# Patient Record
Sex: Male | Born: 1960 | Race: White | Hispanic: No | Marital: Single | Smoking: Former smoker
Health system: Southern US, Community
[De-identification: ages and names within clinical notes are randomized; demographics above are authoritative.]

## PROBLEM LIST (undated history)

## (undated) ENCOUNTER — Emergency Department (HOSPITAL_COMMUNITY): Discharge status: Absent without leave | Payer: Medicare Other

## (undated) DIAGNOSIS — N3090 Cystitis, unspecified without hematuria: Secondary | ICD-10-CM

## (undated) DIAGNOSIS — F99 Mental disorder, not otherwise specified: Secondary | ICD-10-CM

## (undated) DIAGNOSIS — D649 Anemia, unspecified: Secondary | ICD-10-CM

## (undated) DIAGNOSIS — R1310 Dysphagia, unspecified: Secondary | ICD-10-CM

## (undated) DIAGNOSIS — F259 Schizoaffective disorder, unspecified: Secondary | ICD-10-CM

## (undated) DIAGNOSIS — F202 Catatonic schizophrenia: Secondary | ICD-10-CM

## (undated) DIAGNOSIS — F319 Bipolar disorder, unspecified: Secondary | ICD-10-CM

## (undated) DIAGNOSIS — E876 Hypokalemia: Secondary | ICD-10-CM

## (undated) DIAGNOSIS — N400 Enlarged prostate without lower urinary tract symptoms: Secondary | ICD-10-CM

## (undated) HISTORY — DX: Catatonic schizophrenia: F20.2

## (undated) HISTORY — PX: TONSILLECTOMY: SUR1361

---

## 1999-02-20 ENCOUNTER — Inpatient Hospital Stay (HOSPITAL_COMMUNITY): Admission: AD | Admit: 1999-02-20 | Discharge: 1999-02-24 | Payer: Self-pay | Admitting: *Deleted

## 2000-02-26 ENCOUNTER — Emergency Department (HOSPITAL_COMMUNITY): Admission: EM | Admit: 2000-02-26 | Discharge: 2000-02-26 | Payer: Self-pay | Admitting: Emergency Medicine

## 2000-06-30 ENCOUNTER — Emergency Department (HOSPITAL_COMMUNITY): Admission: EM | Admit: 2000-06-30 | Discharge: 2000-07-01 | Payer: Self-pay | Admitting: Emergency Medicine

## 2000-07-03 ENCOUNTER — Emergency Department (HOSPITAL_COMMUNITY): Admission: EM | Admit: 2000-07-03 | Discharge: 2000-07-03 | Payer: Self-pay | Admitting: Emergency Medicine

## 2000-08-05 ENCOUNTER — Inpatient Hospital Stay (HOSPITAL_COMMUNITY): Admission: EM | Admit: 2000-08-05 | Discharge: 2000-08-29 | Payer: Self-pay | Admitting: *Deleted

## 2001-10-19 ENCOUNTER — Inpatient Hospital Stay (HOSPITAL_COMMUNITY): Admission: AD | Admit: 2001-10-19 | Discharge: 2001-10-30 | Payer: Self-pay | Admitting: Psychiatry

## 2002-08-26 ENCOUNTER — Inpatient Hospital Stay (HOSPITAL_COMMUNITY): Admission: EM | Admit: 2002-08-26 | Discharge: 2002-09-25 | Payer: Self-pay | Admitting: Psychiatry

## 2004-05-08 ENCOUNTER — Emergency Department (HOSPITAL_COMMUNITY): Admission: EM | Admit: 2004-05-08 | Discharge: 2004-05-08 | Payer: Self-pay | Admitting: Emergency Medicine

## 2006-03-30 ENCOUNTER — Inpatient Hospital Stay (HOSPITAL_COMMUNITY): Admission: AD | Admit: 2006-03-30 | Discharge: 2006-04-12 | Payer: Self-pay | Admitting: *Deleted

## 2006-03-31 ENCOUNTER — Ambulatory Visit: Payer: Self-pay | Admitting: *Deleted

## 2006-09-08 ENCOUNTER — Emergency Department (HOSPITAL_COMMUNITY): Admission: EM | Admit: 2006-09-08 | Discharge: 2006-09-08 | Payer: Self-pay | Admitting: Emergency Medicine

## 2006-11-23 ENCOUNTER — Emergency Department (HOSPITAL_COMMUNITY): Admission: EM | Admit: 2006-11-23 | Discharge: 2006-11-23 | Payer: Self-pay | Admitting: Emergency Medicine

## 2007-06-29 ENCOUNTER — Ambulatory Visit: Payer: Self-pay | Admitting: Family Medicine

## 2007-06-29 LAB — CONVERTED CEMR LAB
AST: 15 units/L (ref 0–37)
BUN: 11 mg/dL (ref 6–23)
Basophils Relative: 0 % (ref 0–1)
Calcium: 10 mg/dL (ref 8.4–10.5)
Chloride: 104 meq/L (ref 96–112)
Creatinine, Ser: 0.66 mg/dL (ref 0.40–1.50)
Eosinophils Absolute: 0.2 10*3/uL (ref 0.0–0.7)
HCT: 46.3 % (ref 39.0–52.0)
Hemoglobin: 15.5 g/dL (ref 13.0–17.0)
MCHC: 33.5 g/dL (ref 30.0–36.0)
MCV: 95.1 fL (ref 78.0–100.0)
Monocytes Absolute: 0.6 10*3/uL (ref 0.2–0.7)
Monocytes Relative: 5 % (ref 3–11)
Neutro Abs: 8.8 10*3/uL — ABNORMAL HIGH (ref 1.7–7.7)
RBC: 4.87 M/uL (ref 4.22–5.81)
Valproic Acid Lvl: 59.4 ug/mL (ref 50.0–100.0)

## 2007-09-20 ENCOUNTER — Emergency Department (HOSPITAL_COMMUNITY): Admission: EM | Admit: 2007-09-20 | Discharge: 2007-09-20 | Payer: Self-pay | Admitting: Emergency Medicine
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
## 2011-01-28 NOTE — H&P (Signed)
[facility]  Patient:    Franklin Ayers, MITCHAM Visit Number: [id number] MRN: [id number]          Service Type: PSY Location: 400 0499 [id number] Attending Physician:  Jeanice Lim Dictated by:   Young Berry Scott, N.P. Admit Date:  10/19/2001                     Psychiatric Admission Assessment  DATE OF ADMISSION:  October 19, 2001.  IDENTIFYING INFORMATION:  This is a 50 year old single Caucasian male who is an involuntary admission.  HISTORY OF THE PRESENT ILLNESS:  This patient with a long history of undifferentiated schizophrenia was committed by his mother, who petitioned because the patient has not been eating and not been sleeping and refusing to take his medications.  According to reports to the assessment team, his mother had reported that these symptoms had gradually been getting worse over the past 6 or 8 weeks, but the patient refused to go back on his medications and was generally pacing a lot at home and was behaving quite differently than his usual baseline.  During the commitment process, the patient was gesturing wildly.  His mood seemed somewhat elevated, and his thinking seemed disorganized and he was quite incoherent.  The patient was also tearful when talking about his father and mothers divorce, and his father has now moved away to [place], [place] and he does not get to see him very frequently. Today, the patient denies any suicidal ideation or homicidal ideation.  He is cooperative with the exam, although his mood seems quite elevated.  The patient denies any suicidal ideation or homicidal ideation.  He denies any auditory hallucinations.  PAST PSYCHIATRIC HISTORY:  The patient is followed by [facility] by Dr. Hortencia Pilar.  This is the patients third admission to [facility], with his last one in 2001.  He has a history of schizophrenia, undifferentiated.  SOCIAL HISTORY:  The patient was  educated through some college, at which time he decompensated and had to drop out.  He has his own apartment where he lives, but spends a considerable amount of time with his mother who lives here in town.  Her name is Gabriel Rung and she is supportive of him and assists him with shopping and various activities of daily living.  The patient is single, never married.  He has no children.  FAMILY HISTORY:  Remarkable for an aunt with a history of bipolar illness.  ALCOHOL AND DRUG HISTORY:  The patients mother during the assessment process denied that there had been any current substance abuse.  The patient does drink a beer occasionally and had a beer approximately 2 days ago.  The patient has been smoking increasing amounts lately, generally has been smoking up to about 2 packs per day of cigarettes.  PAST MEDICAL HISTORY:  Patients primary care Kayd Launer is unclear at this time.  No evidence of any medical problems reported and he has no somatic complaints today.  MEDICATIONS:  Depakote ER 1000 mg p.o. q.h.s., Risperdal 3 mg p.o. b.i.d. and Haldol 2 mg q.h.s.  The patient has been noncompliant with medications for an unknown period of time.  DRUG ALLERGIES:  PENICILLIN.  POSITIVE PHYSICAL FINDINGS:  The patients full PE is currently pending.  He has no somatic complaints at this time.  On admission to the unit, his temp is 97.8, pulse 90, respirations 18, blood pressure 134/91.  Patients valproic acid level is less than  10.  His CBC is within normal limits, with a WBC of 10.0, hemoglobin of 16.2, hematocrit 47.8, MCV 92.1, and platelets of 251. The patients metabolic panel is also within normal limits.  Electrolytes are normal.  BUN 18, creatinine 0.9.  SGOT is 15, SGPT 15.  Thyroid panel reveals a TSH of 1.535, within normal limits, and a free T3 which mildly elevated at 4.3.  The patients urine drug screen and routine urinalysis is currently pending.  MENTAL STATUS EXAMINATION:   This is a healthy appearing male who is no acute distress.  He does have some mild motor restlessness and displays pill rolling movements of his left hand constantly throughout the interview.  He has been pacing in the hallway and is wearing a winter coat.  Affect quite bright, and he greets me with a big smile and greets me by name.  The patients speech is rapid and he is fairly constantly talking.  His mood is mildly elevated. Thought process is fairly logical and sequential and he is relatively clear. He has no evidence of suicidal or homicidal ideations today, no auditory or visual hallucinations.  His responses are generally appropriate.  He has difficulty with remembering time frames and states that he has been taking his medications, in contrast to what his mother says.  He is not a very reliable historian.  Cognitively, he is intact and oriented x 3.  ADMISSION DIAGNOSES: Axis I:    1. Schizophrenia, undifferentiated.            2. Rule out schizoaffective disorder. Axis II:   Deferred. Axis III:  Tardive dyskinesia and akathisia. Axis IV:   Moderate problems with the primary support group, with the            patient being stressed and upset over his parents recent            divorce and his fathers move to [place]. Axis V:    Current 38, past year 89.  INITIAL PLAN OF CARE:  Involuntarily admit the patient to treat his agitation and improve his reality testing and evaluate his overall status.  We will discontinue his Haldol at h.s. because of his akathisia and tardive dyskinesia and discontinue his Haldol altogether.  We will instead try him on some Seroquel 100 mg p.o. q.h.s. and that may be repeated x 1, and we will restart his routine dose of Depakote.  Since his valproic acid level was less than 10 it is obvious he has been noncompliant with his medications, and we will evaluate him from there in terms of what we can do to impact his tardive dyskinesia and help him to clear  up and even his mood.  ESTIMATED LENGTH OF STAY:  5 to 6 days. Dictated by:   Young Berry Scott, N.P. Attending Physician:  Jeanice Lim  DD:  10/20/01 TD:  10/20/01 Job: [id number]
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
## 2011-01-28 NOTE — H&P (Signed)
[facility]  Patient:    Franklin Ayers, Franklin Ayers Visit Number: [id number] MRN: [id number]          Service Type: PSY Location: 400 0499 01 Attending Physician:  Jeanice Lim Dictated by:   Young Berry Scott, R.N. N.P. Admit Date:  10/19/2001                           History and Physical  DATE OF EXAMINATION: October 20, 2001, at 3:00 p.m.  REVIEW OF SYSTEMS:  This patient with chronic undifferentiated schizophrenia reports no somatic complaints at this time and he is quite clear and coherent today.  His mother had complained that he had been agitated and pacing, smoking more cigarettes than usual up to two or three packs per day, and had been refusing his medications.  According to the information that we have and from looking at his past medical records, he has no remarkable medical problems in his history.  He does have an allergy to PENICILLIN, apparently. Today he states he gets some muscle stiffness in his right foot; however, he denies any somatic complaints at all today and he denies that he has any insomnia.  He has been denying that he has any noncompliance with his medications, states that he has been taking them regularly.  The patient today denies any history of lung diseases, no history of asthma, wheezing, or shortness of breath.  He denies any chest pain.  He denies any abdominal complaints and states his bowels are regular and his appetite is satisfactory. There has been no apparent weight loss; although at one point there was a report of a 40 pound weight loss he appears to be his normal weight and weight on admission was 170 pounds.  This is in comparison with his admission weight August 05, 2000, of 148 pounds and his discharge weight of 158 pounds on August 25, 2000.  PHYSICAL EXAMINATION:  GENERAL:  This is a generally healthy appearing Caucasian male with a quick smile.  He is dressed inappropriately in a ski jacket and  then later on is wandering around in just a hospital gown in his tennis shoes.  He is wearing his glasses.  Generally his greeting is appropriate.  His breath and person smell of cigarette smoke.  Hygiene is adequate.  VITAL SIGNS:  On admission to the unit, temperature pulse respirations blood pressure  SKIN:  Pale in tone, medium texture, no remarkable lesions or rashes noted.  HEAD:  Normocephalic.  Hair is clipped short and is scattered with gray.  EENT:  The patient does wear corrective lenses.  PERRLA.  Hearing is intact to normal voice.  Oropharynx: Noninjected.  Tongue is midline without fasciculations.  CARDIOVASCULAR:  S1 and S2 heard, no clicks, murmurs, or gallops, regular rate.  LUNGS:  Clear to auscultation, no wheezes heard throughout, no cough evident.  ABDOMEN:  Soft and nontender, generally flat.  GENITALIA:  Deferred.  MUSCULOSKELETAL:  Posture: Upright.  Gait: Generally normal although the patient is pacing considerably.  NEUROLOGIC:  The patient has displayed some repetitive pill rolling movements of his left hand during the exam today and he has some mild motor restlessness.  Cranial nerves II-XII are intact.  EOMs: Intact without nystagmus.  Romberg is without findings.  Deep tendon reflexes are 1+/5 and are generally symmetrical.  EXTREMITIES:  Feet are not examined today because the patient is declining to take his shoes off, wants to get up  and leave now so we have had to cut the exam short.  We will attempt to examine his feet later. Dictated by:   Young Berry Scott, R.N. N.P. Attending Physician:  Jeanice Lim DD:  10/20/01 TD:  10/20/01 Job: [id number]

## 2011-01-28 NOTE — Discharge Summary (Signed)
NAME:  Franklin Ayers, Franklin Ayers NO.:  [id number]   MEDICAL RECORD NO.:  [id number]          PATIENT TYPE:  IPS   LOCATION:  0406                          [facility]ILITY[facility]   PHYSICIAN:  Jasmine Pang, M.D. DATE OF BIRTH:  Sep 02, 1961   DATE OF ADMISSION:  03/30/2006  DATE OF DISCHARGE:  04/12/2006                                 DISCHARGE SUMMARY   STAT DISCHARGE SUMMARY   IDENTIFYING INFORMATION:  Patient is a 50 year old single Caucasian male,  who was admitted on an involuntary basis on 03/30/2006 to my service.   HISTORY OF PRESENT ILLNESS:  According to the history on petition, the paper  stated he was paranoid and agitated, irritable, and dangerous.  Patient  believes people are trying to poison his food, he has been talking to  himself, he has been responding to voices.  He has been noncompliant with  his medication.   PAST PSYCHIATRIC HISTORY:  Patient was here in 2004.  He is seen at the  [facility] by Dr. Hortencia Pilar.   FAMILY HISTORY:  None known.   ALCOHOL HISTORY:  No current alcohol.   DRUG USE:  Unclear.   PAST MEDICAL HISTORY/MEDICAL PROBLEMS:  None.   MEDICATIONS:  1.  Depakote ER 500 mg p.o. b.i.d.  2.  Risperdal 2 mg p.o. q.a.m.  3.  Trazodone 200 mg p.o. q.h.s.   ALLERGIES:  PENICILLIN.   POSITIVE PHYSICAL FINDINGS:  Patient appeared well-nourished, without acute  chronic physical problems.  He allowed a brief physical exam, which was  within normal limits.   LABORATORY DATA:  Patient is refusing labs.   HOSPITAL COURSE:  Upon admission, patient was started on Risperdal M-Tabs 2  mg p.o. now times one dose, then Risperdal M-Tabs 2 mg p.o. q.h.s., Depakote  ER 750 mg p.o. q.h.s., Klonopin 200 mg p.o. q.4h. p.r.n. agitation - he was  ordered a Klonopin 2 mg one time dose now, Cogentin 1 mg p.o. b.i.d.  On  03/30/2006, patient was ordered Geodon 10 mg IM to be used only for  emergency agitation.  On 03/31/2006, patient was started on  Ambien 10 mg  p.o. q.h.s. p.r.n.  On 04/02/2006, patient was started on Haldol 5 mg IM,  may give 1-2 hours; Ativan 2 mg IM times one.  On 04/06/2006, patient was  started on Geodon as a forced med IM now, may repeat times one if still  agitated and manic.  He was also started on Geodon 60 mg p.o. b.i.d.  On  04/12/2006, patient's Risperdal was discontinued.   Throughout most of the hospitalization, patient refused his medications.  He  did initially take some medicines and on one day was so agitated that I was  able to force medications.  However, most of the time here, he has refused  any medication.  He was frequently agitated and angry.  He would pace up and  down the hall.  He was responding to internal stimuli, he was very reserved  and guarded, just one-word answers.  He was very isolative and withdrawn.  This was his affect throughout the hospitalization.  On  04/06/2006, I was  able to force Geodon 20 mg IM times one because he had gotten increasingly  agitated and was pulling staff's paperwork and pulling at staff.  He was  also touching patients and repeating what they said, which was annoying  them.  He was getting more manic and out of control.  He was sedate after  that one dose but still refused medications.  There was no significant  change of patient's mental status during the hospitalization.  There was no  improvement in his mental status as well and patient refused any medications  after the first two days.   DISCHARGE DIAGNOSES:  Axis I:  Schizoaffective disorder, bipolar type.  None.  Axis III:  None.  Axis IV:  Moderate (burden of the illness and other psychosocial problems).  Axis V:  GAF, current was 30; GAF upon admission was 30; GAF highest past  year was 50.   DISCHARGE INSTRUCTIONS:  No specific activity level or dietary restrictions.   DISCHARGE MEDICATIONS:  1.  Depakote ER 250 mg at bedtime.  2.  Cogentin 1 mg p.o. b.i.d.  3.  Geodon 60 mg b.i.d.  4.   Klonopin 2 mg every 4 hours as needed for anxiety.  5.  Ambien 10 mg at bedtime if needed.  However, as indicated above, patient      is refusing all medications.   POST-HOSPITAL CARE PLANS:  [facility] is [facility] for further evaluation and treatment.      Jasmine Pang, M.D.  Electronically Signed     BHS/MEDQ  D:  04/12/2006  T:  04/12/2006  Job:  [id number]
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
## 2011-01-28 NOTE — Discharge Summary (Signed)
[facility]  Patient:    Franklin Ayers, Franklin Ayers Visit Number: [id number] MRN: [id number]          Service Type: PSY Location: 400 [id number] 02 Attending Physician:  Jeanice Lim Dictated by:   Jeanice Lim, M.D. Admit Date:  10/19/2001 Discharge Date: 10/30/2001                             Discharge Summary  IDENTIFYING DATA:  This is a 50 year old single Caucasian male involuntarily admitted with a long history of undifferentiated schizophrenia, petitioned by his mother due to the patient not eating, not sleeping, and being unable to care for himself nor think clearly.  ADMISSION MEDICATIONS:  Depakote ER 1000 mg q.h.s., Risperdal 3 mg b.i.d. and Haldol 2 mg q.h.s.  The patient has been noncompliant with medications for an unknown period of time.  ALLERGIES:  PENICILLIN.  PHYSICAL EXAMINATION:  Essentially within normal limits except for a slightly elevated blood pressure, neurologically nonfocal.  ROUTINE ADMISSION LABS:  Essentially within normal limits, including TSH and liver function.  Urine drug screen was negative.  MENTAL STATUS EXAMINATION:  Healthy-appearing male in no acute distress, with mild psychomotor restlessness, displaying pill rolling movements with the left hand, pacing at times.  Mood mostly indifferent, affect somewhat bright, with a big smile.  Patients speech was somewhat rapid and pressured.  Thought process was mostly goal directed.  Thought content negative for suicidal or homicidal ideation.  The patient denied hallucinations, but appeared to lack insight regarding the severity of mental illness and the need for compliance with medications.  Judgment and insight were limited.  ADMISSION DIAGNOSES: Axis I:    Schizophrenia, undifferentiated. Axis II:   None. Axis III:  Tardive dyskinesia. Axis IV:   Moderate problems with primary support. Axis V:    35/70.  HOSPITAL COURSE:  The patient was admitted and routine  p.r.n. medications were ordered, resumed on Risperdal, Depakote, Haldol, Cogentin, Ativan, and Seroquel p.r.n.  Risperdal was titrated, as well as Depakote to stabilize mood and target psychotic symptoms, and weight was monitored to document that the patient was eating.  The patient was also monitored for p.o. intake.  The patient tolerated medication changes well without side effects, reporting positive response.  CONDITION ON DISCHARGE:  Markedly improved.  Mood more euthymic, affect brighter, thought process goal directed.  Thought content negative for overt psychotic symptoms or dangerous ideation.  The patient reported motivation to be compliant with follow up plan.  DISCHARGE MEDICATIONS: 1. Cogentin 2 mg b.i.d. 2. Ambien 10 mg q.h.s. 3. Risperdal 2 mg 1-1/2 in the morning, 1 at 12 p.m. and 1-1/2 q.h.s. 4. Depakote ER 250 mg q.a.m. and 6 q.h.s.  DISPOSITION:  The patient was discharged to follow up with [facility] on Thursday, February 20 at 11:30 a.m.  DISCHARGE DIAGNOSES: Axis I:    Schizophrenia, undifferentiated. Axis II:   None. Axis III:  Tardive dyskinesia. Axis IV:   Moderate problems with primary support. Axis V:    Global assessment of function on discharge was 50. Dictated by:   Jeanice Lim, M.D. Attending Physician:  Jeanice Lim DD:  12/05/01 TD:  12/06/01 Job: [id number]

## 2012-06-21 ENCOUNTER — Emergency Department (HOSPITAL_COMMUNITY)
Admission: EM | Admit: 2012-06-21 | Discharge: 2012-06-21 | Disposition: A | Discharge status: Home / Self Care | Payer: Medicare Other | Attending: Emergency Medicine | Admitting: Emergency Medicine

## 2012-06-21 ENCOUNTER — Emergency Department (HOSPITAL_COMMUNITY): Payer: Medicare Other

## 2012-06-21 ENCOUNTER — Other Ambulatory Visit: Payer: Self-pay

## 2012-06-21 ENCOUNTER — Encounter (HOSPITAL_COMMUNITY): Payer: Self-pay | Admitting: Emergency Medicine

## 2012-06-21 DIAGNOSIS — R51 Headache: Secondary | ICD-10-CM | POA: Insufficient documentation

## 2012-06-21 DIAGNOSIS — S01309A Unspecified open wound of unspecified ear, initial encounter: Secondary | ICD-10-CM | POA: Insufficient documentation

## 2012-06-21 DIAGNOSIS — R55 Syncope and collapse: Secondary | ICD-10-CM

## 2012-06-21 DIAGNOSIS — S01319A Laceration without foreign body of unspecified ear, initial encounter: Secondary | ICD-10-CM

## 2012-06-21 DIAGNOSIS — W1809XA Striking against other object with subsequent fall, initial encounter: Secondary | ICD-10-CM | POA: Insufficient documentation

## 2012-06-21 DIAGNOSIS — F79 Unspecified intellectual disabilities: Secondary | ICD-10-CM | POA: Insufficient documentation

## 2012-06-21 DIAGNOSIS — F17200 Nicotine dependence, unspecified, uncomplicated: Secondary | ICD-10-CM | POA: Insufficient documentation

## 2012-06-21 DIAGNOSIS — S0993XA Unspecified injury of face, initial encounter: Secondary | ICD-10-CM | POA: Insufficient documentation

## 2012-06-21 HISTORY — DX: Mental disorder, not otherwise specified: F99

## 2012-06-21 LAB — BASIC METABOLIC PANEL
BUN: 7 mg/dL (ref 6–23)
CO2: 24 mEq/L (ref 19–32)
Chloride: 100 mEq/L (ref 96–112)
Glucose, Bld: 135 mg/dL — ABNORMAL HIGH (ref 70–99)
Potassium: 3.5 mEq/L (ref 3.5–5.1)

## 2012-06-21 LAB — URINALYSIS, ROUTINE W REFLEX MICROSCOPIC
Leukocytes, UA: NEGATIVE
Nitrite: NEGATIVE
Protein, ur: NEGATIVE mg/dL
Specific Gravity, Urine: 1.007 (ref 1.005–1.030)
Urobilinogen, UA: 0.2 mg/dL (ref 0.0–1.0)

## 2012-06-21 LAB — CBC WITH DIFFERENTIAL/PLATELET
Hemoglobin: 13.5 g/dL (ref 13.0–17.0)
Lymphocytes Relative: 29 % (ref 12–46)
Lymphs Abs: 2.4 10*3/uL (ref 0.7–4.0)
MCH: 31.3 pg (ref 26.0–34.0)
Monocytes Relative: 8 % (ref 3–12)
Neutro Abs: 4.9 10*3/uL (ref 1.7–7.7)
Neutrophils Relative %: 60 % (ref 43–77)
RBC: 4.31 MIL/uL (ref 4.22–5.81)

## 2012-06-21 MED ORDER — HYDROCODONE-ACETAMINOPHEN 5-325 MG PO TABS: 1.0000 | ORAL_TABLET | ORAL | Status: DC | PRN

## 2012-06-21 MED ORDER — OXYCODONE-ACETAMINOPHEN 5-325 MG PO TABS
1.0000 | ORAL_TABLET | Freq: Once | ORAL | Status: AC
  Administered 2012-06-21: 1 via ORAL
  Filled 2012-06-21: qty 1

## 2012-06-21 NOTE — ED Notes (Signed)
Per EMS pt transported from [facility] assisted living, EMS states pt slide in soda on the floor hitting L ear on knob on cabinet. Partial amputation per EMS. Bleeding controlling by dressing.

## 2012-06-21 NOTE — Consult Note (Signed)
Reason for Consult:Left ear laceration Referring Physician: ER  Darrold Bezek is an 51 y.o. male.  HPI: 51 year old male who lives in a group home was evidently drinking a soda earlier this evening and may have slipped, striking his left ear against a doorknob.  This resulted in a severe laceration to the left ear so he was brought to the ER via EMS.  No other significant injuries identified.  No complaints.  Past Medical History  Diagnosis Date  . Mental disorder     History reviewed. No pertinent past surgical history.  No family history on file.  Social History:  reports that he has been smoking.  He does not have any smokeless tobacco history on file. He reports that he drinks alcohol. He reports that he does not use illicit drugs.  Allergies: No Known Allergies  Medications: I have reviewed the patient's current medications.  No results found for this or any previous visit (from the past 48 hour(s)).  Ct Head Wo Contrast  06/21/2012  *RADIOLOGY REPORT*  Clinical Data: 51 year old male slip, fall, left ear struck door knob.  Injury.  CT HEAD WITHOUT CONTRAST  Technique:  Contiguous axial images were obtained from the base of the skull through the vertex without contrast.  Comparison: None.  Findings: Occasional mild paranasal sinus mucosal thickening or small mucous retention cyst.  Mastoids and tympanic cavities are clear. Visualized orbit soft tissues are within normal limits. There may be a posterior vertex scalp laceration (image 47), but this may be chronic.  There is dressing material about the left pinna.  There is subcutaneous gas anterior to the left and mildly involving the visible left parotid space.  Caudal to that there are several round left parotid space nodules, the largest partially visible is 10 mm.  There are also occasional scalp dermal/subcutaneous cysts.  The left EAC appears normal. No acute osseous abnormality identified.  Cerebral volume is within normal  limits for age.  No midline shift, ventriculomegaly, mass effect, evidence of mass lesion, intracranial hemorrhage or evidence of cortically based acute infarction.  Gray-white matter differentiation is within normal limits throughout the brain.  No suspicious intracranial vascular hyperdensity.  IMPRESSION: 1. Normal noncontrast CT appearance of the brain. 2.  Soft tissue injury about the left pinna.  Small volume subcutaneous gas in the left parotid space. Underlying left middle year and surrounding osseous structures appear intact. 3.  Partially visible left parotid space nodules probably are benign, such as Warthin tumors, pleomorphic adenoma. These would best be characterized with neck or face MRI (contrast preferred).   Original Report Authenticated By: Harley Hallmark, M.D.     Review of Systems  All other systems reviewed and are negative.   Blood pressure 138/71, pulse 75, temperature 98.2 F (36.8 C), temperature source Oral, resp. rate 18, SpO2 100.00%. Physical Exam  Constitutional: He is oriented to person, place, and time. He appears well-developed and well-nourished. No distress.  HENT:  Head: Normocephalic.  Right Ear: External ear normal.  Nose: Nose normal.  Mouth/Throat: Oropharynx is clear and moist.       No facial injury except left ear laceration extending from helical root inferiorly in preauricular sulcus, through tragus, and down to the lobe with the lobe lacerated from the rest of the auricle except for a thin connection of skin to the preauricular area.  The laceration is deep into the subcutaneous structures but not into the parotid gland or deeper.  TMs intact.  MEs aerated.  Eyes: Conjunctivae normal and EOM are normal. Pupils are equal, round, and reactive to light.  Neck: Normal range of motion. Neck supple.  Cardiovascular: Normal rate.   Respiratory: Effort normal.  GI:       Did not examine.  Genitourinary:       Did not examine.  Musculoskeletal: Normal  range of motion.  Neurological: He is alert and oriented to person, place, and time. No cranial nerve deficit.  Skin: Skin is warm and dry.  Psychiatric: He has a normal mood and affect. His behavior is normal. Judgment and thought content normal.    Assessment/Plan: Left auricular laceration. The laceration will be closed at the bedside.  The earlobe segment was nearly completely avulsed.  With a small segment of connecting skin, I recommended closing the earlobe lacerations replacing it on the auricle with the hope that there will be enough blood supply for the segment to survive.  Close follow-up will be arranged.  Wound care with half strength peroxide and antibiotic ointment twice daily to the lacerations will be recommended.  A non-stick dressing will be applied.  Home health nursing will be arranged to aid with dressing changes and wound care.  Earvin Blazier 06/21/2012, 2:29 AM

## 2012-06-21 NOTE — ED Notes (Signed)
MD at bedside for ear repair.

## 2012-06-21 NOTE — ED Notes (Signed)
[id number]<JY> Expected date:<BR> Expected time:<BR> Means of arrival:<BR> Comments:<BR> EMS/51 year old male-fall with partial ear amputation

## 2012-06-21 NOTE — Procedures (Signed)
Preop diagnosis: 13 cm complex left auricular laceration Postop diagnosis: Same Procedure: Complex closure of 13 cm left auricular laceration Surgeon: Jenne Pane Anesth: Local with 1% lidocaine Compl: None Indication: 51 year old male slipped earlier this evening and struck his left ear on a doorknob lacerating the ear in the preauricular sulcus and nearly avulsing the earlobe. Description:  After obtaining informed consent, the left ear and surrounding skin was prepped and draped in sterile fashion with Betadyne.  The skin edges of the lacerations were injected with local anesthesia sparing the area around the skin connection to the earlobe and sparing the earlobe segment.  The wounds were copiously irrigated with saline and the wounds explored.  The preauricular laceration was closed in the subcutaneous layer with 4-0 Vicryl in a simple, interrupted fashion.  The earlobe was repositioned and closed similarly.  Deep sutures were not placed near the skin connection to the earlobe. The skin layer was then closed with 5-0 Nylon in simple, running segments and with a few interrupted sutures.  Once again, the skin around the skin connection to the earlobe was not closed with skin sutures.  Upon completion, the earlobe segment blanched well and had a slow capillary refill of about 3 seconds.

## 2012-06-21 NOTE — Progress Notes (Signed)
[facility] ED CM spoke with Tresa Endo SW about providing home health services for pt Needing dressing changes to his left ear.  Cm reviewed EPIC information, spoke with pt and his male guardian at the bedside and spoke with [facility]. Pt confirms pcp is elizabeth dewey.  Pt confirms having a primary caregiver at the facility.  [facility] chose [facility]. Confirmed with [facility] staff that [facility] is their preferred home health care agency.  CM completed referral with Darl Pikes of [facility] Pt provided with [facility] contact information, is aware a primary caregiver is needed for teaching from [facility] and pt is aware he will be called by [facility] staff and seen on 06/22/12 Cm signing off

## 2012-06-21 NOTE — ED Notes (Signed)
Pt and mother verbalized understanding of discharge instructions. Pt assessment has not changed since am.  Pt has no signs of bleeding from ear at discharge. Pt given prescription and d/c forms. Pt pain at discharge is 0.

## 2012-06-21 NOTE — ED Provider Notes (Signed)
History     CSN: [id number]  Arrival date & time 06/21/12  0025   First MD Initiated Contact with Patient 06/21/12 0046      Chief Complaint  Patient presents with  . Ear Injury   HPI  History provided by patient and EMS. Patient is a 51 year old male with history of mental retardation who presents from an assisted living manner after her fall and left ear injury. Patient states he was drinking a soda in the next thing he remembers he was on the floor bleeding. Patient believes he did not get enough "air" while drinking. EMS reports that staff believes patient hit left head and ear on a nearby doorknob. Patient has significant bleeding and damage to his left ear. This was bandaged the patient was transported to the emergency room. Patient was awake and alert per baseline immediately following the accident. There was no post ictal confusion. There was no compulsions reported. No urinary or fecal incontinence. Patient has no prior history of seizure disorder. Patient denies having any chest pain, heart palpitations prior to the event.    Past Medical History  Diagnosis Date  . Mental disorder     History reviewed. No pertinent past surgical history.  No family history on file.  History  Substance Use Topics  . Smoking status: Current Every Day Smoker  . Smokeless tobacco: Not on file  . Alcohol Use: Yes     occasional      Review of Systems  Gastrointestinal: Negative for nausea and vomiting.  Neurological: Positive for syncope and headaches. Negative for dizziness, weakness, light-headedness and numbness.  Psychiatric/Behavioral: Negative for confusion.    Allergies  Review of patient's allergies indicates no known allergies.  Home Medications  No current outpatient prescriptions on file.  BP 138/71  Pulse 75  Temp 98.2 F (36.8 C) (Oral)  Resp 18  SpO2 100%  Physical Exam  Nursing note and vitals reviewed. Constitutional: He is oriented to person, place, and  time. He appears well-developed and well-nourished. No distress.  HENT:  Head: Normocephalic.       Complicated laceration to the left ear. Laceration extends through the cartilage with almost complete avulsion of the inferior aspect. Bleeding is controlled.  Eyes: EOM are normal. Pupils are equal, round, and reactive to light.  Neck: Normal range of motion. Neck supple.       No cervical midline tenderness.  Cardiovascular: Normal rate and regular rhythm.   Pulmonary/Chest: Effort normal and breath sounds normal.  Abdominal: Soft.  Neurological: He is alert and oriented to person, place, and time. He has normal strength. No cranial nerve deficit or sensory deficit.       Movement in all extremities equal bilaterally  Skin: Skin is warm.  Psychiatric: He has a normal mood and affect. His behavior is normal.    ED Course  Procedures   Results for orders placed during the hospital encounter of 06/21/12  CBC WITH DIFFERENTIAL      Component Value Range   WBC 8.1  4.0 - 10.5 K/uL   RBC 4.31  4.22 - 5.81 MIL/uL   Hemoglobin 13.5  13.0 - 17.0 g/dL   HCT 78.2  95.6 - 21.3 %   MCV 91.2  78.0 - 100.0 fL   MCH 31.3  26.0 - 34.0 pg   MCHC 34.4  30.0 - 36.0 g/dL   RDW 08.6  57.8 - 46.9 %   Platelets 243  150 - 400 K/uL   Neutrophils  Relative 60  43 - 77 %   Neutro Abs 4.9  1.7 - 7.7 K/uL   Lymphocytes Relative 29  12 - 46 %   Lymphs Abs 2.4  0.7 - 4.0 K/uL   Monocytes Relative 8  3 - 12 %   Monocytes Absolute 0.6  0.1 - 1.0 K/uL   Eosinophils Relative 3  0 - 5 %   Eosinophils Absolute 0.2  0.0 - 0.7 K/uL   Basophils Relative 1  0 - 1 %   Basophils Absolute 0.1  0.0 - 0.1 K/uL  BASIC METABOLIC PANEL      Component Value Range   Sodium 138  135 - 145 mEq/L   Potassium 3.5  3.5 - 5.1 mEq/L   Chloride 100  96 - 112 mEq/L   CO2 24  19 - 32 mEq/L   Glucose, Bld 135 (*) 70 - 99 mg/dL   BUN 7  6 - 23 mg/dL   Creatinine, Ser 1.61  0.50 - 1.35 mg/dL   Calcium 8.9  8.4 - 09.6 mg/dL   GFR  calc non Af Amer >90  >90 mL/min   GFR calc Af Amer >90  >90 mL/min  URINALYSIS, ROUTINE W REFLEX MICROSCOPIC      Component Value Range   Color, Urine YELLOW  YELLOW   APPearance CLEAR  CLEAR   Specific Gravity, Urine 1.007  1.005 - 1.030   pH 7.0  5.0 - 8.0   Glucose, UA NEGATIVE  NEGATIVE mg/dL   Hgb urine dipstick NEGATIVE  NEGATIVE   Bilirubin Urine NEGATIVE  NEGATIVE   Ketones, ur NEGATIVE  NEGATIVE mg/dL   Protein, ur NEGATIVE  NEGATIVE mg/dL   Urobilinogen, UA 0.2  0.0 - 1.0 mg/dL   Nitrite NEGATIVE  NEGATIVE   Leukocytes, UA NEGATIVE  NEGATIVE      Ct Head Wo Contrast  06/21/2012  *RADIOLOGY REPORT*  Clinical Data: 51 year old male slip, fall, left ear struck door knob.  Injury.  CT HEAD WITHOUT CONTRAST  Technique:  Contiguous axial images were obtained from the base of the skull through the vertex without contrast.  Comparison: None.  Findings: Occasional mild paranasal sinus mucosal thickening or small mucous retention cyst.  Mastoids and tympanic cavities are clear. Visualized orbit soft tissues are within normal limits. There may be a posterior vertex scalp laceration (image 47), but this may be chronic.  There is dressing material about the left pinna.  There is subcutaneous gas anterior to the left and mildly involving the visible left parotid space.  Caudal to that there are several round left parotid space nodules, the largest partially visible is 10 mm.  There are also occasional scalp dermal/subcutaneous cysts.  The left EAC appears normal. No acute osseous abnormality identified.  Cerebral volume is within normal limits for age.  No midline shift, ventriculomegaly, mass effect, evidence of mass lesion, intracranial hemorrhage or evidence of cortically based acute infarction.  Gray-white matter differentiation is within normal limits throughout the brain.  No suspicious intracranial vascular hyperdensity.  IMPRESSION: 1. Normal noncontrast CT appearance of the brain. 2.   Soft tissue injury about the left pinna.  Small volume subcutaneous gas in the left parotid space. Underlying left middle year and surrounding osseous structures appear intact. 3.  Partially visible left parotid space nodules probably are benign, such as Warthin tumors, pleomorphic adenoma. These would best be characterized with neck or face MRI (contrast preferred).   Original Report Authenticated By: Harley Hallmark, M.D.  1. Syncope   2. Ear lobe laceration       MDM  12:45AM patient seen and evaluated. Patient awake and alert no focal neuro deficits. Patient with complicated injury and laceration to left ear.  Patient discussed with attending physician. Will consult ENT.  Spoke with Dr. Jenne Pane with ENT. He'll come see patient to assess and repair injury.   Dr. Jenne Pane is seen patient repaired a year. He is concerned for patient's ability to have wound care over the ear. Recommends having consultation with social work to set up home nursing.    Date: 06/21/2012  Rate: 76  Rhythm: normal sinus rhythm  QRS Axis: normal  Intervals: normal  ST/T Wave abnormalities: normal  Conduction Disutrbances:none  Narrative Interpretation:   Old EKG Reviewed: none available        Angus Seller, [place] 06/21/12 [phone]

## 2012-06-21 NOTE — Progress Notes (Signed)
CSW consulted for patient admitted from [facility] (ph#: [phone]). CSW confirmed with Gywnn @ the [facility] that patient is ok to return today. CSW called Selena Batten [facility] to make her aware that patient will need Home Health for dressing changes to his ear. RN, Adela Lank ([id number]) aware. CSW signing off.   Unice Bailey, LCSW [facility] Clinical Social Worker

## 2012-06-22 NOTE — ED Provider Notes (Signed)
Medical screening examination/treatment/procedure(s) were performed by non-physician practitioner and as supervising physician I was immediately available for consultation/collaboration.  Omero Kowal, MD 06/22/12 1128 

## 2015-12-16 ENCOUNTER — Encounter (HOSPITAL_COMMUNITY): Payer: Self-pay | Admitting: Emergency Medicine

## 2015-12-16 ENCOUNTER — Emergency Department (HOSPITAL_COMMUNITY)
Admission: EM | Admit: 2015-12-16 | Discharge: 2015-12-17 | Disposition: A | Discharge status: Home / Self Care | Payer: Medicare Other | Attending: Emergency Medicine | Admitting: Emergency Medicine

## 2015-12-16 DIAGNOSIS — X58XXXA Exposure to other specified factors, initial encounter: Secondary | ICD-10-CM | POA: Diagnosis not present

## 2015-12-16 DIAGNOSIS — F259 Schizoaffective disorder, unspecified: Secondary | ICD-10-CM | POA: Diagnosis not present

## 2015-12-16 DIAGNOSIS — Y9389 Activity, other specified: Secondary | ICD-10-CM | POA: Insufficient documentation

## 2015-12-16 DIAGNOSIS — F17200 Nicotine dependence, unspecified, uncomplicated: Secondary | ICD-10-CM | POA: Diagnosis not present

## 2015-12-16 DIAGNOSIS — Y998 Other external cause status: Secondary | ICD-10-CM | POA: Insufficient documentation

## 2015-12-16 DIAGNOSIS — Y9289 Other specified places as the place of occurrence of the external cause: Secondary | ICD-10-CM | POA: Insufficient documentation

## 2015-12-16 DIAGNOSIS — S39012A Strain of muscle, fascia and tendon of lower back, initial encounter: Secondary | ICD-10-CM | POA: Insufficient documentation

## 2015-12-16 DIAGNOSIS — Z79899 Other long term (current) drug therapy: Secondary | ICD-10-CM | POA: Insufficient documentation

## 2015-12-16 DIAGNOSIS — M545 Low back pain: Secondary | ICD-10-CM | POA: Diagnosis present

## 2015-12-16 HISTORY — DX: Schizoaffective disorder, unspecified: F25.9

## 2015-12-16 NOTE — ED Notes (Signed)
Pt states he woke up this morning with lower back pain  Denies injury

## 2015-12-16 NOTE — ED Notes (Signed)
Pt has two knives locked up in security office

## 2015-12-17 DIAGNOSIS — S39012A Strain of muscle, fascia and tendon of lower back, initial encounter: Secondary | ICD-10-CM | POA: Diagnosis not present

## 2015-12-17 MED ORDER — IBUPROFEN 800 MG PO TABS: 800.0000 mg | ORAL_TABLET | Freq: Three times a day (TID) | ORAL | Status: DC

## 2015-12-17 MED ORDER — IBUPROFEN 800 MG PO TABS
800.0000 mg | ORAL_TABLET | Freq: Once | ORAL | Status: AC
  Administered 2015-12-17: 800 mg via ORAL
  Filled 2015-12-17: qty 1

## 2015-12-17 MED ORDER — METHOCARBAMOL 500 MG PO TABS: 500.0000 mg | ORAL_TABLET | Freq: Three times a day (TID) | ORAL | Status: DC | PRN

## 2015-12-17 NOTE — ED Provider Notes (Signed)
CSN: [id number]     Arrival date & time 12/16/15  2252 History  By signing my name below, I, Altamease Oiler, attest that this documentation has been prepared under the direction and in the presence of Orpah Greek, MD. Electronically Signed: Altamease Oiler, ED Scribe. 12/17/2015. 2:13 AM   Chief Complaint  Patient presents with  . Back Pain   The history is provided by the patient. No language interpreter was used.   Franklin Ayers is a 55 y.o. male with history of schizoaffective disorder who presents to the Emergency Department complaining of new, 6/10 in severity, intermittent lower back pain with onset yesterday morning. This pain is worse in the mornings and exacerbated by movement, especially position changes. He is concerned that his medication (Zyprexa, Depakote, and Seroquel) is affecting an organ. Pt denies any recent lifting or trauma but notes that he walks a lot. The pain does not radiate to the legs.    Past Medical History  Diagnosis Date  . Mental disorder   . Schizoaffective disorder [facility])    Past Surgical History  Procedure Laterality Date  . Tonsillectomy     Family History  Problem Relation Age of Onset  . Cancer Other   . Stroke Other    Social History  Substance Use Topics  . Smoking status: Current Every Day Smoker  . Smokeless tobacco: None  . Alcohol Use: Yes     Comment: occasional    Review of Systems  Musculoskeletal: Positive for back pain.  All other systems reviewed and are negative.  Allergies  Review of patient's allergies indicates no known allergies.  Home Medications   Prior to Admission medications   Medication Sig Start Date End Date Taking? Authorizing Provider  divalproex (DEPAKOTE ER) 500 MG 24 hr tablet Take 1,000 mg by mouth at bedtime.    Historical Provider, MD  HYDROcodone-acetaminophen (NORCO/VICODIN) 5-325 MG per tablet Take 1 tablet by mouth every 4 (four) hours as needed for pain. 06/21/12   Hazel Sams,  PA-C  ibuprofen (ADVIL,MOTRIN) 800 MG tablet Take 1 tablet (800 mg total) by mouth 3 (three) times daily. 12/17/15   Orpah Greek, MD  methocarbamol (ROBAXIN) 500 MG tablet Take 1 tablet (500 mg total) by mouth every 8 (eight) hours as needed for muscle spasms. 12/17/15   Orpah Greek, MD  OLANZapine (ZYPREXA) 10 MG tablet Take 10-20 mg by mouth 2 (two) times daily. Take 10mg  in the morning and 20mg  at bedtime    Historical Provider, MD  QUEtiapine (SEROQUEL) 50 MG tablet Take 50 mg by mouth at bedtime.    Historical Provider, MD   BP 159/97 mmHg  Pulse 113  Temp(Src) 98.1 F (36.7 C) (Oral)  Resp 20  SpO2 96% Physical Exam  Constitutional: He is oriented to person, place, and time. He appears well-developed and well-nourished. No distress.  HENT:  Head: Normocephalic and atraumatic.  Right Ear: Hearing normal.  Left Ear: Hearing normal.  Nose: Nose normal.  Mouth/Throat: Oropharynx is clear and moist and mucous membranes are normal.  Eyes: Conjunctivae and EOM are normal. Pupils are equal, round, and reactive to light.  Neck: Normal range of motion. Neck supple.  Cardiovascular: Regular rhythm, S1 normal and S2 normal.  Exam reveals no gallop and no friction rub.   No murmur heard. Pulmonary/Chest: Effort normal and breath sounds normal. No respiratory distress. He exhibits no tenderness.  Abdominal: Soft. Normal appearance and bowel sounds are normal. There is no hepatosplenomegaly. There is  no tenderness. There is no rebound, no guarding, no tenderness at McBurney's point and negative Murphy's sign. No hernia.  Musculoskeletal: Normal range of motion.  Right sided lumbar paraspinal tenderness and spasm   Neurological: He is alert and oriented to person, place, and time. He has normal strength. No cranial nerve deficit or sensory deficit. Coordination normal. GCS eye subscore is 4. GCS verbal subscore is 5. GCS motor subscore is 6.  Skin: Skin is warm, dry and intact. No  rash noted. No cyanosis.  Psychiatric: He has a normal mood and affect. His speech is normal and behavior is normal. Thought content normal.  Nursing note and vitals reviewed.   ED Course  Procedures (including critical care time) DIAGNOSTIC STUDIES: Oxygen Saturation is 96% on RA,  normal by my interpretation.    COORDINATION OF CARE: 2:05 AM Discussed treatment plan which includes pain management with pt at bedside and pt agreed to plan.  Labs Review Labs Reviewed - No data to display  Imaging Review No results found.   EKG Interpretation None      MDM   Final diagnoses:  Lumbar strain, initial encounter   Patient presents to the ER with musculoskeletal back pain. Examination reveals back tenderness without any associated neurologic findings. Patient's strength, sensation and reflexes were normal. As such, patient did not require any imaging or further studies. Patient was treated with analgesia.   I personally performed the services described in this documentation, which was scribed in my presence. The recorded information has been reviewed and is accurate.    Orpah Greek, MD 12/17/15 [phone]

## 2015-12-17 NOTE — Discharge Instructions (Signed)

## 2016-03-15 ENCOUNTER — Encounter (HOSPITAL_COMMUNITY): Payer: Self-pay | Admitting: *Deleted

## 2016-03-15 ENCOUNTER — Emergency Department (HOSPITAL_COMMUNITY)
Admission: EM | Admit: 2016-03-15 | Discharge: 2016-03-16 | Disposition: A | Discharge status: Home / Self Care | Payer: Medicare Other | Attending: Emergency Medicine | Admitting: Emergency Medicine

## 2016-03-15 DIAGNOSIS — Y9389 Activity, other specified: Secondary | ICD-10-CM | POA: Insufficient documentation

## 2016-03-15 DIAGNOSIS — S0101XA Laceration without foreign body of scalp, initial encounter: Secondary | ICD-10-CM | POA: Diagnosis not present

## 2016-03-15 DIAGNOSIS — Y929 Unspecified place or not applicable: Secondary | ICD-10-CM | POA: Diagnosis not present

## 2016-03-15 DIAGNOSIS — W1839XA Other fall on same level, initial encounter: Secondary | ICD-10-CM | POA: Insufficient documentation

## 2016-03-15 DIAGNOSIS — Y999 Unspecified external cause status: Secondary | ICD-10-CM | POA: Insufficient documentation

## 2016-03-15 DIAGNOSIS — F17200 Nicotine dependence, unspecified, uncomplicated: Secondary | ICD-10-CM | POA: Insufficient documentation

## 2016-03-15 DIAGNOSIS — F99 Mental disorder, not otherwise specified: Secondary | ICD-10-CM | POA: Insufficient documentation

## 2016-03-15 DIAGNOSIS — R55 Syncope and collapse: Secondary | ICD-10-CM | POA: Diagnosis not present

## 2016-03-15 DIAGNOSIS — S0990XA Unspecified injury of head, initial encounter: Secondary | ICD-10-CM | POA: Diagnosis present

## 2016-03-15 DIAGNOSIS — Z79899 Other long term (current) drug therapy: Secondary | ICD-10-CM | POA: Diagnosis not present

## 2016-03-15 DIAGNOSIS — F259 Schizoaffective disorder, unspecified: Secondary | ICD-10-CM | POA: Diagnosis not present

## 2016-03-15 LAB — CBC
HEMATOCRIT: 38.4 % — AB (ref 39.0–52.0)
HEMOGLOBIN: 13.2 g/dL (ref 13.0–17.0)
MCH: 30.6 pg (ref 26.0–34.0)
MCHC: 34.4 g/dL (ref 30.0–36.0)
MCV: 89.1 fL (ref 78.0–100.0)
Platelets: 251 10*3/uL (ref 150–400)
RBC: 4.31 MIL/uL (ref 4.22–5.81)
RDW: 13.1 % (ref 11.5–15.5)
WBC: 8.3 10*3/uL (ref 4.0–10.5)

## 2016-03-15 LAB — CBG MONITORING, ED: Glucose-Capillary: 145 mg/dL — ABNORMAL HIGH (ref 65–99)

## 2016-03-15 MED ORDER — LIDOCAINE-EPINEPHRINE 2 %-1:100000 IJ SOLN
20.0000 mL | Freq: Once | INTRAMUSCULAR | Status: DC
  Filled 2016-03-15: qty 1

## 2016-03-15 NOTE — ED Notes (Signed)
PA at bedside.

## 2016-03-15 NOTE — ED Provider Notes (Signed)
CSN: [id number]     Arrival date & time 03/15/16  2244 History  By signing my name below, I, Franklin Ayers, attest that this documentation has been prepared under the direction and in the presence of Franklin Moras, PA-C. Electronically Signed: Georgette Ayers, ED Scribe. 03/15/2016. 11:58 PM.   Chief Complaint  Patient presents with  . Fall  . Loss of Consciousness  . Head Injury   The history is provided by the patient and a parent. No language interpreter was used.    HPI Comments: Franklin Ayers is a 55 y.o. male who presents to the Emergency Department for a head injury s/p LOC. Pt states he was in the dining room having a pastry and then it got stuck in his throat and he choked. Pt got up and tried to drink some water and said he "got most of it down". He reports he passed out because he had trouble breathing. Pt struck his head on the floor and has a wound present. Bleeding is being controlled with a towel but is still present at this time. No one was present at the time of his episode but his mother heard and found him on the floor. Pt states he had no symptoms prior to his episode. Per mother, he has had multiple episodes of syncope before due to trouble swallowing because of the pustules in his mouth. Pt has no h/o seizures. No recent change in medication. Pt states he normally drinks two beers regularly and he did not drink more than usual. Pt is not on any blood thinners. Pt denies any additional injuries. Patient is in no pain at this time. Pt Tdap is UTD.   Past Medical History  Diagnosis Date  . Mental disorder   . Schizoaffective disorder [facility])    Past Surgical History  Procedure Laterality Date  . Tonsillectomy     Family History  Problem Relation Age of Onset  . Cancer Other   . Stroke Other    Social History  Substance Use Topics  . Smoking status: Current Every Day Smoker  . Smokeless tobacco: None  . Alcohol Use: Yes     Comment: occasional    Review of Systems   Constitutional: Negative for fever.  Respiratory: Negative for shortness of breath.   Cardiovascular: Negative for chest pain and leg swelling.  Gastrointestinal: Negative for nausea.  Musculoskeletal: Negative for back pain.  Skin: Positive for wound.  Neurological: Positive for syncope.  All other systems reviewed and are negative.     Allergies  Review of patient's allergies indicates no known allergies.  Home Medications   Prior to Admission medications   Medication Sig Start Date End Date Taking? Authorizing Provider  divalproex (DEPAKOTE ER) 500 MG 24 hr tablet Take 1,000 mg by mouth at bedtime.   Yes Historical Provider, MD  naproxen sodium (ANAPROX) 220 MG tablet Take 220 mg by mouth 2 (two) times daily with a meal.   Yes Historical Provider, MD  OLANZapine (ZYPREXA) 10 MG tablet Take 10-20 mg by mouth 2 (two) times daily. Take 10mg  in the morning and 20mg  at bedtime   Yes Historical Provider, MD  QUEtiapine (SEROQUEL) 50 MG tablet Take 50 mg by mouth at bedtime.   Yes Historical Provider, MD  HYDROcodone-acetaminophen (NORCO/VICODIN) 5-325 MG per tablet Take 1 tablet by mouth every 4 (four) hours as needed for pain. Patient not taking: Reported on 03/15/2016 06/21/12   Hazel Sams, PA-C  ibuprofen (ADVIL,MOTRIN) 800 MG tablet Take 1  tablet (800 mg total) by mouth 3 (three) times daily. Patient not taking: Reported on 03/15/2016 12/17/15   Orpah Greek, MD  methocarbamol (ROBAXIN) 500 MG tablet Take 1 tablet (500 mg total) by mouth every 8 (eight) hours as needed for muscle spasms. Patient not taking: Reported on 03/15/2016 12/17/15   Orpah Greek, MD   BP 124/82 mmHg  Pulse 87  Temp(Src) 98.3 F (36.8 C) (Oral)  Resp 18  SpO2 94% Physical Exam  Constitutional: He is oriented to person, place, and time. He appears well-developed and well-nourished.  HENT:  Head: Normocephalic.  No mid face tenderness. No malocclusion.    Eyes: Conjunctivae are normal.   Cardiovascular: Normal rate, regular rhythm and normal heart sounds.   Pulmonary/Chest: Effort normal and breath sounds normal. No respiratory distress.  Abdominal: Soft. Bowel sounds are normal. He exhibits no distension. There is no tenderness. There is no rebound.  Musculoskeletal: Normal range of motion.  Neurological: He is alert and oriented to person, place, and time.  Skin: Skin is warm and dry.  Obvious scalp laceration (Y shaped, approximately 12cm in total length) noted to the right occipital region with moderate swelling noted and dry blood. Blood is obscuring the laceration site.   Psychiatric: He has a normal mood and affect. His behavior is normal.  Nursing note and vitals reviewed.   ED Course  Procedures  DIAGNOSTIC STUDIES: Oxygen Saturation is 94% on RA, poor by my interpretation.    COORDINATION OF CARE: 11:44 PM Discussed treatment plan with pt at bedside which includes head CT and laceration repair and pt agreed to plan.  Labs Review Labs Reviewed  BASIC METABOLIC PANEL - Abnormal; Notable for the following:    Sodium 133 (*)    Chloride 97 (*)    Glucose, Bld 144 (*)    Calcium 8.8 (*)    All other components within normal limits  CBC - Abnormal; Notable for the following:    HCT 38.4 (*)    All other components within normal limits  URINALYSIS, ROUTINE W REFLEX MICROSCOPIC (NOT AT [facility]) - Abnormal; Notable for the following:    Protein, ur 30 (*)    All other components within normal limits  URINE MICROSCOPIC-ADD ON - Abnormal; Notable for the following:    Squamous Epithelial / LPF 0-5 (*)    Bacteria, UA RARE (*)    All other components within normal limits  CBG MONITORING, ED - Abnormal; Notable for the following:    Glucose-Capillary 145 (*)    All other components within normal limits   Results for orders placed or performed during the hospital encounter of 0000000  Basic metabolic panel  Result Value Ref Range   Sodium 133 (L) 135 - 145  mmol/L   Potassium 3.6 3.5 - 5.1 mmol/L   Chloride 97 (L) 101 - 111 mmol/L   CO2 28 22 - 32 mmol/L   Glucose, Bld 144 (H) 65 - 99 mg/dL   BUN 12 6 - 20 mg/dL   Creatinine, Ser 0.76 0.61 - 1.24 mg/dL   Calcium 8.8 (L) 8.9 - 10.3 mg/dL   GFR calc non Af Amer >60 >60 mL/min   GFR calc Af Amer >60 >60 mL/min   Anion gap 8 5 - 15  CBC  Result Value Ref Range   WBC 8.3 4.0 - 10.5 K/uL   RBC 4.31 4.22 - 5.81 MIL/uL   Hemoglobin 13.2 13.0 - 17.0 g/dL   HCT 38.4 (L) 39.0 -  52.0 %   MCV 89.1 78.0 - 100.0 fL   MCH 30.6 26.0 - 34.0 pg   MCHC 34.4 30.0 - 36.0 g/dL   RDW 13.1 11.5 - 15.5 %   Platelets 251 150 - 400 K/uL  Urinalysis, Routine w reflex microscopic  Result Value Ref Range   Color, Urine YELLOW YELLOW   APPearance CLEAR CLEAR   Specific Gravity, Urine 1.009 1.005 - 1.030   pH 7.0 5.0 - 8.0   Glucose, UA NEGATIVE NEGATIVE mg/dL   Hgb urine dipstick NEGATIVE NEGATIVE   Bilirubin Urine NEGATIVE NEGATIVE   Ketones, ur NEGATIVE NEGATIVE mg/dL   Protein, ur 30 (A) NEGATIVE mg/dL   Nitrite NEGATIVE NEGATIVE   Leukocytes, UA NEGATIVE NEGATIVE  Urine microscopic-add on  Result Value Ref Range   Squamous Epithelial / LPF 0-5 (A) NONE SEEN   WBC, UA 0-5 0 - 5 WBC/hpf   RBC / HPF 0-5 0 - 5 RBC/hpf   Bacteria, UA RARE (A) NONE SEEN  CBG monitoring, ED  Result Value Ref Range   Glucose-Capillary 145 (H) 65 - 99 mg/dL   Ct Head Wo Contrast  03/16/2016  CLINICAL DATA:  Acute onset of choking episode and syncope. Hit head on floor. Laceration at the right upper posterior head. Concern for head or cervical spine injury. Initial encounter. EXAM: CT HEAD WITHOUT CONTRAST CT CERVICAL SPINE WITHOUT CONTRAST TECHNIQUE: Multidetector CT imaging of the head and cervical spine was performed following the standard protocol without intravenous contrast. Multiplanar CT image reconstructions of the cervical spine were also generated. COMPARISON:  CT of the head performed 06/21/2012 FINDINGS: CT HEAD  FINDINGS There is no evidence of acute infarction, mass lesion, or intra- or extra-axial hemorrhage on CT. Prominence of the sulci suggests mild cortical volume loss. Mild cerebellar atrophy is noted. The brainstem and fourth ventricle are within normal limits. The basal ganglia are unremarkable in appearance. The cerebral hemispheres demonstrate grossly normal gray-white differentiation. No mass effect or midline shift is seen. There is no evidence of fracture; visualized osseous structures are unremarkable in appearance. The orbits are within normal limits. The paranasal sinuses and mastoid air cells are well-aerated. A prominent soft tissue hematoma is noted near the vertex, with associated laceration. A 1.5 cm subcutaneous cyst is noted at the left posterior neck. CT CERVICAL SPINE FINDINGS There is no evidence of fracture or subluxation. Vertebral bodies demonstrate normal height and alignment. Anterior and posterior disc osteophyte complexes are seen at the mid cervical spine. Intervertebral disc spaces are preserved. Prevertebral soft tissues are within normal limits. The thyroid gland is unremarkable in appearance. The visualized lung apices are clear. No significant soft tissue abnormalities are seen. IMPRESSION: 1. No evidence of traumatic intracranial injury or fracture. 2. No evidence of fracture or subluxation along the cervical spine. 3. Prominent soft tissue hematoma near the vertex, with associated laceration. 4. Mild cortical volume loss. 5. Mild degenerative change at the mid cervical spine. 6. 1.5 cm benign-appearing subcutaneous cyst at the left posterior neck. Electronically Signed   By: Garald Balding M.D.   On: 03/16/2016 01:33   Ct Cervical Spine Wo Contrast  03/16/2016  CLINICAL DATA:  Acute onset of choking episode and syncope. Hit head on floor. Laceration at the right upper posterior head. Concern for head or cervical spine injury. Initial encounter. EXAM: CT HEAD WITHOUT CONTRAST CT  CERVICAL SPINE WITHOUT CONTRAST TECHNIQUE: Multidetector CT imaging of the head and cervical spine was performed following the standard protocol without  intravenous contrast. Multiplanar CT image reconstructions of the cervical spine were also generated. COMPARISON:  CT of the head performed 06/21/2012 FINDINGS: CT HEAD FINDINGS There is no evidence of acute infarction, mass lesion, or intra- or extra-axial hemorrhage on CT. Prominence of the sulci suggests mild cortical volume loss. Mild cerebellar atrophy is noted. The brainstem and fourth ventricle are within normal limits. The basal ganglia are unremarkable in appearance. The cerebral hemispheres demonstrate grossly normal gray-white differentiation. No mass effect or midline shift is seen. There is no evidence of fracture; visualized osseous structures are unremarkable in appearance. The orbits are within normal limits. The paranasal sinuses and mastoid air cells are well-aerated. A prominent soft tissue hematoma is noted near the vertex, with associated laceration. A 1.5 cm subcutaneous cyst is noted at the left posterior neck. CT CERVICAL SPINE FINDINGS There is no evidence of fracture or subluxation. Vertebral bodies demonstrate normal height and alignment. Anterior and posterior disc osteophyte complexes are seen at the mid cervical spine. Intervertebral disc spaces are preserved. Prevertebral soft tissues are within normal limits. The thyroid gland is unremarkable in appearance. The visualized lung apices are clear. No significant soft tissue abnormalities are seen. IMPRESSION: 1. No evidence of traumatic intracranial injury or fracture. 2. No evidence of fracture or subluxation along the cervical spine. 3. Prominent soft tissue hematoma near the vertex, with associated laceration. 4. Mild cortical volume loss. 5. Mild degenerative change at the mid cervical spine. 6. 1.5 cm benign-appearing subcutaneous cyst at the left posterior neck. Electronically  Signed   By: Garald Balding M.D.   On: 03/16/2016 01:33     I have personally reviewed and evaluated these lab results as part of my medical decision-making.   EKG Interpretation   Date/Time:  Tuesday March 15 2016 23:15:39 EDT Ventricular Rate:  84 PR Interval:    QRS Duration: 94 QT Interval:  389 QTC Calculation: 460 R Axis:   73 Text Interpretation:  Sinus rhythm No significant change since last  tracing Confirmed by Winfred Leeds  MD, SAM [phone]) on 03/15/2016 11:18:01 PM      MDM   Final diagnoses:  Syncope and collapse  Scalp laceration, initial encounter    BP 123/75 mmHg  Pulse 69  Temp(Src) 98.3 F (36.8 C) (Oral)  Resp 21  SpO2 93%   I personally performed the services described in this documentation, which was scribed in my presence. The recorded information has been reviewed and is accurate.     3:01 AM Patient with history of dysphagia, having trouble swallowing his pastry today, ending up choking on his food and subsequently had a syncopal episode, likely vasovagal.  He had a significant laceration to his posterior scalp with moderate amount of bleeding. CT scan of the head and neck shows no acute fractures or dislocation. Scalp laceration repaired by me. Patient currently in no acute distress. No airway compromise. No trouble with phonation or talking. His labs are reassuring. EKG without concerning arrhythmia. Patient will follow-up with primary care provider in 7 days for staple removal. Pain medication prescribed.  LACERATION REPAIR Performed by: Franklin Ayers Authorized byDomenic Ayers Consent: Verbal consent obtained. Risks and benefits: risks, benefits and alternatives were discussed Consent given by: patient Patient identity confirmed: provided demographic data Prepped and Draped in normal sterile fashion Wound explored  Laceration Location: R occipital region  Laceration Length: 12cm (Y shape)  No Foreign Bodies seen or palpated  Anesthesia:  local infiltration  Local anesthetic: lidocaine 2% w epinephrine  Anesthetic  total: 15 ml  Irrigation method: syringe Amount of cleaning: standard  Skin closure: vicryl 4.0, and surgical staples  Number of sutures: 5.  Number of staples: 12  Technique: sharp surgical debridement with sterile scissor.  Approximation of skin.  Vertical mattress sutures and surgical staples.  Evacuation of scalp hematoma.    Patient tolerance: Patient tolerated the procedure well with no immediate complications.    Franklin Moras, PA-C 03/16/16 [id number]  Everlene Balls, MD 03/16/16 [phone]

## 2016-03-15 NOTE — ED Notes (Signed)
Pt states he choked on a piece of food tonight and passed out, hitting the back of his head on the linoleum floor. Pt has wound to the back of his head. Pt denies pain at this time.

## 2016-03-16 ENCOUNTER — Emergency Department (HOSPITAL_COMMUNITY): Payer: Medicare Other

## 2016-03-16 DIAGNOSIS — S0101XA Laceration without foreign body of scalp, initial encounter: Secondary | ICD-10-CM | POA: Diagnosis not present

## 2016-03-16 LAB — URINALYSIS, ROUTINE W REFLEX MICROSCOPIC
Bilirubin Urine: NEGATIVE
Glucose, UA: NEGATIVE mg/dL
Hgb urine dipstick: NEGATIVE
KETONES UR: NEGATIVE mg/dL
LEUKOCYTES UA: NEGATIVE
NITRITE: NEGATIVE
PH: 7 (ref 5.0–8.0)
PROTEIN: 30 mg/dL — AB
Specific Gravity, Urine: 1.009 (ref 1.005–1.030)

## 2016-03-16 LAB — BASIC METABOLIC PANEL
ANION GAP: 8 (ref 5–15)
BUN: 12 mg/dL (ref 6–20)
CALCIUM: 8.8 mg/dL — AB (ref 8.9–10.3)
CHLORIDE: 97 mmol/L — AB (ref 101–111)
CO2: 28 mmol/L (ref 22–32)
Creatinine, Ser: 0.76 mg/dL (ref 0.61–1.24)
Glucose, Bld: 144 mg/dL — ABNORMAL HIGH (ref 65–99)
Potassium: 3.6 mmol/L (ref 3.5–5.1)
SODIUM: 133 mmol/L — AB (ref 135–145)

## 2016-03-16 LAB — URINE MICROSCOPIC-ADD ON

## 2016-03-16 MED ORDER — HYDROCODONE-ACETAMINOPHEN 5-325 MG PO TABS: 1.0000 | ORAL_TABLET | ORAL | Status: DC | PRN

## 2016-03-16 NOTE — ED Notes (Signed)
Pt saturated chuck with blood. Head redressed.

## 2016-03-16 NOTE — ED Notes (Signed)
Lidocaine and suture cart at bedside.  

## 2016-03-16 NOTE — ED Notes (Signed)
Pt able to ambulate with stand by assist with no difficulty .

## 2016-03-16 NOTE — Discharge Instructions (Signed)
You have a significant laceration to your scalp from your recent fall.  Please follow up with your doctor in 7 days for staples removal.  Take pain medication as needed.  Return to the ER if you have any concerns.   Head Injury, Adult You have a head injury. Headaches and throwing up (vomiting) are common after a head injury. It should be easy to wake up from sleeping. Sometimes you must stay in the hospital. Most problems happen within the first 24 hours. Side effects may occur up to 7-10 days after the injury.  WHAT ARE THE TYPES OF HEAD INJURIES? Head injuries can be as minor as a bump. Some head injuries can be more severe. More severe head injuries include:  A jarring injury to the brain (concussion).  A bruise of the brain (contusion). This mean there is bleeding in the brain that can cause swelling.  A cracked skull (skull fracture).  Bleeding in the brain that collects, clots, and forms a bump (hematoma). WHEN SHOULD I GET HELP RIGHT AWAY?   You are confused or sleepy.  You cannot be woken up.  You feel sick to your stomach (nauseous) or keep throwing up (vomiting).  Your dizziness or unsteadiness is getting worse.  You have very bad, lasting headaches that are not helped by medicine. Take medicines only as told by your doctor.  You cannot use your arms or legs like normal.  You cannot walk.  You notice changes in the black spots in the center of the colored part of your eye (pupil).  You have clear or bloody fluid coming from your nose or ears.  You have trouble seeing. During the next 24 hours after the injury, you must stay with someone who can watch you. This person should get help right away (call 911 in the [place] if you start to shake and are not able to control it (have seizures), you pass out, or you are unable to wake up. HOW CAN I PREVENT A HEAD INJURY IN THE FUTURE?  Wear seat belts.  Wear a helmet while bike riding and playing sports like football.  Stay  away from dangerous activities around the house. WHEN CAN I RETURN TO NORMAL ACTIVITIES AND ATHLETICS? See your doctor before doing these activities. You should not do normal activities or play contact sports until 1 week after the following symptoms have stopped:  Headache that does not go away.  Dizziness.  Poor attention.  Confusion.  Memory problems.  Sickness to your stomach or throwing up.  Tiredness.  Fussiness.  Bothered by bright lights or loud noises.  Anxiousness or depression.  Restless sleep. MAKE SURE YOU:   Understand these instructions.  Will watch your condition.  Will get help right away if you are not doing well or get worse.   This information is not intended to replace advice given to you by your health care provider. Make sure you discuss any questions you have with your health care provider.   Document Released: 08/11/2008 Document Revised: 09/19/2014 Document Reviewed: 05/06/2013 Elsevier Interactive Patient Education 2016 [place], Adult A laceration is a cut that goes through all layers of the skin. The cut also goes into the tissue that is right under the skin. Some cuts heal on their own. Others need to be closed with stitches (sutures), staples, skin adhesive strips, or wound glue. Taking care of your cut lowers your risk of infection and helps your cut to heal better. HOW TO  TAKE CARE OF YOUR CUT For stitches or staples:  Keep the wound clean and dry.  If you were given a bandage (dressing), you should change it at least one time per day or as told by your doctor. You should also change it if it gets wet or dirty.  Keep the wound completely dry for the first 24 hours or as told by your doctor. After that time, you may take a shower or a bath. However, make sure that the wound is not soaked in water until after the stitches or staples have been removed.  Clean the wound one time each day or as told by your  doctor:  Wash the wound with soap and water.  Rinse the wound with water until all of the soap comes off.  Pat the wound dry with a clean towel. Do not rub the wound.  After you clean the wound, put a thin layer of antibiotic ointment on it as told by your doctor. This ointment:  Helps to prevent infection.  Keeps the bandage from sticking to the wound.  Have your stitches or staples removed as told by your doctor. If your doctor used skin adhesive strips:   Keep the wound clean and dry.  If you were given a bandage, you should change it at least one time per day or as told by your doctor. You should also change it if it gets dirty or wet.  Do not get the skin adhesive strips wet. You can take a shower or a bath, but be careful to keep the wound dry.  If the wound gets wet, pat it dry with a clean towel. Do not rub the wound.  Skin adhesive strips fall off on their own. You can trim the strips as the wound heals. Do not remove any strips that are still stuck to the wound. They will fall off after a while. If your doctor used wound glue:  Try to keep your wound dry, but you may briefly wet it in the shower or bath. Do not soak the wound in water, such as by swimming.  After you take a shower or a bath, gently pat the wound dry with a clean towel. Do not rub the wound.  Do not do any activities that will make you really sweaty until the skin glue has fallen off on its own.  Do not apply liquid, cream, or ointment medicine to your wound while the skin glue is still on.  If you were given a bandage, you should change it at least one time per day or as told by your doctor. You should also change it if it gets dirty or wet.  If a bandage is placed over the wound, do not let the tape for the bandage touch the skin glue.  Do not pick at the glue. The skin glue usually stays on for 5-10 days. Then, it falls off of the skin. General Instructions  To help prevent scarring, make sure  to cover your wound with sunscreen whenever you are outside after stitches are removed, after adhesive strips are removed, or when wound glue stays in place and the wound is healed. Make sure to wear a sunscreen of at least 30 SPF.  Take over-the-counter and prescription medicines only as told by your doctor.  If you were given antibiotic medicine or ointment, take or apply it as told by your doctor. Do not stop using the antibiotic even if your wound is getting better.  Do  not scratch or pick at the wound.  Keep all follow-up visits as told by your doctor. This is important.  Check your wound every day for signs of infection. Watch for:  Redness, swelling, or pain.  Fluid, blood, or pus.  Raise (elevate) the injured area above the level of your heart while you are sitting or lying down, if possible. GET HELP IF:  You got a tetanus shot and you have any of these problems at the injection site:  Swelling.  Very bad pain.  Redness.  Bleeding.  You have a fever.  A wound that was closed breaks open.  You notice a bad smell coming from your wound or your bandage.  You notice something coming out of the wound, such as wood or glass.  Medicine does not help your pain.  You have more redness, swelling, or pain at the site of your wound.  You have fluid, blood, or pus coming from your wound.  You notice a change in the color of your skin near your wound.  You need to change the bandage often because fluid, blood, or pus is coming from the wound.  You start to have a new rash.  You start to have numbness around the wound. GET HELP RIGHT AWAY IF:  You have very bad swelling around the wound.  Your pain suddenly gets worse and is very bad.  You notice painful lumps near the wound or on skin that is anywhere on your body.  You have a red streak going away from your wound.  The wound is on your hand or foot and you cannot move a finger or toe like you usually  can.  The wound is on your hand or foot and you notice that your fingers or toes look pale or bluish.   This information is not intended to replace advice given to you by your health care provider. Make sure you discuss any questions you have with your health care provider.   Document Released: 02/15/2008 Document Revised: 01/13/2015 Document Reviewed: 08/25/2014 Elsevier Interactive Patient Education [employer].

## 2016-12-22 ENCOUNTER — Encounter (HOSPITAL_COMMUNITY): Payer: Self-pay | Admitting: Emergency Medicine

## 2016-12-22 ENCOUNTER — Emergency Department (HOSPITAL_COMMUNITY)
Admission: EM | Admit: 2016-12-22 | Discharge: 2016-12-24 | Disposition: A | Discharge status: Other Acute Inpatient Hospital | Payer: Medicare Other | Attending: Emergency Medicine | Admitting: Emergency Medicine

## 2016-12-22 DIAGNOSIS — F259 Schizoaffective disorder, unspecified: Secondary | ICD-10-CM

## 2016-12-22 DIAGNOSIS — Z9114 Patient's other noncompliance with medication regimen: Secondary | ICD-10-CM | POA: Insufficient documentation

## 2016-12-22 DIAGNOSIS — Z79899 Other long term (current) drug therapy: Secondary | ICD-10-CM | POA: Diagnosis not present

## 2016-12-22 DIAGNOSIS — F17210 Nicotine dependence, cigarettes, uncomplicated: Secondary | ICD-10-CM | POA: Diagnosis not present

## 2016-12-22 DIAGNOSIS — F1410 Cocaine abuse, uncomplicated: Secondary | ICD-10-CM | POA: Insufficient documentation

## 2016-12-22 DIAGNOSIS — F22 Delusional disorders: Secondary | ICD-10-CM | POA: Diagnosis present

## 2016-12-22 DIAGNOSIS — F99 Mental disorder, not otherwise specified: Secondary | ICD-10-CM | POA: Insufficient documentation

## 2016-12-22 DIAGNOSIS — F3113 Bipolar disorder, current episode manic without psychotic features, severe: Secondary | ICD-10-CM | POA: Diagnosis not present

## 2016-12-22 LAB — COMPREHENSIVE METABOLIC PANEL
ALT: 25 U/L (ref 17–63)
ANION GAP: 9 (ref 5–15)
AST: 28 U/L (ref 15–41)
Albumin: 3.8 g/dL (ref 3.5–5.0)
Alkaline Phosphatase: 92 U/L (ref 38–126)
BUN: <5 mg/dL — ABNORMAL LOW (ref 6–20)
CHLORIDE: 100 mmol/L — AB (ref 101–111)
CO2: 28 mmol/L (ref 22–32)
Calcium: 9.3 mg/dL (ref 8.9–10.3)
Creatinine, Ser: 0.62 mg/dL (ref 0.61–1.24)
GFR calc non Af Amer: >60 mL/min (ref >60)
Glucose, Bld: 94 mg/dL (ref 65–99)
POTASSIUM: 3.7 mmol/L (ref 3.5–5.1)
SODIUM: 137 mmol/L (ref 135–145)
Total Bilirubin: 0.6 mg/dL (ref 0.3–1.2)
Total Protein: 6.9 g/dL (ref 6.5–8.1)

## 2016-12-22 LAB — CBC WITH DIFFERENTIAL/PLATELET
Basophils Absolute: 0.1 10*3/uL (ref 0.0–0.1)
Basophils Relative: 1 %
EOS ABS: 0.3 10*3/uL (ref 0.0–0.7)
Eosinophils Relative: 4 %
HCT: 38.3 % — ABNORMAL LOW (ref 39.0–52.0)
HEMOGLOBIN: 12.8 g/dL — AB (ref 13.0–17.0)
LYMPHS ABS: 1.9 10*3/uL (ref 0.7–4.0)
LYMPHS PCT: 22 %
MCH: 30.1 pg (ref 26.0–34.0)
MCHC: 33.4 g/dL (ref 30.0–36.0)
MCV: 90.1 fL (ref 78.0–100.0)
Monocytes Absolute: 0.6 10*3/uL (ref 0.1–1.0)
Monocytes Relative: 7 %
NEUTROS ABS: 5.8 10*3/uL (ref 1.7–7.7)
NEUTROS PCT: 66 %
Platelets: 292 10*3/uL (ref 150–400)
RBC: 4.25 MIL/uL (ref 4.22–5.81)
RDW: 13.7 % (ref 11.5–15.5)
WBC: 8.6 10*3/uL (ref 4.0–10.5)

## 2016-12-22 LAB — RAPID URINE DRUG SCREEN, HOSP PERFORMED
AMPHETAMINES: NOT DETECTED
Barbiturates: NOT DETECTED
Benzodiazepines: NOT DETECTED
Cocaine: POSITIVE — AB
OPIATES: NOT DETECTED
TETRAHYDROCANNABINOL: NOT DETECTED

## 2016-12-22 LAB — VALPROIC ACID LEVEL: Valproic Acid Lvl: 39 ug/mL — ABNORMAL LOW (ref 50.0–100.0)

## 2016-12-22 LAB — ETHANOL: Alcohol, Ethyl (B): <5 mg/dL (ref <5)

## 2016-12-22 MED ORDER — DIVALPROEX SODIUM ER 500 MG PO TB24
1000.0000 mg | ORAL_TABLET | Freq: Every day | ORAL | Status: DC
  Administered 2016-12-22 – 2016-12-23 (×2): 1000 mg via ORAL
  Filled 2016-12-22 (×2): qty 2

## 2016-12-22 MED ORDER — OLANZAPINE 10 MG PO TABS
10.0000 mg | ORAL_TABLET | Freq: Two times a day (BID) | ORAL | Status: DC
  Administered 2016-12-22 – 2016-12-23 (×2): 20 mg via ORAL
  Filled 2016-12-22 (×2): qty 2

## 2016-12-22 MED ORDER — QUETIAPINE FUMARATE 50 MG PO TABS
50.0000 mg | ORAL_TABLET | Freq: Every day | ORAL | Status: DC
Start: 2016-12-22 — End: 2016-12-24
  Administered 2016-12-22 – 2016-12-23 (×2): 50 mg via ORAL
  Filled 2016-12-22 (×2): qty 1

## 2016-12-22 NOTE — ED Notes (Signed)
Glasses at bedside.

## 2016-12-22 NOTE — ED Provider Notes (Signed)
[facility] DEPT Provider Note   CSN: [id number] Arrival date & time: 12/22/16  2017     History   Chief Complaint Chief Complaint  Patient presents with  . Medical Clearance    HPI Franklin Ayers is a 56 y.o. male.  The history is provided by a relative and the patient.  Mental Health Problem  Presenting symptoms: agitation, bizarre behavior, delusional, disorganized speech, disorganized thought process and hallucinations   Presenting symptoms: no suicidal thoughts   Degree of incapacity (severity):  Moderate Onset quality:  Gradual Timing:  Constant Progression:  Unchanged Chronicity:  Recurrent Context: noncompliance   Treatment compliance:  Unable to specify Relieved by:  Nothing Worsened by:  Nothing Risk factors: hx of mental illness (schizoaffective)     Past Medical History:  Diagnosis Date  . Mental disorder   . Schizoaffective disorder ([place])     There are no active problems to display for this patient.   Past Surgical History:  Procedure Laterality Date  . TONSILLECTOMY         Home Medications    Prior to Admission medications   Medication Sig Start Date End Date Taking? Authorizing Provider  divalproex (DEPAKOTE ER) 500 MG 24 hr tablet Take 1,000 mg by mouth at bedtime.    Historical Provider, MD  HYDROcodone-acetaminophen (NORCO/VICODIN) 5-325 MG tablet Take 1 tablet by mouth every 4 (four) hours as needed. 03/16/16   Domenic Moras, PA-C  ibuprofen (ADVIL,MOTRIN) 800 MG tablet Take 1 tablet (800 mg total) by mouth 3 (three) times daily. Patient not taking: Reported on 03/15/2016 12/17/15   Orpah Greek, MD  methocarbamol (ROBAXIN) 500 MG tablet Take 1 tablet (500 mg total) by mouth every 8 (eight) hours as needed for muscle spasms. Patient not taking: Reported on 03/15/2016 12/17/15   Orpah Greek, MD  naproxen sodium (ANAPROX) 220 MG tablet Take 220 mg by mouth 2 (two) times daily with a meal.    Historical Provider, MD  OLANZapine  (ZYPREXA) 10 MG tablet Take 10-20 mg by mouth 2 (two) times daily. Take 10mg  in the morning and 20mg  at bedtime    Historical Provider, MD  QUEtiapine (SEROQUEL) 50 MG tablet Take 50 mg by mouth at bedtime.    Historical Provider, MD    Family History Family History  Problem Relation Age of Onset  . Cancer Other   . Stroke Other     Social History Social History  Substance Use Topics  . Smoking status: Current Every Day Smoker    Packs/day: 2.00    Types: Cigarettes  . Smokeless tobacco: Never Used     Comment: refused  . Alcohol use Yes     Comment: occasional     Allergies   Patient has no known allergies.   Review of Systems Review of Systems  Unable to perform ROS: Mental status change  Psychiatric/Behavioral: Positive for agitation and hallucinations. Negative for suicidal ideas.     Physical Exam Updated Vital Signs BP 124/66 (BP Location: Left Arm)   Pulse 98   Temp 98.3 F (36.8 C) (Oral)   Resp 20   Ht 5\' 9"  (1.753 m)   Wt 186 lb (84.4 kg)   SpO2 97%   BMI 27.47 kg/m   Physical Exam  Constitutional: He is oriented to person, place, and time. He appears well-developed and well-nourished. No distress.  HENT:  Head: Normocephalic and atraumatic.  Nose: Nose normal.  Eyes: Conjunctivae are normal.  Neck: Neck supple. No tracheal deviation  present.  Cardiovascular: Normal rate and regular rhythm.   Pulmonary/Chest: Effort normal. No respiratory distress.  Abdominal: Soft. He exhibits no distension.  Neurological: He is alert and oriented to person, place, and time.  Skin: Skin is warm and dry.  Psychiatric: His mood appears anxious. His affect is labile and inappropriate. His speech is rapid and/or pressured and tangential. He is hyperactive. Thought content is delusional. Cognition and memory are impaired. He is inattentive (internal stimulation signs).     ED Treatments / Results  Labs (all labs ordered are listed, but only abnormal results are  displayed) Labs Reviewed  COMPREHENSIVE METABOLIC PANEL - Abnormal; Notable for the following:       Result Value   Chloride 100 (*)    BUN <5 (*)    All other components within normal limits  CBC WITH DIFFERENTIAL/PLATELET - Abnormal; Notable for the following:    Hemoglobin 12.8 (*)    HCT 38.3 (*)    All other components within normal limits  RAPID URINE DRUG SCREEN, HOSP PERFORMED - Abnormal; Notable for the following:    Cocaine POSITIVE (*)    All other components within normal limits  VALPROIC ACID LEVEL - Abnormal; Notable for the following:    Valproic Acid Lvl 39 (*)    All other components within normal limits  ETHANOL    EKG  EKG Interpretation None       Radiology No results found.  Procedures Procedures (including critical care time)  Medications Ordered in ED Medications - No data to display   Initial Impression / Assessment and Plan / ED Course  I have reviewed the triage vital signs and the nursing notes.  Pertinent labs & imaging results that were available during my care of the patient were reviewed by me and considered in my medical decision making (see chart for details).     56 year old male presents with acute psychosis. He appears internally stimulated, has diffuse echolalia and is unable to participate in exam. Depakote level is low suggesting noncompliance of medications and he has been doing cocaine. Home medications were ordered. TTS consulted to evaluate for psychiatric disposition. Patient is here voluntarily. MEDICALLY CLEAR FOR TRANSFER OR PSYCHIATRIC ADMISSION.   Final Clinical Impressions(s) / ED Diagnoses   Final diagnoses:  Cocaine abuse  Noncompliance with medication regimen  Psychiatric disturbance    New Prescriptions New Prescriptions   No medications on file     Franklin Grosser, MD 12/23/16 [phone]

## 2016-12-22 NOTE — ED Triage Notes (Signed)
Patient states his reason for coming to the ER is "My mother". Patient refusing to answer questions and began to yell at this writer "I don't know why!". Patient then began to Midwife and repeating word for word what was said. Pt irritable and uncooperative on arrival.  Per EMS, patient's mother stated he was acting agitated and she believes he has not been taking his medications as ordered. Pt denies suicidal or homicidal ideations by yelling "No!".

## 2016-12-22 NOTE — ED Notes (Signed)
Patient laughing out loud to himself and is noted to talk to himself. Pt responding to internal stimuli.

## 2016-12-22 NOTE — ED Notes (Signed)
Patient with 2 black banded watches, 1 pair jeans, 1 blue t-shirt, 1 belt, 1 wallet - no money, 1 pack cigarettes, 2 lighters. Items placed in locker # 31

## 2016-12-22 NOTE — ED Notes (Signed)
Patient placed in paper scrubs and was wanded by security. Patient remains irritable with staff members and is sarcastic in his responses.

## 2016-12-22 NOTE — BH Assessment (Signed)
Spoke to Rosezella Rumpf, RN who said Pt has received medication and is unable to participate in assessment at this time. She will contact TTS when Pt is awake and able to participate.   Orpah Greek Anson Fret, LPC, [facility], [facility] Triage Specialist [phone]

## 2016-12-23 DIAGNOSIS — F1410 Cocaine abuse, uncomplicated: Secondary | ICD-10-CM | POA: Diagnosis not present

## 2016-12-23 DIAGNOSIS — F259 Schizoaffective disorder, unspecified: Secondary | ICD-10-CM

## 2016-12-23 DIAGNOSIS — F3113 Bipolar disorder, current episode manic without psychotic features, severe: Secondary | ICD-10-CM | POA: Diagnosis not present

## 2016-12-23 DIAGNOSIS — F17210 Nicotine dependence, cigarettes, uncomplicated: Secondary | ICD-10-CM

## 2016-12-23 DIAGNOSIS — Z79899 Other long term (current) drug therapy: Secondary | ICD-10-CM | POA: Diagnosis not present

## 2016-12-23 MED ORDER — NICOTINE 21 MG/24HR TD PT24
21.0000 mg | MEDICATED_PATCH | Freq: Every day | TRANSDERMAL | Status: DC
  Administered 2016-12-23 – 2016-12-24 (×2): 21 mg via TRANSDERMAL
  Filled 2016-12-23 (×2): qty 1

## 2016-12-23 MED ORDER — HYDROXYZINE HCL 25 MG PO TABS: 50.0000 mg | ORAL_TABLET | Freq: Four times a day (QID) | ORAL | Status: DC | PRN

## 2016-12-23 MED ORDER — OLANZAPINE 10 MG PO TABS
20.0000 mg | ORAL_TABLET | Freq: Two times a day (BID) | ORAL | Status: DC
  Administered 2016-12-23 – 2016-12-24 (×2): 20 mg via ORAL
  Filled 2016-12-23 (×2): qty 2

## 2016-12-23 NOTE — ED Notes (Signed)
Report to include Situation, Background, Assessment, and Recommendations received from [facility]. Patient alert, warm and dry, in no acute distress. Patient refuses to answer questions related to SI, HI, AVH and pain manic walking in hall. Patient made aware of Q15 minute rounds and security cameras for their safety. Patient instructed to come to me with needs or concerns.

## 2016-12-23 NOTE — BH Assessment (Signed)
[place] Assessment Progress Note Case was staffed with Markus Jarvis NP who recommended a inpatient admission as appropriate bed placement is investigated.

## 2016-12-23 NOTE — BH Assessment (Addendum)
Assessment Note  Franklin Ayers is an 56 y.o. male that presents this date actively psychotic. Patient is not oriented to time/place and speaks incoherently. Patient tests positive for cocaine. Patient does not seem to process the content of this writer's questions. Patient seems to be responding to internal stimuli and is talking to the television in his room. Patient also is pointing to the corner of his room  this date and laughing. Patient cannot be assessed due to current mental state. Patient has limited history per note review. Patient tested positive for cocaine this date. Information for purposes of assessment was obtained from admission notes. Per notes, "Patient states his reason for coming to the ER is "My mother". Patient refusing to answer questions and began to yell at this writer "I don't know why!". Patient then began to Midwife and repeating word for word what was said. Pt irritable and uncooperative on arrival. Per EMS, patient's mother stated he was acting agitated and she believes he has not been taking his medications as ordered. Pt denies suicidal or homicidal ideations by yelling "No!". This writer attempted to contact mother unsuccessfully". Case was staffed with Franklin Jarvis NP who recommended a inpatient admission as appropriate bed placement is investigated.  Diagnosis: Schizoaffective (per notes)   Past Medical History:  Past Medical History:  Diagnosis Date  . Mental disorder   . Schizoaffective disorder [facility])     Past Surgical History:  Procedure Laterality Date  . TONSILLECTOMY      Family History:  Family History  Problem Relation Age of Onset  . Cancer Other   . Stroke Other     Social History:  reports that he has been smoking Cigarettes.  He has been smoking about 2.00 packs per day. He has never used smokeless tobacco. He reports that he drinks alcohol. He reports that he does not use drugs.  Additional Social History:  Alcohol / Drug Use Pain  Medications: See MAR Prescriptions: See MAR Over the Counter: See MAR History of alcohol / drug use?:  (UTA pt denies but is positive for cocaine) Longest period of sobriety (when/how long): Unknown Negative Consequences of Use:  (UTA) Withdrawal Symptoms:  (UTA)  CIWA: CIWA-Ar BP: 115/67 Pulse Rate: 76 COWS:    Allergies:  Allergies  Allergen Reactions  . Penicillins Shortness Of Breath    .Has patient had a PCN reaction causing immediate rash, facial/tongue/throat swelling, SOB or lightheadedness with hypotension: yes Has patient had a PCN reaction causing severe rash involving mucus membranes or skin necrosis: no Has patient had a PCN reaction that required hospitalization: no Has patient had a PCN reaction occurring within the last 10 years: no If all of the above answers are "NO", then may proceed with Cephalosporin use.     Home Medications:  (Not in a hospital admission)  OB/GYN Status:  No LMP for male patient.  General Assessment Data Location of Assessment: [facility] ED TTS Assessment: In system Is this a Tele or Face-to-Face Assessment?: Face-to-Face Is this an Initial Assessment or a Re-assessment for this encounter?: Initial Assessment Marital status:  [name]) Maiden name: na Is patient pregnant?: No Pregnancy Status: No Living Arrangements: Parent (per notes) Can pt return to current living arrangement?: Yes Admission Status: Voluntary Is patient capable of signing voluntary admission?: Yes Referral Source: Self/Family/Friend Insurance type: Medicare  Medical Screening Exam ([place]) Medical Exam completed: Yes  Crisis Care Plan Living Arrangements: Parent (per notes) Legal Guardian:  (UTA) Name of Psychiatrist: [place] Name  of Therapist: UTA  Education Status Is patient currently in school?:  (UTA) Current Grade:  (UTA) Highest grade of school patient has completed:  (UTA) Name of school:  (UTA) Contact person:  (UTA)  Risk to self with the  past 6 months Suicidal Ideation:  (UTA) Has patient been a risk to self within the past 6 months prior to admission? :  (UTA) Suicidal Intent:  (UTA) Has patient had any suicidal intent within the past 6 months prior to admission? :  (UTA) Is patient at risk for suicide?:  (UTA) Suicidal Plan?:  (UTA) Has patient had any suicidal plan within the past 6 months prior to admission? :  (UTA) Access to Means:  (UTA) What has been your use of drugs/alcohol within the last 12 months?: Current use per UDS Previous Attempts/Gestures:  (UTA) How many times?:  (UTA) Other Self Harm Risks:  (UTA) Triggers for Past Attempts:  (UTA) Intentional Self Injurious Behavior:  (UTA) Family Suicide History:  (UTA) Recent stressful life event(s):  (UTA) Persecutory voices/beliefs?:  [name]) Depression:  (UTA) Depression Symptoms:  (UTA) Substance abuse history and/or treatment for substance abuse?:  (UTA) Suicide prevention information given to non-admitted patients:  (UTA)  Risk to Others within the past 6 months Homicidal Ideation:  (UTA) Does patient have any lifetime risk of violence toward others beyond the six months prior to admission? :  (UTA) Thoughts of Harm to Others:  (UTA) Current Homicidal Intent:  (UTA) Current Homicidal Plan:  (UTA) Access to Homicidal Means:  (UTA) Identified Victim:  (UTA) History of harm to others?:  (UTA) Assessment of Violence:  (UTA) Violent Behavior Description:  (UTA) Does patient have access to weapons?:  ([place]) Criminal Charges Pending?:  (UTA) Does patient have a court date:  (UTA) Is patient on probation?:  (UTA)  Psychosis Hallucinations: Auditory, Visual (noted on admission) Delusions:  (UTA)  Mental Status Report Appearance/Hygiene: In scrubs Eye Contact: Poor Motor Activity: Freedom of movement Speech: Pressured, Loud Level of Consciousness: Irritable Mood: Preoccupied Affect: Blunted Anxiety Level: Moderate Thought Processes: Thought  Blocking Judgement: Impaired Orientation: Unable to assess Obsessive Compulsive Thoughts/Behaviors: Unable to Assess  Cognitive Functioning Concentration: Unable to Assess Memory: Unable to Assess IQ:  (UTA) Insight: Poor Impulse Control: Poor Appetite:  (UTA) Weight Loss:  (UTA) Weight Gain:  (UTA) Sleep:  (UTA) Total Hours of Sleep:  (UTA) Vegetative Symptoms: None  ADLScreening [facility] Assessment Services) Patient's cognitive ability adequate to safely complete daily activities?: Yes Patient able to express need for assistance with ADLs?: Yes Independently performs ADLs?: Yes (appropriate for developmental age)  Prior Inpatient Therapy Prior Inpatient Therapy:  (UTA) Prior Therapy Dates:  (UTA) Prior Therapy Facilty/Provider(s):  (UTA) Reason for Treatment:  (UTA)  Prior Outpatient Therapy Prior Outpatient Therapy:  (UTA) Prior Therapy Dates:  (UTA) Prior Therapy Facilty/Provider(s):  (UTA) Reason for Treatment:  (UTA) Does patient have an ACCT team?:  (UTA) Does patient have Intensive In-House Services?  :  (UTA) Does patient have [facility] services? :  (UTA) Does patient have P4CC services?:  (UTA)  ADL Screening (condition at time of admission) Patient's cognitive ability adequate to safely complete daily activities?: Yes Is the patient deaf or have difficulty hearing?: No Does the patient have difficulty seeing, even when wearing glasses/contacts?: No Does the patient have difficulty concentrating, remembering, or making decisions?: Yes Patient able to express need for assistance with ADLs?: Yes Does the patient have difficulty dressing or bathing?: No Independently performs ADLs?: Yes (appropriate for developmental age) Does  the patient have difficulty walking or climbing stairs?: No Weakness of Legs: None Weakness of Arms/Hands: None  Home Assistive Devices/Equipment Home Assistive Devices/Equipment: None  Therapy Consults (therapy consults require a  physician order) PT Evaluation Needed: No OT Evalulation Needed: No SLP Evaluation Needed: No Abuse/Neglect Assessment (Assessment to be complete while patient is alone) Physical Abuse: Denies Verbal Abuse: Denies Sexual Abuse: Denies Exploitation of patient/patient's resources: Denies Self-Neglect: Denies Values / Beliefs Cultural Requests During Hospitalization: None Spiritual Requests During Hospitalization: None Consults Spiritual Care Consult Needed: No Social Work Consult Needed: No Regulatory affairs officer (For Healthcare) Does Patient Have a Medical Advance Directive?: No Would patient like information on creating a medical advance directive?: No - Patient declined    Additional Information 1:1 In Past 12 Months?:  (UTA) CIRT Risk:  (UTA) Elopement Risk:  (UTA) Does patient have medical clearance?: Yes     Disposition: Case was staffed with Franklin Jarvis NP who recommended a inpatient admission as appropriate bed placement is investigated.  Disposition Initial Assessment Completed for this Encounter: Yes Disposition of Patient: Other dispositions Other disposition(s): Other (Comment) (re-eval in the a.m.)  On Site Evaluation by:   Reviewed with Physician:    Mamie Nick 12/23/2016 1:17 PM

## 2016-12-23 NOTE — ED Notes (Signed)
Hourly rounding reveals patient sleeping in room. No complaints, stable, in no acute distress. Q15 minute rounds and monitoring via Security Cameras to continue. 

## 2016-12-23 NOTE — Progress Notes (Addendum)
CSW received a call from [place] at [facility] who wanted to know if pt still needs a bed.  CSW confirmed he does, Ronalee Belts asked to speak with pt's RN for collateral information.  RN speaking to [place] at [facility] now.  Ronalee Belts will call back with decision. Alphonse Guild. Terena Bohan, Latanya Presser, LCAS Clinical Social Worker Ph: [phone]

## 2016-12-23 NOTE — Progress Notes (Signed)
CSW received a call from [place] at [facility] who asked if pt stilll needed placement.  CSW stated pt needs placement.  Lattie Haw will confer with medical team and will call back.  Please reconsult if future social work needs arise.     Alphonse Guild. Joud Pettinato, Latanya Presser, LCAS Clinical Social Worker Ph: [phone]

## 2016-12-23 NOTE — ED Notes (Signed)
Bed: [facility] Expected date:  Expected time:  Means of arrival:  Comments: Room 31

## 2016-12-23 NOTE — BH Assessment (Signed)
[place] Assessment Progress Note  Per Corena Pilgrim, MD, this pt requires psychiatric hospitalization at this time.  The following facilities have been contacted to seek placement for this pt, with results as noted:  Beds available, information sent, decision pending:  [place], [place] Triage Specialist [phone]

## 2016-12-23 NOTE — ED Notes (Signed)
Patient awake and with bright affect. Pt laughing and joking with staff. No inappropriate behaviors noted.

## 2016-12-23 NOTE — ED Notes (Signed)
Pt standing in doorway at this time. Calm and cooperative. NAD noted.

## 2016-12-23 NOTE — Consult Note (Signed)
[facility] Face-to-Face Psychiatry Consult   Reason for Consult:  Mania Referring Physician:  EDP Patient Identification: Franklin Ayers MRN:  [id number] Principal Diagnosis: Bipolar affective disorder, current episode manic [facility]) Diagnosis:   Patient Active Problem List   Diagnosis Date Noted  . Bipolar affective disorder, current episode manic (HCC) [F31.9] 12/23/2016    Total Time spent with ptient: 30 minutes  Subjective:   Franklin Ayers is a 56 y.o. male patient admitted with mania.  HPI:  Franklin Ayers is an 56 y.o. male seen today in the ED.  He is actively psychotic. Patient is not oriented to time/place and speaks incoherently. Patient tests positive for cocaine. Patient does not seem to process the questions being asked by Dr Jannifer Franklin and this NP.  He did manage to say that he has seen Dr Norma Fredrickson at [facility] and that he has taken several psychiatric medications.  Patient has limited history per note review. Information for purposes of assessment was obtained from admission notes.   Per TTS counselor notes, "Patient states his reason for coming to the ER is "My mother". Patient refusing to answer questions and began to yell at this writer "I don't know why!". Patient then began to Psychologist, counselling and repeating word for word what was said. Pt irritable and uncooperative on arrival. Per EMS, patient's mother stated he was acting agitated and she believes he has not been taking his medications as ordered.  UDS positive cocaine.  Past Psychiatric History: see HPI  Risk to Self: Suicidal Ideation:  (UTA) Suicidal Intent:  (UTA) Is patient at risk for suicide?:  (UTA) Suicidal Plan?:  (UTA) Access to Means:  (UTA) What has been your use of drugs/alcohol within the last 12 months?: Current use per UDS How many times?:  (UTA) Other Self Harm Risks:  (UTA) Triggers for Past Attempts:  (UTA) Intentional Self Injurious Behavior:  (UTA) Risk to Others: Homicidal Ideation:   (UTA) Thoughts of Harm to Others:  (UTA) Current Homicidal Intent:  (UTA) Current Homicidal Plan:  (UTA) Access to Homicidal Means:  (UTA) Identified Victim:  (UTA) History of harm to others?:  (UTA) Assessment of Violence:  (UTA) Violent Behavior Description:  (UTA) Does patient have access to weapons?:  (UTA) Criminal Charges Pending?:  (UTA) Does patient have a court date:  (UTA) Prior Inpatient Therapy: Prior Inpatient Therapy:  (UTA) Prior Therapy Dates:  (UTA) Prior Therapy Facilty/Provider(s):  (UTA) Reason for Treatment:  (UTA) Prior Outpatient Therapy: Prior Outpatient Therapy:  (UTA) Prior Therapy Dates:  (UTA) Prior Therapy Facilty/Provider(s):  (UTA) Reason for Treatment:  (UTA) Does patient have an ACCT team?:  (UTA) Does patient have Intensive In-House Services?  :  (UTA) Does patient have [facility] services? :  (UTA) Does patient have P4CC services?:  (UTA)  Past Medical History:  Past Medical History:  Diagnosis Date  . Mental disorder   . Schizoaffective disorder [facility])     Past Surgical History:  Procedure Laterality Date  . TONSILLECTOMY     Family History:  Family History  Problem Relation Age of Onset  . Cancer Other   . Stroke Other    Family Psychiatric  History: see HPI Social History:  History  Alcohol Use  . Yes    Comment: occasional     History  Drug Use No    Social History   Social History  . Marital status: Single    Spouse name: N/A  . Number of children: N/A  . Years of education: N/A  Social History Main Topics  . Smoking status: Current Every Day Smoker    Packs/day: 2.00    Types: Cigarettes  . Smokeless tobacco: Never Used     Comment: refused  . Alcohol use Yes     Comment: occasional  . Drug use: No  . Sexual activity: No   Other Topics Concern  . None   Social History Narrative  . None   Additional Social History:    Allergies:   Allergies  Allergen Reactions  . Penicillins Shortness Of Breath     .Has patient had a PCN reaction causing immediate rash, facial/tongue/throat swelling, SOB or lightheadedness with hypotension: yes Has patient had a PCN reaction causing severe rash involving mucus membranes or skin necrosis: no Has patient had a PCN reaction that required hospitalization: no Has patient had a PCN reaction occurring within the last 10 years: no If all of the above answers are "NO", then may proceed with Cephalosporin use.     Labs:  Results for orders placed or performed during the hospital encounter of 12/22/16 (from the past 48 hour(s))  Comprehensive metabolic panel     Status: Abnormal   Collection Time: 12/22/16  9:02 PM  Result Value Ref Range   Sodium 137 135 - 145 mmol/L   Potassium 3.7 3.5 - 5.1 mmol/L   Chloride 100 (L) 101 - 111 mmol/L   CO2 28 22 - 32 mmol/L   Glucose, Bld 94 65 - 99 mg/dL   BUN <5 (L) 6 - 20 mg/dL   Creatinine, Ser 0.62 0.61 - 1.24 mg/dL   Calcium 9.3 8.9 - 10.3 mg/dL   Total Protein 6.9 6.5 - 8.1 g/dL   Albumin 3.8 3.5 - 5.0 g/dL   AST 28 15 - 41 U/L   ALT 25 17 - 63 U/L   Alkaline Phosphatase 92 38 - 126 U/L   Total Bilirubin 0.6 0.3 - 1.2 mg/dL   GFR calc non Af Amer >60 >60 mL/min   GFR calc Af Amer >60 >60 mL/min    Comment: (NOTE) The eGFR has been calculated using the CKD EPI equation. This calculation has not been validated in all clinical situations. eGFR's persistently <60 mL/min signify possible Chronic Kidney Disease.    Anion gap 9 5 - 15  Ethanol     Status: None   Collection Time: 12/22/16  9:02 PM  Result Value Ref Range   Alcohol, Ethyl (B) <5 <5 mg/dL    Comment:        LOWEST DETECTABLE LIMIT FOR SERUM ALCOHOL IS 5 mg/dL FOR MEDICAL PURPOSES ONLY   CBC with Diff     Status: Abnormal   Collection Time: 12/22/16  9:02 PM  Result Value Ref Range   WBC 8.6 4.0 - 10.5 K/uL   RBC 4.25 4.22 - 5.81 MIL/uL   Hemoglobin 12.8 (L) 13.0 - 17.0 g/dL   HCT 38.3 (L) 39.0 - 52.0 %   MCV 90.1 78.0 - 100.0 fL   MCH  30.1 26.0 - 34.0 pg   MCHC 33.4 30.0 - 36.0 g/dL   RDW 13.7 11.5 - 15.5 %   Platelets 292 150 - 400 K/uL   Neutrophils Relative % 66 %   Neutro Abs 5.8 1.7 - 7.7 K/uL   Lymphocytes Relative 22 %   Lymphs Abs 1.9 0.7 - 4.0 K/uL   Monocytes Relative 7 %   Monocytes Absolute 0.6 0.1 - 1.0 K/uL   Eosinophils Relative 4 %   Eosinophils  Absolute 0.3 0.0 - 0.7 K/uL   Basophils Relative 1 %   Basophils Absolute 0.1 0.0 - 0.1 K/uL  Valproic acid level     Status: Abnormal   Collection Time: 12/22/16  9:02 PM  Result Value Ref Range   Valproic Acid Lvl 39 (L) 50.0 - 100.0 ug/mL  Urine rapid drug screen (hosp performed)not at [facility]     Status: Abnormal   Collection Time: 12/22/16  9:31 PM  Result Value Ref Range   Opiates NONE DETECTED NONE DETECTED   Cocaine POSITIVE (A) NONE DETECTED   Benzodiazepines NONE DETECTED NONE DETECTED   Amphetamines NONE DETECTED NONE DETECTED   Tetrahydrocannabinol NONE DETECTED NONE DETECTED   Barbiturates NONE DETECTED NONE DETECTED    Comment:        DRUG SCREEN FOR MEDICAL PURPOSES ONLY.  IF CONFIRMATION IS NEEDED FOR ANY PURPOSE, NOTIFY LAB WITHIN 5 DAYS.        LOWEST DETECTABLE LIMITS FOR URINE DRUG SCREEN Drug Class       Cutoff (ng/mL) Amphetamine      1000 Barbiturate      200 Benzodiazepine   683 Tricyclics       419 Opiates          300 Cocaine          300 THC              50     Current Facility-Administered Medications  Medication Dose Route Frequency Provider Last Rate Last Dose  . divalproex (DEPAKOTE ER) 24 hr tablet 1,000 mg  1,000 mg Oral QHS Leo Grosser, MD   1,000 mg at 12/22/16 2236  . OLANZapine (ZYPREXA) tablet 10-20 mg  10-20 mg Oral BID Leo Grosser, MD   20 mg at 12/23/16 6222  . QUEtiapine (SEROQUEL) tablet 50 mg  50 mg Oral QHS Leo Grosser, MD   50 mg at 12/22/16 2236   Current Outpatient Prescriptions  Medication Sig Dispense Refill  . divalproex (DEPAKOTE ER) 500 MG 24 hr tablet Take 1,000 mg by mouth at  bedtime.    . naproxen sodium (ANAPROX) 220 MG tablet Take 220 mg by mouth 2 (two) times daily as needed (pain).     Marland Kitchen OLANZapine (ZYPREXA) 20 MG tablet Take 20 mg by mouth 2 (two) times daily.    . QUEtiapine (SEROQUEL) 100 MG tablet Take 100 mg by mouth 3 (three) times daily.      Musculoskeletal: Strength & Muscle Tone: within normal limits Gait & Station: normal Patient leans: N/A  Psychiatric Specialty Exam: Physical Exam  Nursing note and vitals reviewed.   ROS  Blood pressure 115/67, pulse 76, temperature 97.9 F (36.6 C), temperature source Oral, resp. rate 18, height _0  (1.753 m), weight 84.4 kg (186 lb), SpO2 98 %.Body mass index is 27.47 kg/m.  General Appearance: Fairly Groomed  Eye Contact:  Good  Speech:  Normal Rate  Volume:  Normal  Mood:  manic  Affect:  Labile and Full Range  Thought Process:  Disorganized and Irrelevant  Orientation:  Other:  to self  Thought Content:  Illogical, Rumination and Tangential  Suicidal Thoughts:  No  Homicidal Thoughts:  No  Memory:  Immediate;   Poor Recent;   Poor Remote;   Poor  Judgement:  Poor  Insight:  Fair and Lacking  Psychomotor Activity:  Normal  Concentration:  Concentration: Poor and Attention Span: Poor  Recall:  Poor  Fund of Knowledge:  Poor  Language:  Poor  Akathisia:  No  Handed:  Right  AIMS (if indicated):     Assets:  Physical Health  ADL's:  Intact  Cognition:  Impaired,  Moderate  Sleep:      Treatment Plan Summary: Daily contact with patient to assess and evaluate symptoms and progress in treatment, Medication management and Plan inpatient treatment  Disposition: Recommend psychiatric Inpatient admission when medically cleared.  Janett Labella, NP [facility] 12/23/2016 1:37 PM  Patient seen face-to-face for psychiatric evaluation, chart reviewed and case discussed with the physician extender and developed treatment plan. Reviewed the information documented and agree with the treatment  plan. Corena Pilgrim, MD

## 2016-12-23 NOTE — ED Notes (Signed)
Hourly rounding reveals patient in room. No complaints, stable, in no acute distress. Q15 minute rounds and monitoring via Security Cameras to continue. 

## 2016-12-23 NOTE — ED Notes (Signed)
Hourly rounding reveals patient in room eating snack after crushing crackers on over bed table. No complaints, stable, in no acute distress. Q15 minute rounds and monitoring via [employer] to continue.

## 2016-12-23 NOTE — ED Notes (Signed)
Pt standing at the door and having conversations with staff.  Pt is calm and cooperative at this time.

## 2016-12-24 DIAGNOSIS — F1410 Cocaine abuse, uncomplicated: Secondary | ICD-10-CM | POA: Diagnosis not present

## 2016-12-24 NOTE — ED Notes (Signed)
Hourly rounding reveals patient sleeping in room. No complaints, stable, in no acute distress. Q15 minute rounds and monitoring via Security Cameras to continue. 

## 2016-12-24 NOTE — ED Notes (Signed)
Pt was accepted to [facility] in [place], report was called to Nurse Bunnie Philips. Sheriff was contacted at [phone], message was left on voicemail.

## 2016-12-24 NOTE — ED Notes (Signed)
Pt has been up and about on the unit today, he paces the halls and remains to himself. This writer attempted to talk to patient, pt reported that his teeth were fake and that he needed to clean them when he was released. This Probation officer offered toiletries to patient, pt stated "you can do that then why haven't you done it then. " Pt then started talking under his breathe, and closed the door on this Probation officer.

## 2016-12-24 NOTE — ED Notes (Signed)
Pt continues to walk around the unit. He is somewhat irritable and does not want to go to [facility]. He says, "unacceptable" repetitiously.

## 2016-12-24 NOTE — ED Notes (Signed)
Sheriff was contacted at [phone], message was left on voicemail.

## 2016-12-24 NOTE — Clinical Social Work Note (Signed)
LCSW facilitated IVC paperwork for patient who has an inpatient bed (900 hall). at [facility] in [place].  Accepting MD is Dr. Josefina Do Rasul [phone].  Paperwork will be faxed to the sheriff's office and once patient is served and sent to [employer] (fax [phone]) he will be sent to psychiatric facility.  RN notified.   Dede Query, [place] Worker - Weekend Coverage

## 2016-12-24 NOTE — ED Notes (Signed)
LCSW facilitated IVC paperwork for patient so that he could be admitted to at [facility] in [place].  Accepting MD was Dr. Josefina Do Rasul [phone].  Paperwork was faxed to the sheriff's office. IVC paperwork done due to patient refusing to sign paperwork.

## 2016-12-24 NOTE — Clinical Social Work Note (Signed)
LCSW confirmed with magistrates office that they had received patient's IVC paperwork. Per magistrate paperwork is in place and someone will be assigned to come out and serve patient.  Dede Query, [place] Worker

## 2017-06-16 ENCOUNTER — Encounter (HOSPITAL_COMMUNITY): Payer: Self-pay | Admitting: Emergency Medicine

## 2017-06-16 ENCOUNTER — Emergency Department (HOSPITAL_COMMUNITY)
Admission: EM | Admit: 2017-06-16 | Discharge: 2017-06-19 | Disposition: A | Discharge status: Home / Self Care | Payer: Medicare Other | Attending: Emergency Medicine | Admitting: Emergency Medicine

## 2017-06-16 DIAGNOSIS — F17210 Nicotine dependence, cigarettes, uncomplicated: Secondary | ICD-10-CM | POA: Insufficient documentation

## 2017-06-16 DIAGNOSIS — Z880 Allergy status to penicillin: Secondary | ICD-10-CM | POA: Insufficient documentation

## 2017-06-16 DIAGNOSIS — F312 Bipolar disorder, current episode manic severe with psychotic features: Secondary | ICD-10-CM | POA: Diagnosis not present

## 2017-06-16 DIAGNOSIS — F259 Schizoaffective disorder, unspecified: Secondary | ICD-10-CM | POA: Insufficient documentation

## 2017-06-16 DIAGNOSIS — F319 Bipolar disorder, unspecified: Secondary | ICD-10-CM | POA: Insufficient documentation

## 2017-06-16 DIAGNOSIS — R451 Restlessness and agitation: Secondary | ICD-10-CM | POA: Diagnosis not present

## 2017-06-16 DIAGNOSIS — R4689 Other symptoms and signs involving appearance and behavior: Secondary | ICD-10-CM | POA: Insufficient documentation

## 2017-06-16 DIAGNOSIS — F3113 Bipolar disorder, current episode manic without psychotic features, severe: Secondary | ICD-10-CM | POA: Diagnosis not present

## 2017-06-16 DIAGNOSIS — R440 Auditory hallucinations: Secondary | ICD-10-CM | POA: Diagnosis not present

## 2017-06-16 DIAGNOSIS — Z9114 Patient's other noncompliance with medication regimen: Secondary | ICD-10-CM | POA: Diagnosis not present

## 2017-06-16 DIAGNOSIS — Z79899 Other long term (current) drug therapy: Secondary | ICD-10-CM | POA: Diagnosis not present

## 2017-06-16 DIAGNOSIS — F39 Unspecified mood [affective] disorder: Secondary | ICD-10-CM | POA: Diagnosis not present

## 2017-06-16 DIAGNOSIS — Z046 Encounter for general psychiatric examination, requested by authority: Secondary | ICD-10-CM | POA: Diagnosis present

## 2017-06-16 DIAGNOSIS — F3110 Bipolar disorder, current episode manic without psychotic features, unspecified: Secondary | ICD-10-CM

## 2017-06-16 DIAGNOSIS — G4700 Insomnia, unspecified: Secondary | ICD-10-CM | POA: Diagnosis not present

## 2017-06-16 LAB — COMPREHENSIVE METABOLIC PANEL
ALBUMIN: 4.4 g/dL (ref 3.5–5.0)
ALT: 17 U/L (ref 17–63)
ANION GAP: 11 (ref 5–15)
AST: 19 U/L (ref 15–41)
Alkaline Phosphatase: 99 U/L (ref 38–126)
BILIRUBIN TOTAL: 0.5 mg/dL (ref 0.3–1.2)
CHLORIDE: 100 mmol/L — AB (ref 101–111)
CO2: 26 mmol/L (ref 22–32)
Calcium: 9.7 mg/dL (ref 8.9–10.3)
Creatinine, Ser: 0.67 mg/dL (ref 0.61–1.24)
GFR calc Af Amer: >60 mL/min (ref >60)
Glucose, Bld: 134 mg/dL — ABNORMAL HIGH (ref 65–99)
POTASSIUM: 3.5 mmol/L (ref 3.5–5.1)
Sodium: 137 mmol/L (ref 135–145)
TOTAL PROTEIN: 7.9 g/dL (ref 6.5–8.1)

## 2017-06-16 LAB — CBC
HEMATOCRIT: 44.2 % (ref 39.0–52.0)
HEMOGLOBIN: 15 g/dL (ref 13.0–17.0)
MCH: 31.1 pg (ref 26.0–34.0)
MCHC: 33.9 g/dL (ref 30.0–36.0)
MCV: 91.7 fL (ref 78.0–100.0)
Platelets: 303 10*3/uL (ref 150–400)
RBC: 4.82 MIL/uL (ref 4.22–5.81)
RDW: 13.5 % (ref 11.5–15.5)
WBC: 11.7 10*3/uL — ABNORMAL HIGH (ref 4.0–10.5)

## 2017-06-16 LAB — ETHANOL

## 2017-06-16 LAB — VALPROIC ACID LEVEL

## 2017-06-16 LAB — SALICYLATE LEVEL

## 2017-06-16 LAB — ACETAMINOPHEN LEVEL

## 2017-06-16 NOTE — ED Provider Notes (Signed)
Level V caveat psychiatric complaint. Patient under involuntary psychiatric commitment. Affidavit and petition for involuntary commitment followed by Theodoro Parma. Affidavit states Patient has history of bipolar disorder and schizophrenia not taking his medications. Getting aggressive with people around him walking around a mostly auditory hallucinations and talking to himself. On exam patient is alert no distress. As pressure speech. Cooperative. Glasgow Coma Score 15. Gait normal. Oriented 3.   Orlie Dakin, MD 06/16/17 [phone]

## 2017-06-16 NOTE — ED Provider Notes (Signed)
[facility] DEPT Provider Note   CSN: [id number] Arrival date & time: 06/16/17  [place]     History   Chief Complaint Chief Complaint  Patient presents with  . Medical Clearance  . IVC    HPI Franklin Ayers is a 56 y.o. male presenting with aggressive behavior.  Level V caveat due to psychiatric disorder  Patient brought in by [employer] under IVC, taken out by his mother.  Per patient, he does not know why he is here. He knows that his family was concerned, but is not believe he has been acting abnormal. He denies suicidal ideations, homicidal ideations, or auditory or visual hallucinations. He does not know if he has been taking his medications. He does not know when he last slept. He is unsure if he carries any psychiatric diagnoses. He denies alcohol or drug use. He reports he smokes cigarettes daily.  Per IVC paperwork, patient's mother was concerned about patient's aggressive behavior and medication noncompliance. Additionally, patient walking around aimlessly responding to auditory hallucinations.   HPI  Past Medical History:  Diagnosis Date  . Mental disorder   . Schizoaffective disorder [facility])     Patient Active Problem List   Diagnosis Date Noted  . Bipolar affective disorder, current episode manic ([place]) 12/23/2016    Past Surgical History:  Procedure Laterality Date  . TONSILLECTOMY         Home Medications    Prior to Admission medications   Medication Sig Start Date End Date Taking? Authorizing Provider  divalproex (DEPAKOTE ER) 500 MG 24 hr tablet Take 1,000 mg by mouth at bedtime. 12/16/16   [provider]  OLANZapine (ZYPREXA) 5 MG tablet Take 5 mg by mouth at bedtime.  03/23/17   [provider]  QUEtiapine (SEROQUEL) 100 MG tablet Take 100 mg by mouth 3 (three) times daily. 10/19/16   [provider]    Family History Family History  Problem Relation Age of Onset  . Cancer Other   . Stroke Other     Social  History Social History  Substance Use Topics  . Smoking status: Current Every Day Smoker    Packs/day: 2.00    Types: Cigarettes  . Smokeless tobacco: Never Used     Comment: refused  . Alcohol use Yes     Comment: occasional     Allergies   Penicillins   Review of Systems Review of Systems  Unable to perform ROS: Psychiatric disorder     Physical Exam Updated Vital Signs BP (!) 148/82 (BP Location: Right Arm)   Pulse 86   Temp 98.2 F (36.8 C) (Oral)   Resp 18   SpO2 93%   Physical Exam  Constitutional: He is oriented to person, place, and time. He appears well-developed and well-nourished. No distress.  HENT:  Head: Normocephalic and atraumatic.  Eyes: EOM are normal.  Neck: Normal range of motion.  Cardiovascular: Normal rate, regular rhythm and intact distal pulses.   Pulmonary/Chest: Effort normal and breath sounds normal. No respiratory distress. He has no wheezes.  Abdominal: Soft. He exhibits no distension. There is no tenderness.  Musculoskeletal: Normal range of motion.  Moves all extremities appropriately. Ambulatory without difficulty  Neurological: He is alert and oriented to person, place, and time.  Skin: Skin is warm. No rash noted.  Psychiatric: His speech is rapid and/or pressured. He is agitated.  Nursing note and vitals reviewed.    ED Treatments / Results  Labs (all labs ordered are listed, but only  abnormal results are displayed) Labs Reviewed  COMPREHENSIVE METABOLIC PANEL - Abnormal; Notable for the following:       Result Value   Chloride 100 (*)    Glucose, Bld 134 (*)    BUN <5 (*)    All other components within normal limits  CBC - Abnormal; Notable for the following:    WBC 11.7 (*)    All other components within normal limits  ACETAMINOPHEN LEVEL - Abnormal; Notable for the following:    Acetaminophen (Tylenol), Serum <10 (*)    All other components within normal limits  VALPROIC ACID LEVEL - Abnormal; Notable for the  following:    Valproic Acid Lvl <10 (*)    All other components within normal limits  ETHANOL  SALICYLATE LEVEL  RAPID URINE DRUG SCREEN, HOSP PERFORMED    EKG  EKG Interpretation None       Radiology No results found.  Procedures Procedures (including critical care time)  Medications Ordered in ED Medications - No data to display   Initial Impression / Assessment and Plan / ED Course  I have reviewed the triage vital signs and the nursing notes.  Pertinent labs & imaging results that were available during my care of the patient were reviewed by me and considered in my medical decision making (see chart for details).      Patient presenting under IVC due to unknown amount of time of aggressive behavior and medication noncompliance. Physical exam shows patient is agitated, but denies homicidal ideations and is not explicitly threatening. No other acute abnormality. Will order basic labs, acetaminophen, ethanol, salicylate, valproic. Will order UDS.  Labs reassuring. Valproic level less than 10, likely not taking his Depakote. UDS to be collected. At this time, patient appears medically clear for TTS consult.  Discussed with [facility], patient to be admitted for inpatient treatment.  Final Clinical Impressions(s) / ED Diagnoses   Final diagnoses:  Schizoaffective disorder, bipolar type [facility])    New Prescriptions New Prescriptions   No medications on file     Franchot Heidelberg, PA-C 06/17/17 0038    Orlie Dakin, MD 06/23/17 [phone]

## 2017-06-16 NOTE — ED Notes (Signed)
Hourly rounding reveals patient in room. No complaints, stable, in no acute distress. Q15 minute rounds and monitoring via [employer] to continue. Refuses to talk to TTS when camera placed in room.

## 2017-06-16 NOTE — ED Notes (Signed)
Pt. Transferred to [facility] from ED to room 36 after screening for contraband. Report to include Situation, Background, Assessment and Recommendations from RN. Pt. Oriented to unit including Q15 minute rounds as well as the security cameras for their protection. Patient is alert and oriented, warm and dry in no acute distress. Patient denies SI, HI, and AVH. Pt. Encouraged to let me know if needs arise.

## 2017-06-16 NOTE — BH Assessment (Addendum)
Tele Assessment Note   Patient Name: Franklin Ayers MRN: [id number] Referring Physician: Franchot Heidelberg, PA-C Location of Patient: [facility] Location of Provider: [place] is an 56 y.o. male who presents to the ED under IVC initiated by his mother. According to the IVC, the pt has been refusing to take his psych meds, hallucinating, talking to people that aren't really there, and becoming aggressive with his family. TTS spoke with the petitioner of the IVC and she states the pt used to live in a group home but refused treatment and left. Pt's mother states she fears the pt may try to harm himself due to not taking his medications. Pt's mother stated the pt "walks around all over [employer] and looks like he is talking to himself."  Pt has hx of inpt hospitalizations c/o similar concerns. Pt refused to engage with this Probation officer. Pt turned his back towards the camera and when asked to communicate, pt got out of his bed and told the nursing staff to "get this thing out of here." Pt stated he was not going to talk with counselor and refused to disclose any of his symptoms. EDP note states the pt denies SI, HI, and denies AVH. Pt reported to EDP that he does not know when he last slept.   Per Lindon Romp, NP pt is recommended for inpt treatment. TTS to seek placement. EDP Caccavale, Sophia, PA-C notified of recommendation. Rondel Baton, RN also notified of disposition.   Diagnosis: Schizoaffective D/O (per chart)  Past Medical History:  Past Medical History:  Diagnosis Date  . Mental disorder   . Schizoaffective disorder [facility])     Past Surgical History:  Procedure Laterality Date  . TONSILLECTOMY      Family History:  Family History  Problem Relation Age of Onset  . Cancer Other   . Stroke Other     Social History:  reports that he has been smoking Cigarettes.  He has been smoking about 2.00 packs per day. He has never used smokeless  tobacco. He reports that he drinks alcohol. He reports that he does not use drugs.  Additional Social History:  Alcohol / Drug Use Pain Medications: See MAR Prescriptions: See MAR Over the Counter: See MAR History of alcohol / drug use?:  (UTA, pt refused)  CIWA: CIWA-Ar BP: (!) 148/82 Pulse Rate: 86 COWS:    PATIENT STRENGTHS: (choose at least two) Warehouse manager means  Allergies:  Allergies  Allergen Reactions  . Penicillins Shortness Of Breath    .Has patient had a PCN reaction causing immediate rash, facial/tongue/throat swelling, SOB or lightheadedness with hypotension: yes Has patient had a PCN reaction causing severe rash involving mucus membranes or skin necrosis: no Has patient had a PCN reaction that required hospitalization: no Has patient had a PCN reaction occurring within the last 10 years: no If all of the above answers are "NO", then may proceed with Cephalosporin use.     Home Medications:  (Not in a hospital admission)  OB/GYN Status:  No LMP for male patient.  General Assessment Data [facility] of [facility] TTS Assessment: In system Is this a Tele or Face-to-Face Assessment?: Tele Assessment Is this an Initial Assessment or a Re-assessment for this encounter?: Initial Assessment Marital status:  (UTA) Is patient pregnant?: No Pregnancy Status: No Living Arrangements:  (unknown) Can pt return to current living arrangement?: Yes Admission Status: Involuntary Is patient capable of signing voluntary admission?: No Referral  Source: Self/Family/Friend Insurance type: MEDICARE     Crisis Care Plan Living Arrangements:  (unknown) Name of Psychiatrist: UTA Name of Therapist: UTA  Education Status Is patient currently in school?: No Highest grade of school patient has completed: UNKNOWN  Risk to self with the past 6 months Suicidal Ideation: No Has patient been a risk to self within the past 6 months prior to admission? :  No Suicidal Intent: No Has patient had any suicidal intent within the past 6 months prior to admission? : No Is patient at risk for suicide?: No Suicidal Plan?: No Has patient had any suicidal plan within the past 6 months prior to admission? : No Access to Means: No What has been your use of drugs/alcohol within the last 12 months?: UTA, pt refuses to engage with Probation officer  Previous Attempts/Gestures:  (UTA) Triggers for Past Attempts: Unknown Intentional Self Injurious Behavior:  (UTA) Family Suicide History: Unknown Recent stressful life event(s): Other (Comment) (per IVC, not taking psych medication ) Persecutory voices/beliefs?: No Depression: Yes Depression Symptoms: Feeling angry/irritable Substance abuse history and/or treatment for substance abuse?:  (UNKNOWN) Suicide prevention information given to non-admitted patients: Not applicable  Risk to Others within the past 6 months Homicidal Ideation: No Does patient have any lifetime risk of violence toward others beyond the six months prior to admission? : Yes (comment) (PER IVC, PT AGGRESSIVE WITH FAMILY ) Thoughts of Harm to Others: No-Not Currently Present/Within Last 6 Months Comment - Thoughts of Harm to Others: MOM REPORTS PT REFUSES TO TAKE MEDICATION AND WHEN SHE MENTIONS IT TO HIM, HE BECOMES AGGRESSIVE  Current Homicidal Intent: No Current Homicidal Plan: No Access to Homicidal Means: No History of harm to others?: No Assessment of Violence: None Noted Does patient have access to weapons?: No Criminal Charges Pending?: No Does patient have a court date: No Is patient on probation?: No  Psychosis Hallucinations: Visual, Auditory (PER IVC AND REPORTS FROM MOM ) Delusions: None noted  Mental Status Report Appearance/Hygiene: In scrubs Eye Contact: Poor Motor Activity: Freedom of movement Speech: Aggressive Level of Consciousness: Irritable Mood: Angry, Irritable Affect: Constricted, Angry Anxiety Level:  Minimal Thought Processes: Unable to Assess Judgement: Unable to Assess Orientation: Unable to assess Obsessive Compulsive Thoughts/Behaviors: Unable to Assess  Cognitive Functioning Concentration: Unable to Assess Memory: Unable to Assess IQ: Average Insight: Unable to Assess Impulse Control: Unable to Assess Appetite:  (UTA) Sleep: Unable to Assess Vegetative Symptoms: Unable to Assess  ADLScreening [facility] Assessment Services) Patient's cognitive ability adequate to safely complete daily activities?: Yes Patient able to express need for assistance with ADLs?: Yes Independently performs ADLs?: Yes (appropriate for developmental age)  Prior Inpatient Therapy Prior Inpatient Therapy: Yes Prior Therapy Dates: 2018 and many other admissions  Prior Therapy Facilty/Provider(s): [facility]  Reason for Treatment: Schizoaffective d/o  Prior Outpatient Therapy Prior Outpatient Therapy: Yes Prior Therapy Dates: CURRENT Prior Therapy Facilty/Provider(s): [facility] Reason for Treatment: MED MANAGEMENT  Does patient have an ACCT team?: No Does patient have Intensive In-House Services?  : No Does patient have [facility] services? : No Does patient have P4CC services?: No  ADL Screening (condition at time of admission) Patient's cognitive ability adequate to safely complete daily activities?: Yes Is the patient deaf or have difficulty hearing?: No Does the patient have difficulty seeing, even when wearing glasses/contacts?: No Does the patient have difficulty concentrating, remembering, or making decisions?: No Patient able to express need for assistance with ADLs?: Yes Does the patient  have difficulty dressing or bathing?: No Independently performs ADLs?: Yes (appropriate for developmental age) Does the patient have difficulty walking or climbing stairs?: No Weakness of Legs: None Weakness of Arms/Hands: None  Home Assistive Devices/Equipment Home  Assistive Devices/Equipment: None    Abuse/Neglect Assessment (Assessment to be complete while patient is alone) Physical Abuse:  (UTA) Verbal Abuse:  (UTA) Sexual Abuse:  (UTA) Exploitation of patient/patient's resources:  (UTA) Self-Neglect:  (UTA)     Advance Directives (For Healthcare) Does Patient Have a Medical Advance Directive?: No Would patient like information on creating a medical advance directive?: No - Patient declined    Additional Information 1:1 In Past 12 Months?: No CIRT Risk: Yes Elopement Risk: Yes Does patient have medical clearance?: Yes     Disposition:  Disposition Initial Assessment Completed for this Encounter: Yes Disposition of Patient: Inpatient treatment program Type of inpatient treatment program: Adult (PER JASON BERRY, NP)  This service was provided via telemedicine using a 2-way, interactive audio and video technology.  Names of all persons participating in this telemedicine service and their role in this encounter. Name: Lind Covert Role: TTS Counselor  Name: Franklin Ayers Role: Patient           Lyanne Co 06/16/2017 10:40 PM

## 2017-06-16 NOTE — ED Notes (Signed)
Pt is refusing blood work, PA at bedside, and Therapist, sports notified

## 2017-06-16 NOTE — ED Triage Notes (Signed)
Per IVC paperwork, states patient has a mental health history-states he has'nt been taking his meds-becoming aggressive with his mother, wandering around neighborhood-states auditory hallucinations

## 2017-06-16 NOTE — BH Assessment (Signed)
[place] Assessment Progress Note   Per Lindon Romp, NP pt is recommended for inpt treatment. TTS to seek placement. EDP Caccavale, Sophia, PA-C notified of recommendation. Rondel Baton, RN also notified of disposition.    Lind Covert, MSW, LCSW Therapeutic Triage Specialist  [phone]

## 2017-06-17 DIAGNOSIS — F17210 Nicotine dependence, cigarettes, uncomplicated: Secondary | ICD-10-CM

## 2017-06-17 DIAGNOSIS — F312 Bipolar disorder, current episode manic severe with psychotic features: Secondary | ICD-10-CM

## 2017-06-17 DIAGNOSIS — R451 Restlessness and agitation: Secondary | ICD-10-CM

## 2017-06-17 DIAGNOSIS — R0601 Orthopnea: Secondary | ICD-10-CM

## 2017-06-17 DIAGNOSIS — F39 Unspecified mood [affective] disorder: Secondary | ICD-10-CM | POA: Diagnosis not present

## 2017-06-17 DIAGNOSIS — R443 Hallucinations, unspecified: Secondary | ICD-10-CM

## 2017-06-17 DIAGNOSIS — G4700 Insomnia, unspecified: Secondary | ICD-10-CM

## 2017-06-17 DIAGNOSIS — F319 Bipolar disorder, unspecified: Secondary | ICD-10-CM | POA: Diagnosis not present

## 2017-06-17 LAB — RAPID URINE DRUG SCREEN, HOSP PERFORMED
Amphetamines: NOT DETECTED
BARBITURATES: NOT DETECTED
Benzodiazepines: NOT DETECTED
Cocaine: NOT DETECTED
Opiates: NOT DETECTED
TETRAHYDROCANNABINOL: NOT DETECTED

## 2017-06-17 MED ORDER — OLANZAPINE 5 MG PO TABS
5.0000 mg | ORAL_TABLET | Freq: Every day | ORAL | Status: DC
  Administered 2017-06-17 – 2017-06-18 (×2): 5 mg via ORAL
  Filled 2017-06-17 (×2): qty 1

## 2017-06-17 MED ORDER — QUETIAPINE FUMARATE ER 50 MG PO TB24
100.0000 mg | ORAL_TABLET | Freq: Three times a day (TID) | ORAL | Status: DC
  Administered 2017-06-17 – 2017-06-19 (×6): 100 mg via ORAL
  Filled 2017-06-17 (×8): qty 2

## 2017-06-17 MED ORDER — DIVALPROEX SODIUM ER 500 MG PO TB24
1000.0000 mg | ORAL_TABLET | Freq: Every day | ORAL | Status: DC
  Administered 2017-06-17 – 2017-06-18 (×2): 1000 mg via ORAL
  Filled 2017-06-17 (×2): qty 2

## 2017-06-17 NOTE — ED Notes (Signed)
Eating lunch, took meds w/o difficulty

## 2017-06-17 NOTE — ED Notes (Signed)
Hourly rounding reveals patient sleeping in room. No complaints, stable, in no acute distress. Q15 minute rounds and monitoring via Security Cameras to continue. 

## 2017-06-17 NOTE — ED Notes (Signed)
Up to the bathroom 

## 2017-06-17 NOTE — ED Notes (Signed)
Report to include Situation, Background, Assessment, and Recommendations received from [facility] Patient alert and oriented, warm and dry, in no acute distress. Patient denies SI, HI, AVH and pain. Patient made aware of Q15 minute rounds and security cameras for their safety. Patient instructed to come to me with needs or concerns.

## 2017-06-17 NOTE — Consult Note (Signed)
[place] Psychiatry Consult   Reason for Consult:  Hallucinations Referring Physician:  EDP Patient Identification: Franklin Ayers No MRN:  [id number] Principal Diagnosis: Bipolar affective disorder, current episode manic [facility]) Diagnosis:   Patient Active Problem List   Diagnosis Date Noted  . Bipolar affective disorder, current episode manic ([place]) [F31.9] 12/23/2016    Total Time spent with patient: 45 minutes  Subjective:   Franklin Ayers is a 56 y.o. male patient admitted with bizarre behavior and hallucinations.  HPI:  Patient was seen and chart reviewed along with psychiatry and discussed with treatment team. Patient came to the [facility] with IVC petition for psychosis from his mother. Patient has not been suffering with chronic schizoaffective disorder and become psychotic, agitated, aggressive to family members, hallucinating and not compliant with his psych medication management. He was a resident of group home and left the group home with none complaint with program and medication treatment. Patient has been exhibiting bizarre behaviors like roaming around the streets [place], and talking top himself. He has history of mental illness, past acute psychiatric hospitalizations. Patient present with acute psychosis. bizarre behavior, non compliant with assessment saying you figure it out. Patient minimizes his symptoms of depression, SI/HI and psychosis and or refuse to contribute for the psych evaluation.   Past Psychiatric History: as above  Risk to Self: None Risk to Others: None Prior Inpatient Therapy: Prior Inpatient Therapy: Yes Prior Therapy Dates: 2018 and many other admissions  Prior Therapy Facilty/Provider(s): [facility], [facility]  Reason for Treatment: Schizoaffective d/o Prior Outpatient Therapy: Prior Outpatient Therapy: Yes Prior Therapy Dates: CURRENT Prior Therapy Facilty/Provider(s): [place] Reason for Treatment: MED MANAGEMENT   Does patient have an ACCT team?: No Does patient have Intensive In-House Services?  : No Does patient have [facility] services? : No Does patient have P4CC services?: No  Past Medical History:  Past Medical History:  Diagnosis Date  . Mental disorder   . Schizoaffective disorder [facility])     Past Surgical History:  Procedure Laterality Date  . TONSILLECTOMY     Family History:  Family History  Problem Relation Age of Onset  . Cancer Other   . Stroke Other    Family Psychiatric  History: Unknown Social History:  History  Alcohol Use  . Yes    Comment: occasional     History  Drug Use No    Social History   Social History  . Marital status: Single    Spouse name: N/A  . Number of children: N/A  . Years of education: N/A   Social History Main Topics  . Smoking status: Current Every Day Smoker    Packs/day: 2.00    Types: Cigarettes  . Smokeless tobacco: Never Used     Comment: refused  . Alcohol use Yes     Comment: occasional  . Drug use: No  . Sexual activity: No   Other Topics Concern  . None   Social History Narrative  . None   Additional Social History:    Allergies:   Allergies  Allergen Reactions  . Penicillins Shortness Of Breath    .Has patient had a PCN reaction causing immediate rash, facial/tongue/throat swelling, SOB or lightheadedness with hypotension: yes Has patient had a PCN reaction causing severe rash involving mucus membranes or skin necrosis: no Has patient had a PCN reaction that required hospitalization: no Has patient had a PCN reaction occurring within the last 10 years: no If all of the above answers are "  NO", then may proceed with Cephalosporin use.     Labs:  Results for orders placed or performed during the hospital encounter of 06/16/17 (from the past 48 hour(s))  Comprehensive metabolic panel     Status: Abnormal   Collection Time: 06/16/17  8:25 PM  Result Value Ref Range   Sodium 137 135 - 145 mmol/L   Potassium  3.5 3.5 - 5.1 mmol/L   Chloride 100 (L) 101 - 111 mmol/L   CO2 26 22 - 32 mmol/L   Glucose, Bld 134 (H) 65 - 99 mg/dL   BUN <5 (L) 6 - 20 mg/dL   Creatinine, Ser 0.67 0.61 - 1.24 mg/dL   Calcium 9.7 8.9 - 10.3 mg/dL   Total Protein 7.9 6.5 - 8.1 g/dL   Albumin 4.4 3.5 - 5.0 g/dL   AST 19 15 - 41 U/L   ALT 17 17 - 63 U/L   Alkaline Phosphatase 99 38 - 126 U/L   Total Bilirubin 0.5 0.3 - 1.2 mg/dL   GFR calc non Af Amer >60 >60 mL/min   GFR calc Af Amer >60 >60 mL/min    Comment: (NOTE) The eGFR has been calculated using the CKD EPI equation. This calculation has not been validated in all clinical situations. eGFR's persistently <60 mL/min signify possible Chronic Kidney Disease.    Anion gap 11 5 - 15  Ethanol     Status: None   Collection Time: 06/16/17  8:25 PM  Result Value Ref Range   Alcohol, Ethyl (B) <10 <10 mg/dL    Comment:        LOWEST DETECTABLE LIMIT FOR SERUM ALCOHOL IS 10 mg/dL FOR MEDICAL PURPOSES ONLY Please note change in reference range.   CBC     Status: Abnormal   Collection Time: 06/16/17  8:25 PM  Result Value Ref Range   WBC 11.7 (H) 4.0 - 10.5 K/uL   RBC 4.82 4.22 - 5.81 MIL/uL   Hemoglobin 15.0 13.0 - 17.0 g/dL   HCT 44.2 39.0 - 52.0 %   MCV 91.7 78.0 - 100.0 fL   MCH 31.1 26.0 - 34.0 pg   MCHC 33.9 30.0 - 36.0 g/dL   RDW 13.5 11.5 - 15.5 %   Platelets 303 977 - 414 K/uL  Salicylate level     Status: None   Collection Time: 06/16/17  8:25 PM  Result Value Ref Range   Salicylate Lvl <2.3 2.8 - 30.0 mg/dL  Acetaminophen level     Status: Abnormal   Collection Time: 06/16/17  8:25 PM  Result Value Ref Range   Acetaminophen (Tylenol), Serum <10 (L) 10 - 30 ug/mL    Comment:        THERAPEUTIC CONCENTRATIONS VARY SIGNIFICANTLY. A RANGE OF 10-30 ug/mL MAY BE AN EFFECTIVE CONCENTRATION FOR MANY PATIENTS. HOWEVER, SOME ARE BEST TREATED AT CONCENTRATIONS OUTSIDE THIS RANGE. ACETAMINOPHEN CONCENTRATIONS >150 ug/mL AT 4 HOURS AFTER INGESTION  AND >50 ug/mL AT 12 HOURS AFTER INGESTION ARE OFTEN ASSOCIATED WITH TOXIC REACTIONS.   Valproic acid level     Status: Abnormal   Collection Time: 06/16/17  8:26 PM  Result Value Ref Range   Valproic Acid Lvl <10 (L) 50.0 - 100.0 ug/mL  Urine rapid drug screen (hosp performed)     Status: None   Collection Time: 06/17/17  8:18 AM  Result Value Ref Range   Opiates NONE DETECTED NONE DETECTED   Cocaine NONE DETECTED NONE DETECTED   Benzodiazepines NONE DETECTED NONE DETECTED  Amphetamines NONE DETECTED NONE DETECTED   Tetrahydrocannabinol NONE DETECTED NONE DETECTED   Barbiturates NONE DETECTED NONE DETECTED    Comment:        DRUG SCREEN FOR MEDICAL PURPOSES ONLY.  IF CONFIRMATION IS NEEDED FOR ANY PURPOSE, NOTIFY LAB WITHIN 5 DAYS.        LOWEST DETECTABLE LIMITS FOR URINE DRUG SCREEN Drug Class       Cutoff (ng/mL) Amphetamine      1000 Barbiturate      200 Benzodiazepine   544 Tricyclics       920 Opiates          300 Cocaine          300 THC              50     Current Facility-Administered Medications  Medication Dose Route Frequency Provider Last Rate Last Dose  . divalproex (DEPAKOTE ER) 24 hr tablet 1,000 mg  1,000 mg Oral QHS Ethelene Hal, NP      . OLANZapine [facility]) tablet 5 mg  5 mg Oral QHS Ethelene Hal, NP      . QUEtiapine (SEROQUEL XR) 24 hr tablet 100 mg  100 mg Oral TID Ethelene Hal, NP   100 mg at 06/17/17 1222   Current Outpatient Prescriptions  Medication Sig Dispense Refill  . divalproex (DEPAKOTE ER) 500 MG 24 hr tablet Take 1,000 mg by mouth at bedtime.    Marland Kitchen OLANZapine (ZYPREXA) 5 MG tablet Take 5 mg by mouth at bedtime.     Marland Kitchen QUEtiapine (SEROQUEL) 100 MG tablet Take 100 mg by mouth 3 (three) times daily.      Musculoskeletal: Strength & Muscle Tone: within normal limits Gait & Station: normal Patient leans: N/A  Psychiatric Specialty Exam: Physical Exam  Constitutional: He appears well-developed and  well-nourished.  Respiratory: Effort normal.  Musculoskeletal: Normal range of motion.  Neurological: He is alert.    Review of Systems  Cardiovascular: Positive for orthopnea.  Psychiatric/Behavioral: Positive for depression and hallucinations. Negative for memory loss, substance abuse and suicidal ideas. The patient has insomnia. The patient is not nervous/anxious.     Blood pressure 123/88, pulse 81, temperature 98.5 F (36.9 C), temperature source Oral, resp. rate 18, SpO2 100 %.There is no height or weight on file to calculate BMI.  General Appearance: Casual  Eye Contact:  Fair  Speech:  Pressured  Volume:  Normal  Mood:  Anxious, Depressed and Irritable  Affect:  Congruent  Thought Process:  Coherent  Orientation:  Full (Time, Place, and Person)  Thought Content:  Illogical  Suicidal Thoughts:  No  Homicidal Thoughts:  No  Memory:  Immediate;   Good Recent;   Fair Remote;   Fair  Judgement:  Poor  Insight:  Lacking  Psychomotor Activity:  Normal  Concentration:  Concentration: Fair and Attention Span: Fair  Recall:  [place] of Knowledge:  Good  Language:  Good  Akathisia:  No  Handed:  Right  AIMS (if indicated):     Assets:  Agricultural consultant Housing Physical Health  ADL's:  Intact  Cognition:  WNL  Sleep:        Treatment Plan Summary: Daily contact with patient to assess and evaluate symptoms and progress in treatment and Medication management  -Crisis Stabilization Medication management: -Depakote 1000 mg QHS for mood stabilization -Zyprexa 5 mg QHS for mood stabilization -Seroquel 100 mg TID for mood stabilization  Disposition: Recommend psychiatric  Inpatient admission when medically cleared.  TTS to seek placement  Ethelene Hal, NP 06/17/2017 1:11 PM   Patient seen, chart reviewed, case discuss with treatment team including physician extender and formulated treatment plan. Will restart home psych  medication and obtain collateral from mother and possible group home. Patient meets criteria for acute psych admission for crisis stabilization and safety monitoring. Reviewed the information documented and agree with the treatment plan.  Beckam Abdulaziz 06/17/2017 3:58 PM

## 2017-06-18 DIAGNOSIS — R451 Restlessness and agitation: Secondary | ICD-10-CM | POA: Diagnosis not present

## 2017-06-18 DIAGNOSIS — F312 Bipolar disorder, current episode manic severe with psychotic features: Secondary | ICD-10-CM | POA: Diagnosis not present

## 2017-06-18 DIAGNOSIS — F39 Unspecified mood [affective] disorder: Secondary | ICD-10-CM | POA: Diagnosis not present

## 2017-06-18 DIAGNOSIS — F319 Bipolar disorder, unspecified: Secondary | ICD-10-CM | POA: Diagnosis not present

## 2017-06-18 DIAGNOSIS — F17210 Nicotine dependence, cigarettes, uncomplicated: Secondary | ICD-10-CM | POA: Diagnosis not present

## 2017-06-18 DIAGNOSIS — G4700 Insomnia, unspecified: Secondary | ICD-10-CM | POA: Diagnosis not present

## 2017-06-18 NOTE — ED Notes (Signed)
Hourly rounding reveals patient sleeping in room. No complaints, stable, in no acute distress. Q15 minute rounds and monitoring via Security Cameras to continue. 

## 2017-06-18 NOTE — Progress Notes (Signed)
Patient has been referred to the following facilities: [facility]: no answer; send referral to [phone] [place]- Left voicemail at 10:58am [facility]- no answer [facility]- send referral for review [facility]- send referral for review [facility]- send referral for review [facility]- send referral for review [place]- No beds for high acuity   At capacity: [facility]- per    CSW will continue to seek placement.   Lucius Conn, [place] Worker [facility] Ph: [phone]

## 2017-06-18 NOTE — ED Notes (Signed)
Report to include situation, background, assessment and recommendations from Proofreader. Patient sleeping, respirations regular and unlabored. Q15 minute rounds and security camera observation to continue.

## 2017-06-18 NOTE — ED Notes (Signed)
Pt sleeping at present, no distress noted, calm & cooperative.  Monitoring for safety, Q 15 min checks in effect. 

## 2017-06-18 NOTE — ED Notes (Signed)
Hourly rounding reveals patient sleeping in room. No complaints, stable, in no acute distress. Q15 minute rounds and monitoring via [employer] to continue. Sleeping

## 2017-06-18 NOTE — Consult Note (Signed)
[facility] Consult   Reason for Consult:  hallucinating Referring Physician:  EDP Patient Identification: Franklin Ayers MRN:  [id number] Principal Diagnosis: Bipolar affective disorder, current episode manic [facility]) Diagnosis:   Patient Active Problem List   Diagnosis Date Noted  . Bipolar affective disorder, current episode manic ([place]) [F31.9] 12/23/2016    Total Time spent with patient: 45 minutes  Subjective:   Franklin Ayers is a 56 y.o. male patient admitted under IVC for hallucinating, not taking his psych meds, and being aggressive.  HPI:  Pt was seen and chart reviewed with treatment team. Pt was placed in the [place] under IVC by his mother because he was being aggressive, off his psych meds, talking to people not present, and hallucinating. During assessment, Pt refuses to answer questions and instead says "I can't understand you repeatedly and I don't know you figure it out." Pt was started on his home medications in the [facility] and today was a bit more clear and answered some questions with one or two word answers. Pt will then be quiet and refuse to answer. Pt lives with his mother. Inpatient psychiatric admission recommended for crisis stabilization and medication management.   Past Psychiatric History: As above  Risk to Self: None Risk to Others: None Prior Inpatient Therapy: Prior Inpatient Therapy: Yes Prior Therapy Dates: 2018 and many other admissions  Prior Therapy Facilty/Provider(s): [facility], [facility]  Reason for Treatment: Schizoaffective d/o Prior Outpatient Therapy: Prior Outpatient Therapy: Yes Prior Therapy Dates: CURRENT Prior Therapy Facilty/Provider(s): [place] Reason for Treatment: MED MANAGEMENT  Does patient have an ACCT team?: No Does patient have Intensive In-House Services?  : No Does patient have [facility] services? : No Does patient have P4CC services?: No  Past Medical History:  Past Medical  History:  Diagnosis Date  . Mental disorder   . Schizoaffective disorder [facility])     Past Surgical History:  Procedure Laterality Date  . TONSILLECTOMY     Family History:  Family History  Problem Relation Age of Onset  . Cancer Other   . Stroke Other    Family Psychiatric  History: Unknown Social History:  History  Alcohol Use  . Yes    Comment: occasional     History  Drug Use No    Social History   Social History  . Marital status: Single    Spouse name: N/A  . Number of children: N/A  . Years of education: N/A   Social History Main Topics  . Smoking status: Current Every Day Smoker    Packs/day: 2.00    Types: Cigarettes  . Smokeless tobacco: Never Used     Comment: refused  . Alcohol use Yes     Comment: occasional  . Drug use: No  . Sexual activity: No   Other Topics Concern  . None   Social History Narrative  . None   Additional Social History:    Allergies:   Allergies  Allergen Reactions  . Penicillins Shortness Of Breath    .Has patient had a PCN reaction causing immediate rash, facial/tongue/throat swelling, SOB or lightheadedness with hypotension: yes Has patient had a PCN reaction causing severe rash involving mucus membranes or skin necrosis: no Has patient had a PCN reaction that required hospitalization: no Has patient had a PCN reaction occurring within the last 10 years: no If all of the above answers are "NO", then may proceed with Cephalosporin use.     Labs:  Results for orders  placed or performed during the hospital encounter of 06/16/17 (from the past 48 hour(s))  Comprehensive metabolic panel     Status: Abnormal   Collection Time: 06/16/17  8:25 PM  Result Value Ref Range   Sodium 137 135 - 145 mmol/L   Potassium 3.5 3.5 - 5.1 mmol/L   Chloride 100 (L) 101 - 111 mmol/L   CO2 26 22 - 32 mmol/L   Glucose, Bld 134 (H) 65 - 99 mg/dL   BUN <5 (L) 6 - 20 mg/dL   Creatinine, Ser 0.67 0.61 - 1.24 mg/dL   Calcium 9.7 8.9 -  10.3 mg/dL   Total Protein 7.9 6.5 - 8.1 g/dL   Albumin 4.4 3.5 - 5.0 g/dL   AST 19 15 - 41 U/L   ALT 17 17 - 63 U/L   Alkaline Phosphatase 99 38 - 126 U/L   Total Bilirubin 0.5 0.3 - 1.2 mg/dL   GFR calc non Af Amer >60 >60 mL/min   GFR calc Af Amer >60 >60 mL/min    Comment: (NOTE) The eGFR has been calculated using the CKD EPI equation. This calculation has not been validated in all clinical situations. eGFR's persistently <60 mL/min signify possible Chronic Kidney Disease.    Anion gap 11 5 - 15  Ethanol     Status: None   Collection Time: 06/16/17  8:25 PM  Result Value Ref Range   Alcohol, Ethyl (B) <10 <10 mg/dL    Comment:        LOWEST DETECTABLE LIMIT FOR SERUM ALCOHOL IS 10 mg/dL FOR MEDICAL PURPOSES ONLY Please note change in reference range.   CBC     Status: Abnormal   Collection Time: 06/16/17  8:25 PM  Result Value Ref Range   WBC 11.7 (H) 4.0 - 10.5 K/uL   RBC 4.82 4.22 - 5.81 MIL/uL   Hemoglobin 15.0 13.0 - 17.0 g/dL   HCT 44.2 39.0 - 52.0 %   MCV 91.7 78.0 - 100.0 fL   MCH 31.1 26.0 - 34.0 pg   MCHC 33.9 30.0 - 36.0 g/dL   RDW 13.5 11.5 - 15.5 %   Platelets 303 188 - 416 K/uL  Salicylate level     Status: None   Collection Time: 06/16/17  8:25 PM  Result Value Ref Range   Salicylate Lvl <6.0 2.8 - 30.0 mg/dL  Acetaminophen level     Status: Abnormal   Collection Time: 06/16/17  8:25 PM  Result Value Ref Range   Acetaminophen (Tylenol), Serum <10 (L) 10 - 30 ug/mL    Comment:        THERAPEUTIC CONCENTRATIONS VARY SIGNIFICANTLY. A RANGE OF 10-30 ug/mL MAY BE AN EFFECTIVE CONCENTRATION FOR MANY PATIENTS. HOWEVER, SOME ARE BEST TREATED AT CONCENTRATIONS OUTSIDE THIS RANGE. ACETAMINOPHEN CONCENTRATIONS >150 ug/mL AT 4 HOURS AFTER INGESTION AND >50 ug/mL AT 12 HOURS AFTER INGESTION ARE OFTEN ASSOCIATED WITH TOXIC REACTIONS.   Valproic acid level     Status: Abnormal   Collection Time: 06/16/17  8:26 PM  Result Value Ref Range   Valproic  Acid Lvl <10 (L) 50.0 - 100.0 ug/mL  Urine rapid drug screen (hosp performed)     Status: None   Collection Time: 06/17/17  8:18 AM  Result Value Ref Range   Opiates NONE DETECTED NONE DETECTED   Cocaine NONE DETECTED NONE DETECTED   Benzodiazepines NONE DETECTED NONE DETECTED   Amphetamines NONE DETECTED NONE DETECTED   Tetrahydrocannabinol NONE DETECTED NONE DETECTED   Barbiturates NONE  DETECTED NONE DETECTED    Comment:        DRUG SCREEN FOR MEDICAL PURPOSES ONLY.  IF CONFIRMATION IS NEEDED FOR ANY PURPOSE, NOTIFY LAB WITHIN 5 DAYS.        LOWEST DETECTABLE LIMITS FOR URINE DRUG SCREEN Drug Class       Cutoff (ng/mL) Amphetamine      1000 Barbiturate      200 Benzodiazepine   267 Tricyclics       124 Opiates          300 Cocaine          300 THC              50     Current Facility-Administered Medications  Medication Dose Route Frequency Provider Last Rate Last Dose  . divalproex (DEPAKOTE ER) 24 hr tablet 1,000 mg  1,000 mg Oral QHS Ethelene Hal, NP   1,000 mg at 06/17/17 2112  . OLANZapine (ZYPREXA) tablet 5 mg  5 mg Oral QHS Ethelene Hal, NP   5 mg at 06/17/17 2112  . QUEtiapine (SEROQUEL XR) 24 hr tablet 100 mg  100 mg Oral TID Ethelene Hal, NP   100 mg at 06/18/17 5809   Current Outpatient Prescriptions  Medication Sig Dispense Refill  . divalproex (DEPAKOTE ER) 500 MG 24 hr tablet Take 1,000 mg by mouth at bedtime.    Marland Kitchen OLANZapine (ZYPREXA) 5 MG tablet Take 5 mg by mouth at bedtime.     Marland Kitchen QUEtiapine (SEROQUEL) 100 MG tablet Take 100 mg by mouth 3 (three) times daily.      Musculoskeletal: Strength & Muscle Tone: within normal limits Gait & Station: normal Patient leans: N/A  Psychiatric Specialty Exam: Physical Exam  Constitutional: He appears well-developed and well-nourished.  Respiratory: Effort normal.  Musculoskeletal: Normal range of motion.  Neurological: He is alert.  Psychiatric: His speech is normal. His mood appears  anxious. His affect is angry. He is agitated. He expresses impulsivity. He exhibits a depressed mood.    Review of Systems  Psychiatric/Behavioral: Positive for depression and hallucinations. Negative for memory loss, substance abuse and suicidal ideas. The patient is nervous/anxious. The patient does not have insomnia.   All other systems reviewed and are negative.   Blood pressure 122/76, pulse 75, temperature 98.2 F (36.8 C), temperature source Oral, resp. rate 16, SpO2 96 %.There is no height or weight on file to calculate BMI.  General Appearance: Disheveled  Eye Contact:  Fair  Speech:  Blocked  Volume:  Normal  Mood:  Anxious, Depressed and Irritable  Affect:  Congruent and Depressed  Thought Process:  Disorganized  Orientation:  Full (Time, Place, and Person)  Thought Content:  Illogical  Suicidal Thoughts:  No  Homicidal Thoughts:  No  Memory:  Immediate;   Good Recent;   Fair Remote;   Fair  Judgement:  Fair  Insight:  Fair  Psychomotor Activity:  Decreased  Concentration:  Concentration: Good and Attention Span: Good  Recall:  [place] of Knowledge:  Good  Language:  Good  Akathisia:  No  Handed:  Right  AIMS (if indicated):     Assets:  Agricultural consultant Housing Physical Health Resilience Social Support  ADL's:  Intact  Cognition:  WNL  Sleep:        Treatment Plan Summary: Daily contact with patient to assess and evaluate symptoms and progress in treatment and Medication management  -Crisis Stabilization Continue these medications: -Depakote  ER 1,000 mg QHS for mood stabilization -Zyprexa 5 mg QHS for mood stabilization -Seroquel XR 100 TID for mood stabilization  Disposition: Recommend psychiatric Inpatient admission when medically cleared.  TTS to seek placement  Ethelene Hal, NP 06/18/2017 3:26 PM   Patient seen for this face-to-face psychiatric evaluation, case discussed with physician extender in  treatment team and formulated treatment plan. Reviewed the information documented and agree with the treatment plan.  Johnhenry Tippin 06/18/2017 5:25 PM

## 2017-06-19 DIAGNOSIS — Z9114 Patient's other noncompliance with medication regimen: Secondary | ICD-10-CM | POA: Diagnosis not present

## 2017-06-19 DIAGNOSIS — F3113 Bipolar disorder, current episode manic without psychotic features, severe: Secondary | ICD-10-CM | POA: Diagnosis not present

## 2017-06-19 DIAGNOSIS — F39 Unspecified mood [affective] disorder: Secondary | ICD-10-CM | POA: Diagnosis not present

## 2017-06-19 DIAGNOSIS — F319 Bipolar disorder, unspecified: Secondary | ICD-10-CM | POA: Diagnosis not present

## 2017-06-19 DIAGNOSIS — F17210 Nicotine dependence, cigarettes, uncomplicated: Secondary | ICD-10-CM | POA: Diagnosis not present

## 2017-06-19 MED ORDER — DIVALPROEX SODIUM ER 500 MG PO TB24: 1000.0000 mg | ORAL_TABLET | Freq: Every day | ORAL | 0 refills | Status: DC

## 2017-06-19 MED ORDER — OLANZAPINE 5 MG PO TABS: 5.0000 mg | ORAL_TABLET | Freq: Every day | ORAL | 0 refills | Status: DC

## 2017-06-19 MED ORDER — QUETIAPINE FUMARATE 100 MG PO TABS: 100.0000 mg | ORAL_TABLET | Freq: Three times a day (TID) | ORAL | 0 refills | Status: DC

## 2017-06-19 NOTE — [facility] Suicide Risk Assessment (Signed)
Suicide Risk Assessment  Discharge Assessment   [facility] Discharge Suicide Risk Assessment   Principal Problem: Bipolar affective disorder, current episode manic [facility]) Discharge Diagnoses:  Patient Active Problem List   Diagnosis Date Noted  . Bipolar affective disorder, current episode manic ([place]) [F31.9] 12/23/2016    Priority: High    Total Time spent with patient: 30 minutes   Musculoskeletal: Strength & Muscle Tone: within normal limits Gait & Station: normal Patient leans: N/A  Psychiatric Specialty Exam: Physical Exam  Constitutional: He is oriented to person, place, and time. He appears well-developed and well-nourished.  HENT:  Head: Normocephalic.  Neck: Normal range of motion.  Respiratory: Effort normal.  Musculoskeletal: Normal range of motion.  Neurological: He is alert and oriented to person, place, and time.  Psychiatric: He has a normal mood and affect. His speech is normal and behavior is normal. Judgment and thought content normal. Cognition and memory are normal.    Review of Systems  All other systems reviewed and are negative.   Blood pressure 118/74, pulse 75, temperature 98.2 F (36.8 C), temperature source Oral, resp. rate 18, SpO2 96 %.There is no height or weight on file to calculate BMI.  General Appearance: Casual  Eye Contact:  Good  Speech:  Normal Rate  Volume:  Normal  Mood:  Euthymic  Affect:  Congruent  Thought Process:  Coherent and Descriptions of Associations: Intact  Orientation:  Full (Time, Place, and Person)  Thought Content:  WDL and Logical  Suicidal Thoughts:  No  Homicidal Thoughts:  No  Memory:  Immediate;   Good Recent;   Good Remote;   Good  Judgement:  Fair  Insight:  Fair  Psychomotor Activity:  Normal  Concentration:  Concentration: Good and Attention Span: Good  Recall:  Good  Fund of Knowledge:  Good  Language:  Good  Akathisia:  No  Handed:  Right  AIMS (if indicated):     Assets:  Housing Leisure  Time Physical Health Resilience Social Support  ADL's:  Intact  Cognition:  WNL  Sleep:       Mental Status Per Nursing Assessment::   On Admission:   mania  Demographic Factors:  Male and Caucasian  Loss Factors: NA  Historical Factors: NA  Risk Reduction Factors:   Sense of responsibility to family, Living with another person, especially a relative, Positive social support and Positive therapeutic relationship  Continued Clinical Symptoms:  None  Cognitive Features That Contribute To Risk:  None    Suicide Risk:  Minimal: No identifiable suicidal ideation.  Patients presenting with no risk factors but with morbid ruminations; may be classified as minimal risk based on the severity of the depressive symptoms    Plan Of Care/Follow-up recommendations:  Activity:  as tolerated Diet:  heart healthy diet  Lorn Butcher, NP 06/19/2017, 2:28 PM

## 2017-06-19 NOTE — ED Notes (Signed)
Hourly rounding reveals patient sleeping in room. No complaints, stable, in no acute distress. Q15 minute rounds and monitoring via Security Cameras to continue. 

## 2017-06-19 NOTE — Progress Notes (Signed)
06/19/17 1359:  LRT went to pt room, introduced self and offered activities.  Pt declined.   Victorino Sparrow, LRT/CTRS

## 2017-06-19 NOTE — Progress Notes (Signed)
Subjective/Objective No new subjective & objective note has been filed under this hospital service since the last note was generated.   Scheduled Meds: . divalproex  1,000 mg Oral QHS  . OLANZapine  5 mg Oral QHS  . QUEtiapine  100 mg Oral TID   Continuous Infusions: PRN Meds:  Vital signs in last 24 hours: Temp:  [98.2 F (36.8 C)] 98.2 F (36.8 C) (10/07 1235) Pulse Rate:  [75-90] 75 (10/08 0631) Resp:  [16-18] 18 (10/08 0631) BP: (115-122)/(72-76) 118/74 (10/08 0631) SpO2:  [96 %] 96 % (10/08 0631)  Intake/Output last 3 shifts: No intake/output data recorded. Intake/Output this shift: No intake/output data recorded.  Problem Assessment/Plan No new Assessment & Plan notes have been filed under this hospital service since the last note was generated. Service: Psychiatry

## 2017-06-19 NOTE — Discharge Instructions (Signed)
For your behavioral health needs, you are advised to follow up with your regular outpatient psychiatry provider.  If you do not currently have a provider, contact one of the providers listed below at your earliest opportunity to ask about scheduling an intake appointment:       [facility]. [address] [place], [place] [address]      [phone]       [address] [place], [place] [address]      [phone]       [facility] [place]      [address], [address]      [place], [place] [address]      [phone]

## 2017-06-19 NOTE — Consult Note (Signed)
[facility] Psychiatry Consult   Reason for Consult:  Mania  Referring Physician:  EDP Patient Identification: Franklin Ayers MRN:  [id number] Principal Diagnosis: Bipolar affective disorder, current episode manic [facility]) Diagnosis:   Patient Active Problem List   Diagnosis Date Noted  . Bipolar affective disorder, current episode manic ([place]) [F31.9] 12/23/2016    Priority: High    Total Time spent with patient: 30 minutes  Subjective:   Franklin Ayers is a 56 y.o. male patient has stabilized.  HPI:  56 yo male who presented to the ED under IVC for mania and noncompliance of medications.  His medications were restarted and he stabilized.  No mania noted, calm and cooperative, no suicidal/homicidal ideations, hallucinations, or substance abuse.  Stable for discharge with encouragement to continue his compliance.  Past Psychiatric History: bipolar affective disorder  Risk to Self: Suicidal Ideation: No Suicidal Intent: No Is patient at risk for suicide?: No Suicidal Plan?: No Access to Means: No What has been your use of drugs/alcohol within the last 12 months?: UTA, pt refuses to engage with Probation officer  Triggers for Past Attempts: Unknown Intentional Self Injurious Behavior:  (UTA) Risk to Others: None Prior Inpatient Therapy: Prior Inpatient Therapy: Yes Prior Therapy Dates: 2018 and many other admissions  Prior Therapy Facilty/Provider(s): [facility]  Reason for Treatment: Schizoaffective d/o Prior Outpatient Therapy: Prior Outpatient Therapy: Yes Prior Therapy Dates: CURRENT Prior Therapy Facilty/Provider(s): [place] Reason for Treatment: MED MANAGEMENT  Does patient have an ACCT team?: No Does patient have Intensive In-House Services?  : No Does patient have [facility] services? : No Does patient have P4CC services?: No  Past Medical History:  Past Medical History:  Diagnosis Date  . Mental disorder   . Schizoaffective disorder  [facility])     Past Surgical History:  Procedure Laterality Date  . TONSILLECTOMY     Family History:  Family History  Problem Relation Age of Onset  . Cancer Other   . Stroke Other    Family Psychiatric  History: unknown Social History:  History  Alcohol Use  . Yes    Comment: occasional     History  Drug Use No    Social History   Social History  . Marital status: Single    Spouse name: N/A  . Number of children: N/A  . Years of education: N/A   Social History Main Topics  . Smoking status: Current Every Day Smoker    Packs/day: 2.00    Types: Cigarettes  . Smokeless tobacco: Never Used     Comment: refused  . Alcohol use Yes     Comment: occasional  . Drug use: No  . Sexual activity: No   Other Topics Concern  . None   Social History Narrative  . None   Additional Social History:    Allergies:   Allergies  Allergen Reactions  . Penicillins Shortness Of Breath    .Has patient had a PCN reaction causing immediate rash, facial/tongue/throat swelling, SOB or lightheadedness with hypotension: yes Has patient had a PCN reaction causing severe rash involving mucus membranes or skin necrosis: no Has patient had a PCN reaction that required hospitalization: no Has patient had a PCN reaction occurring within the last 10 years: no If all of the above answers are "NO", then may proceed with Cephalosporin use.     Labs: No results found for this or any previous visit (from the past 48 hour(s)).  Current Facility-Administered Medications  Medication Dose  Route Frequency Provider Last Rate Last Dose  . divalproex (DEPAKOTE ER) 24 hr tablet 1,000 mg  1,000 mg Oral QHS Ethelene Hal, NP   1,000 mg at 06/18/17 2100  . OLANZapine (ZYPREXA) tablet 5 mg  5 mg Oral QHS Ethelene Hal, NP   5 mg at 06/18/17 2100  . QUEtiapine (SEROQUEL XR) 24 hr tablet 100 mg  100 mg Oral TID Ethelene Hal, NP   100 mg at 06/19/17 1103   Current Outpatient  Prescriptions  Medication Sig Dispense Refill  . divalproex (DEPAKOTE ER) 500 MG 24 hr tablet Take 1,000 mg by mouth at bedtime.    Marland Kitchen OLANZapine (ZYPREXA) 5 MG tablet Take 5 mg by mouth at bedtime.     Marland Kitchen QUEtiapine (SEROQUEL) 100 MG tablet Take 100 mg by mouth 3 (three) times daily.      Musculoskeletal: Strength & Muscle Tone: within normal limits Gait & Station: normal Patient leans: N/A  Psychiatric Specialty Exam: Physical Exam  Constitutional: He is oriented to person, place, and time. He appears well-developed and well-nourished.  HENT:  Head: Normocephalic.  Neck: Normal range of motion.  Respiratory: Effort normal.  Musculoskeletal: Normal range of motion.  Neurological: He is alert and oriented to person, place, and time.  Psychiatric: He has a normal mood and affect. His speech is normal and behavior is normal. Judgment and thought content normal. Cognition and memory are normal.    Review of Systems  All other systems reviewed and are negative.   Blood pressure 118/74, pulse 75, temperature 98.2 F (36.8 C), temperature source Oral, resp. rate 18, SpO2 96 %.There is no height or weight on file to calculate BMI.  General Appearance: Casual  Eye Contact:  Good  Speech:  Normal Rate  Volume:  Normal  Mood:  Euthymic  Affect:  Congruent  Thought Process:  Coherent and Descriptions of Associations: Intact  Orientation:  Full (Time, Place, and Person)  Thought Content:  WDL and Logical  Suicidal Thoughts:  No  Homicidal Thoughts:  No  Memory:  Immediate;   Good Recent;   Good Remote;   Good  Judgement:  Fair  Insight:  Fair  Psychomotor Activity:  Normal  Concentration:  Concentration: Good and Attention Span: Good  Recall:  Good  Fund of Knowledge:  Good  Language:  Good  Akathisia:  No  Handed:  Right  AIMS (if indicated):     Assets:  Housing Leisure Time Physical Health Resilience Social Support  ADL's:  Intact  Cognition:  WNL  Sleep:         Treatment Plan Summary: Daily contact with patient to assess and evaluate symptoms and progress in treatment, Medication management and Plan bipolar affective disorder, most recent episode, mania:  -Crisis stabilization -Medication management:  Continued Depakote 1000 mg at bedtime for mood stabilization, Zyprexa 5 mg at bedtime for mania, and Seroquel 100 mg TID for stabilization -Individual counseling  Disposition: No evidence of imminent risk to self or others at present.    Waylan Boga, NP 06/19/2017 11:22 AM  Patient seen face-to-face for psychiatric evaluation, chart reviewed and case discussed with the physician extender and developed treatment plan. Reviewed the information documented and agree with the treatment plan. Corena Pilgrim, MD

## 2017-06-19 NOTE — ED Notes (Signed)
Pt discharged safely with discharge instructions and RX reviewed.  Pt was calm and cooperative and waiting to find a ride home.  All belongings were returned to pt.

## 2017-06-19 NOTE — BH Assessment (Signed)
[facility] Assessment Progress Note  Per Corena Pilgrim, MD, this pt does not require psychiatric hospitalization at this time.  Pt presents under IVC initiated by pt's mother, which Dr Darleene Cleaver has rescinded.  Pt is to be discharged from [facility] with recommendation to follow up with his regular outpatient provider.  Since this is unknown, referral information is also to be provided for other area providers.  This has been included in pt's discharge instructions.  Pt's nurse, Nena Jordan, has been notified.  Jalene Mullet, [place] Triage Specialist [phone]

## 2017-07-17 ENCOUNTER — Emergency Department (HOSPITAL_COMMUNITY)
Admission: EM | Admit: 2017-07-17 | Discharge: 2017-07-19 | Disposition: A | Discharge status: Home / Self Care | Payer: Medicare Other | Attending: Emergency Medicine | Admitting: Emergency Medicine

## 2017-07-17 ENCOUNTER — Encounter (HOSPITAL_COMMUNITY): Payer: Self-pay | Admitting: Emergency Medicine

## 2017-07-17 DIAGNOSIS — R443 Hallucinations, unspecified: Secondary | ICD-10-CM

## 2017-07-17 DIAGNOSIS — F250 Schizoaffective disorder, bipolar type: Secondary | ICD-10-CM | POA: Insufficient documentation

## 2017-07-17 DIAGNOSIS — R45851 Suicidal ideations: Secondary | ICD-10-CM | POA: Diagnosis not present

## 2017-07-17 DIAGNOSIS — F17210 Nicotine dependence, cigarettes, uncomplicated: Secondary | ICD-10-CM | POA: Diagnosis not present

## 2017-07-17 DIAGNOSIS — F22 Delusional disorders: Secondary | ICD-10-CM | POA: Diagnosis not present

## 2017-07-17 DIAGNOSIS — Z720 Tobacco use: Secondary | ICD-10-CM

## 2017-07-17 DIAGNOSIS — Z008 Encounter for other general examination: Secondary | ICD-10-CM

## 2017-07-17 DIAGNOSIS — R442 Other hallucinations: Secondary | ICD-10-CM | POA: Diagnosis not present

## 2017-07-17 DIAGNOSIS — R450 Nervousness: Secondary | ICD-10-CM | POA: Diagnosis not present

## 2017-07-17 DIAGNOSIS — F259 Schizoaffective disorder, unspecified: Secondary | ICD-10-CM | POA: Diagnosis present

## 2017-07-17 LAB — COMPREHENSIVE METABOLIC PANEL
ALT: 17 U/L (ref 17–63)
ANION GAP: 10 (ref 5–15)
AST: 17 U/L (ref 15–41)
Albumin: 4.1 g/dL (ref 3.5–5.0)
Alkaline Phosphatase: 98 U/L (ref 38–126)
BUN: 10 mg/dL (ref 6–20)
CHLORIDE: 102 mmol/L (ref 101–111)
CO2: 25 mmol/L (ref 22–32)
Calcium: 9.3 mg/dL (ref 8.9–10.3)
Creatinine, Ser: 0.61 mg/dL (ref 0.61–1.24)
Glucose, Bld: 93 mg/dL (ref 65–99)
POTASSIUM: 3.7 mmol/L (ref 3.5–5.1)
Sodium: 137 mmol/L (ref 135–145)
Total Bilirubin: 1.6 mg/dL — ABNORMAL HIGH (ref 0.3–1.2)
Total Protein: 7.3 g/dL (ref 6.5–8.1)

## 2017-07-17 LAB — CBC WITH DIFFERENTIAL/PLATELET
BASOS ABS: 0.1 10*3/uL (ref 0.0–0.1)
Basophils Relative: 1 %
EOS PCT: 2 %
Eosinophils Absolute: 0.2 10*3/uL (ref 0.0–0.7)
HCT: 43.2 % (ref 39.0–52.0)
Hemoglobin: 14.7 g/dL (ref 13.0–17.0)
LYMPHS PCT: 22 %
Lymphs Abs: 2.1 10*3/uL (ref 0.7–4.0)
MCH: 31 pg (ref 26.0–34.0)
MCHC: 34 g/dL (ref 30.0–36.0)
MCV: 91.1 fL (ref 78.0–100.0)
MONO ABS: 0.6 10*3/uL (ref 0.1–1.0)
Monocytes Relative: 6 %
Neutro Abs: 6.5 10*3/uL (ref 1.7–7.7)
Neutrophils Relative %: 69 %
PLATELETS: 252 10*3/uL (ref 150–400)
RBC: 4.74 MIL/uL (ref 4.22–5.81)
RDW: 13.2 % (ref 11.5–15.5)
WBC: 9.6 10*3/uL (ref 4.0–10.5)

## 2017-07-17 LAB — SALICYLATE LEVEL

## 2017-07-17 LAB — ETHANOL: Alcohol, Ethyl (B): <10 mg/dL (ref <10)

## 2017-07-17 LAB — ACETAMINOPHEN LEVEL

## 2017-07-17 MED ORDER — ZOLPIDEM TARTRATE 5 MG PO TABS: 5.0000 mg | ORAL_TABLET | Freq: Every evening | ORAL | Status: DC | PRN

## 2017-07-17 MED ORDER — NICOTINE 21 MG/24HR TD PT24: 21.0000 mg | MEDICATED_PATCH | Freq: Every day | TRANSDERMAL | Status: DC

## 2017-07-17 MED ORDER — OLANZAPINE 5 MG PO TABS
5.0000 mg | ORAL_TABLET | Freq: Every day | ORAL | Status: DC
  Administered 2017-07-17 – 2017-07-18 (×2): 5 mg via ORAL
  Filled 2017-07-17 (×2): qty 1

## 2017-07-17 MED ORDER — QUETIAPINE FUMARATE 100 MG PO TABS
100.0000 mg | ORAL_TABLET | Freq: Three times a day (TID) | ORAL | Status: DC
  Administered 2017-07-17 – 2017-07-19 (×6): 100 mg via ORAL
  Filled 2017-07-17 (×6): qty 1

## 2017-07-17 MED ORDER — ONDANSETRON HCL 4 MG PO TABS: 4.0000 mg | ORAL_TABLET | Freq: Three times a day (TID) | ORAL | Status: DC | PRN

## 2017-07-17 MED ORDER — ALUM & MAG HYDROXIDE-SIMETH 200-200-20 MG/5ML PO SUSP: 30.0000 mL | Freq: Four times a day (QID) | ORAL | Status: DC | PRN

## 2017-07-17 MED ORDER — DIVALPROEX SODIUM ER 500 MG PO TB24
1000.0000 mg | ORAL_TABLET | Freq: Every day | ORAL | Status: DC
  Administered 2017-07-17 – 2017-07-18 (×2): 1000 mg via ORAL
  Filled 2017-07-17 (×2): qty 2

## 2017-07-17 MED ORDER — ACETAMINOPHEN 325 MG PO TABS: 650.0000 mg | ORAL_TABLET | ORAL | Status: DC | PRN

## 2017-07-17 NOTE — BH Assessment (Signed)
[facility] Assessment Progress Note  Case was staffed with Reita Cliche DNP who recommended patient be monitored and nutritional intake be observed. Patient also will be evaluated for medication management and seen in the a.m by psychiatrist.

## 2017-07-17 NOTE — BH Assessment (Addendum)
Assessment Note  Franklin Ayers is an 56 y.o. male that presents this date voluntary after 3 days of not eating. Patient is nonverbal with this Probation officer although nodded "yes" if asked collateral could be obtained from mother. Franklin Ayers [phone] contacted this writer and informed that patient stopped eating 3 days ago, stopping liquids 1 day ago . Mother is unsure why patient has stopped eating/drinking. Patient is non-verbal with this Probation officer and only nods "yes" when asked if mother could be contacted. Mother states patient has been off medications for over three weeks and had been receiving services from [facility] but has been refusing to see them. Patient has a extensive mental health history with his last admission on 06/16/17 when patient was under IVC due to increased aggression. Patient currently resides with his mother and can return to that residence when discharged. Patient does not have a history of S/I, H/I or AVH although patient's mother states patient has been getting verbally aggressive at home. Information to complete assessment was obtained from mother and admission notes. Per notes, patient is observed to be calm and cooperative, but mostly non-verbal. He wanted water with his medication, but denied any other need. Would not answer any assessment questions. Patient agreed with Waylan Boga, DNP, that he will eat if he has soft food. He reported to her that he is not eating because his mouth hurts. Lord DNP has a rapport with patient due to the patient being under her care at another facility. Patient has a of schizoaffective disorder and bipolar disorder, patient is a unreliable/poor historian therefore information is limited. Per [employer], patient's mother called because the patient stopped eating 3 days ago and stopped drinking one day ago, and needed psychiatric evaluation. Apparently she may be getting IVC paperwork out however at this time no IVC has been initiated. If so, [facility]  has not received the IVC at the time of assessment. When asked about why the patient is here he states "that person wanted me to stay like this for 7 years, it thinks I want to eat with it, so I had to leave my house". He does not clarify who he is talking about, and at times he seems to be responding to internal stimuli. He denies SI, HI, AVH, drug use, or alcohol use. He admits to being a tobacco user. He has no medical complaints at this time. He admits that he has not been taking his Seroquel, Depakote, and Zyprexa however he doesn't know when he stopped taking them, he states that "they expired 60 days after I got them", but can't remember when he got them. He states he ate food yesterday and drank water this morning but had no thing to eat today. Of note, chart review reveals he was admitted to the psychiatric hospital 10/5-8/18 under IVC for agitation/aggressive behavior, and then cleared on 06/19/17 for outpatient management/follow-up. Case was staffed with Reita Cliche DNP who recommended patient be monitored and nutritional intake be observed. Patient also will be evaluated for medication management and seen in the a.m by psychiatrist.        Diagnosis: F25.0 Schizoaffective, bipolar type   Past Medical History:  Past Medical History:  Diagnosis Date  . Mental disorder   . Schizoaffective disorder [facility])     Past Surgical History:  Procedure Laterality Date  . TONSILLECTOMY      Family History:  Family History  Problem Relation Age of Onset  . Cancer Other   . Stroke Other  Social History:  reports that he has been smoking cigarettes.  He has been smoking about 2.00 packs per day. he has never used smokeless tobacco. He reports that he drinks alcohol. He reports that he does not use drugs.  Additional Social History:  Alcohol / Drug Use Pain Medications: See MAR Prescriptions: See MAR Over the Counter: See MAR History of alcohol / drug use?: Yes Longest period of sobriety  (when/how long): Unknown Negative Consequences of Use: (Denies) Withdrawal Symptoms: (Denies) Substance #1 Name of Substance 1: Tobacco 1 - Age of First Use: 23 1 - Amount (size/oz): 1 pack of cigarettes daily  1 - Frequency: daily 1 - Duration: Since age 53 1 - Last Use / Amount: 07/17/17 1/2 pack  CIWA: CIWA-Ar BP: 125/70 Pulse Rate: (!) 55 COWS:    Allergies:  Allergies  Allergen Reactions  . Penicillins Shortness Of Breath    .Has patient had a PCN reaction causing immediate rash, facial/tongue/throat swelling, SOB or lightheadedness with hypotension: no Has patient had a PCN reaction causing severe rash involving mucus membranes or skin necrosis: no Has patient had a PCN reaction that required hospitalization: no Has patient had a PCN reaction occurring within the last 10 years: no If all of the above answers are "NO", then may proceed with Cephalosporin use.     Home Medications:  (Not in a hospital admission)  OB/GYN Status:  No LMP for male patient.  General Assessment Data Location of Assessment: [facility] TTS Assessment: In system Is this a Tele or Face-to-Face Assessment?: Face-to-Face Is this an Initial Assessment or a Re-assessment for this encounter?: Initial Assessment Marital status: Single Maiden name: NA Is patient pregnant?: No Pregnancy Status: No Living Arrangements: Parent Can pt return to current living arrangement?: Yes Admission Status: Voluntary Is patient capable of signing voluntary admission?: Yes Referral Source: Self/Family/Friend Insurance type: Medicaid  Medical Screening Exam ([place]) Medical Exam completed: Yes  Crisis Care Plan Living Arrangements: Parent Legal Guardian: (NA) Name of Psychiatrist: None Name of Therapist: None  Education Status Is patient currently in school?: No Current Grade: (NA) Highest grade of school patient has completed: (Some college) Name of school: (NA) Contact person: (NA)  Risk to  self with the past 6 months Suicidal Ideation: No Has patient been a risk to self within the past 6 months prior to admission? : No Suicidal Intent: No Has patient had any suicidal intent within the past 6 months prior to admission? : No Is patient at risk for suicide?: No Suicidal Plan?: No Has patient had any suicidal plan within the past 6 months prior to admission? : No Access to Means: No What has been your use of drugs/alcohol within the last 12 months?: Current tobacco  Previous Attempts/Gestures: No How many times?: 0 Other Self Harm Risks: NA Triggers for Past Attempts: Unknown Intentional Self Injurious Behavior: None Family Suicide History: No Recent stressful life event(s): Other (Comment)(Unknown) Persecutory voices/beliefs?: No Depression: No Depression Symptoms: (NA) Substance abuse history and/or treatment for substance abuse?: No Suicide prevention information given to non-admitted patients: Not applicable  Risk to Others within the past 6 months Homicidal Ideation: No Does patient have any lifetime risk of violence toward others beyond the six months prior to admission? : Yes (comment)(Per previous admission aggression with family) Thoughts of Harm to Others: No Comment - Thoughts of Harm to Others: (None) Current Homicidal Intent: No Current Homicidal Plan: No Access to Homicidal Means: No Identified Victim: NA History of harm  to others?: No Assessment of Violence: None Noted Violent Behavior Description: NA Does patient have access to weapons?: No Criminal Charges Pending?: No Does patient have a court date: No Is patient on probation?: No  Psychosis Hallucinations: None noted Delusions: None noted  Mental Status Report Appearance/Hygiene: In scrubs Eye Contact: Fair Motor Activity: Freedom of movement Speech: Aggressive Level of Consciousness: Irritable Mood: Anxious Affect: Anxious, Irritable Anxiety Level: Moderate Thought Processes: Unable  to Assess Judgement: Unable to Assess Orientation: Unable to assess Obsessive Compulsive Thoughts/Behaviors: Unable to Assess  Cognitive Functioning Concentration: Unable to Assess Memory: Unable to Assess IQ: Average Insight: Poor Impulse Control: Poor Appetite: Poor Weight Loss: (Pt has not been eating for 3 days ) Weight Gain: (UTA) Sleep: Unable to Assess Total Hours of Sleep: (UTA) Vegetative Symptoms: None  ADLScreening [facility] Assessment Services) Patient's cognitive ability adequate to safely complete daily activities?: Yes Patient able to express need for assistance with ADLs?: Yes Independently performs ADLs?: Yes (appropriate for developmental age)  Prior Inpatient Therapy Prior Inpatient Therapy: Yes Prior Therapy Dates: 2018 and many other admissions  Prior Therapy Facilty/Provider(s): [facility], [facility]  Reason for Treatment: Schizoaffective d/o  Prior Outpatient Therapy Prior Outpatient Therapy: Yes Prior Therapy Dates: Earlier in 2018(Not for the last 3 weeks per mother) Prior Therapy Facilty/Provider(s): [facility] Reason for Treatment: Med mang Does patient have an ACCT team?: No(Not currently) Does patient have Intensive In-House Services?  : No Does patient have [facility] services? : No Does patient have P4CC services?: No  ADL Screening (condition at time of admission) Patient's cognitive ability adequate to safely complete daily activities?: Yes Is the patient deaf or have difficulty hearing?: No Does the patient have difficulty seeing, even when wearing glasses/contacts?: No Does the patient have difficulty concentrating, remembering, or making decisions?: No Patient able to express need for assistance with ADLs?: Yes Does the patient have difficulty dressing or bathing?: No Independently performs ADLs?: Yes (appropriate for developmental age) Does the patient have difficulty walking or climbing stairs?: No Weakness of Legs: None Weakness of  Arms/Hands: None  Home Assistive Devices/Equipment Home Assistive Devices/Equipment: None  Therapy Consults (therapy consults require a physician order) PT Evaluation Needed: No OT Evalulation Needed: No SLP Evaluation Needed: No Abuse/Neglect Assessment (Assessment to be complete while patient is alone) Physical Abuse: Denies Verbal Abuse: Denies Sexual Abuse: Denies Exploitation of patient/patient's resources: Denies Self-Neglect: Denies Values / Beliefs Cultural Requests During Hospitalization: None Spiritual Requests During Hospitalization: None Consults Spiritual Care Consult Needed: No Social Work Consult Needed: No Regulatory affairs officer (For Healthcare) Does Patient Have a Medical Advance Directive?: No Would patient like information on creating a medical advance directive?: No - Patient declined    Additional Information 1:1 In Past 12 Months?: No CIRT Risk: No Elopement Risk: No Does patient have medical clearance?: Yes     Disposition: Case was staffed with Reita Cliche DNP who recommended patient be monitored and nutritional intake be observed. Patient also will be evaluated for medication management and seen in the a.m by psychiatrist.     Disposition Initial Assessment Completed for this Encounter: Yes Disposition of Patient: Other dispositions Type of inpatient treatment program: Adult Other disposition(s): Other (Comment)(Monitor nutrition and observe)  On Site Evaluation by:   Reviewed with Physician:    Mamie Nick 07/17/2017 5:55 PM

## 2017-07-17 NOTE — ED Notes (Signed)
On admission to the [facility] pt is calm and cooperative, but mostly non-verbal. He wanted water with his medication, but denied any other need. Would not answer any assessment questions. Pt agreed with Waylan Boga, DNP, that he will eat if he has soft food. He reported to her that he is not eating because his mouth hurts.

## 2017-07-17 NOTE — ED Provider Notes (Signed)
[place] DEPT Provider Note   CSN: [id number] Arrival date & time: 07/17/17  1404     History   Chief Complaint Chief Complaint  Patient presents with  . Suicidal    HPI Franklin Ayers is a 56 y.o. male with a PMHx of schizoaffective disorder and bipolar disorder, who presents to the ED via [employer] voluntarily for mental health evaluation. LEVEL 5 CAVEAT DUE TO PSYCHIATRIC CONDITION, pt unreliable/poor historian therefore HPI/ROS limited. Per [employer], patient's mother called out because the patient stopped eating 3 days ago and stopped drinking one day ago, and needed psychiatric evaluation. Apparently she may be getting IVC paperwork out however at this time we have not received those. When asked about why he's here, the patient states "that person wanted me to stay like this for 7 years, it thinks I want to eat with it, so I had to leave my house". He does not clarify who he is talking about, and at times he seems to be responding to internal stimuli. He denies SI, HI, AVH, drug use, or alcohol use. He admits to being a tobacco user. He has no medical complaints at this time. He admits that he has not been taking his Seroquel, Depakote, and Zyprexa however he doesn't know when he stopped taking them, he states that "they expired 60 days after I got them", but can't remember when he got them. He states he ate food yesterday and drank water this morning but had no thing to eat today. Of note, chart review reveals he was admitted to the psychiatric hospital 10/5-8/18 under IVC for agitation/aggressive behavior, and then cleared on 06/19/17 for outpatient management/follow-up.    The history is provided by the patient, medical records and the police. The history is limited by the condition of the patient. No language interpreter was used.  Mental Health Problem  Presenting symptoms: no hallucinations, no homicidal ideas and no suicidal thoughts   Patient accompanied by:   Law enforcement Onset quality:  Unable to specify Timing:  Unable to specify Progression:  Unable to specify Chronicity:  Recurrent Context: noncompliance   Treatment compliance:  Unable to specify Relieved by:  None tried Worsened by:  Family interactions Ineffective treatments:  None tried Associated symptoms: no abdominal pain and no chest pain   Risk factors: hx of mental illness and recent psychiatric admission     Past Medical History:  Diagnosis Date  . Mental disorder   . Schizoaffective disorder [facility])     Patient Active Problem List   Diagnosis Date Noted  . Bipolar affective disorder, current episode manic ([place]) 12/23/2016    Past Surgical History:  Procedure Laterality Date  . TONSILLECTOMY         Home Medications    Prior to Admission medications   Medication Sig Start Date End Date Taking? Authorizing Provider  divalproex (DEPAKOTE ER) 500 MG 24 hr tablet Take 1,000 mg by mouth at bedtime. 12/16/16   [provider]  divalproex (DEPAKOTE ER) 500 MG 24 hr tablet Take 2 tablets (1,000 mg total) by mouth at bedtime. 06/19/17   Patrecia Pour, NP  OLANZapine (ZYPREXA) 5 MG tablet Take 1 tablet (5 mg total) by mouth at bedtime. 06/19/17   Patrecia Pour, NP  QUEtiapine (SEROQUEL) 100 MG tablet Take 1 tablet (100 mg total) by mouth 3 (three) times daily. 06/19/17   Patrecia Pour, NP    Family History Family History  Problem Relation Age of Onset  .  Cancer Other   . Stroke Other     Social History Social History   Tobacco Use  . Smoking status: Current Every Day Smoker    Packs/day: 2.00    Types: Cigarettes  . Smokeless tobacco: Never Used  . Tobacco comment: refused  Substance Use Topics  . Alcohol use: Yes    Comment: occasional  . Drug use: No     Allergies   Penicillins   Review of Systems Review of Systems  Unable to perform ROS: Psychiatric disorder  Constitutional: Negative for chills and fever.  Respiratory: Negative  for shortness of breath.   Cardiovascular: Negative for chest pain.  Gastrointestinal: Negative for abdominal pain, constipation, diarrhea, nausea and vomiting.  Genitourinary: Negative for dysuria and hematuria.  Musculoskeletal: Negative for arthralgias and myalgias.  Skin: Negative for color change.  Allergic/Immunologic: Negative for immunocompromised state.  Neurological: Negative for weakness and numbness.  Psychiatric/Behavioral: Negative for hallucinations, homicidal ideas and suicidal ideas.   LEVEL 5 CAVEAT DUE TO PSYCHIATRIC CONDITION  Physical Exam Updated Vital Signs BP 136/73 (BP Location: Right Arm)   Pulse 60   Temp 97.9 F (36.6 C) (Oral)   Resp 18   SpO2 95%   Physical Exam  Constitutional: He is oriented to person, place, and time. Vital signs are normal. He appears well-developed and well-nourished.  Non-toxic appearance. No distress.  Afebrile, nontoxic, NAD  HENT:  Head: Normocephalic and atraumatic.  Mouth/Throat: Oropharynx is clear and moist and mucous membranes are normal.  Eyes: Conjunctivae and EOM are normal. Right eye exhibits no discharge. Left eye exhibits no discharge.  Neck: Normal range of motion. Neck supple.  Cardiovascular: Normal rate, regular rhythm, normal heart sounds and intact distal pulses. Exam reveals no gallop and no friction rub.  No murmur heard. Pulmonary/Chest: Effort normal and breath sounds normal. No respiratory distress. He has no decreased breath sounds. He has no wheezes. He has no rhonchi. He has no rales.  Abdominal: Soft. Normal appearance and bowel sounds are normal. He exhibits no distension. There is no tenderness. There is no rigidity, no rebound, no guarding, no CVA tenderness, no tenderness at McBurney's point and negative Murphy's sign.  Musculoskeletal: Normal range of motion.  Neurological: He is alert and oriented to person, place, and time. He has normal strength. No sensory deficit.  Skin: Skin is warm, dry  and intact. No rash noted.  Psychiatric: His mood appears anxious. He is actively hallucinating (seems to be responding to internal stimuli). He expresses no homicidal and no suicidal ideation. He expresses no suicidal plans and no homicidal plans.  Slightly anxious affect, but pleasant and cooperative. Denies SI, HI, or AVH, although seems to be responding to internal stimuli talking to himself vs to a hallucination, but hard to tell.   Nursing note and vitals reviewed.    ED Treatments / Results  Labs (all labs ordered are listed, but only abnormal results are displayed) Labs Reviewed  COMPREHENSIVE METABOLIC PANEL - Abnormal; Notable for the following components:      Result Value   Total Bilirubin 1.6 (*)    All other components within normal limits  ACETAMINOPHEN LEVEL - Abnormal; Notable for the following components:   Acetaminophen (Tylenol), Serum <10 (*)    All other components within normal limits  CBC WITH DIFFERENTIAL/PLATELET  ETHANOL  SALICYLATE LEVEL  RAPID URINE DRUG SCREEN, HOSP PERFORMED    EKG  EKG Interpretation None       Radiology No results found.  Procedures Procedures (including critical care time)  Medications Ordered in ED Medications  divalproex (DEPAKOTE ER) 24 hr tablet 1,000 mg (not administered)  OLANZapine (ZYPREXA) tablet 5 mg (not administered)  QUEtiapine (SEROQUEL) tablet 100 mg (not administered)  acetaminophen (TYLENOL) tablet 650 mg (not administered)  zolpidem (AMBIEN) tablet 5 mg (not administered)  ondansetron (ZOFRAN) tablet 4 mg (not administered)  alum & mag hydroxide-simeth (MAALOX/MYLANTA) 200-200-20 MG/5ML suspension 30 mL (not administered)  nicotine (NICODERM CQ - dosed in mg/24 hours) patch 21 mg (not administered)     Initial Impression / Assessment and Plan / ED Course  I have reviewed the triage vital signs and the nursing notes.  Pertinent labs & imaging results that were available during my care of the  patient were reviewed by me and considered in my medical decision making (see chart for details).     56 y.o. male here because his mother stated he wasn't eating/drinking, apparently she may be taking out IVC paperwork but we haven't received any yet. Pt A&Ox3 but is a difficult historian therefore level 5 caveat applies. He states that a person wants him to stay for 7 years and thinks they want to eat with them, but it isn't clear who he's talking about. Seems to potentially be responding to internal stimuli, however hard to tell. Calm and cooperative during exam. Denies SI/HI/AVH/drug use/EtOH use, but endorses tobacco use so smoking cessation was encouraged. Noncompliant with meds, but doesn't know how long it's been. Was admitted to psych last month 10/5-8/18 under IVC for agitation/aggressive behavior but was ultimately discharged on 06/19/17 with recommendations to f/up outpatient. Will get psych clearance labs and reassess shortly.   4:44 PM CBC w/diff WNL. CMP with marginally elevated bili 1.6 which could be from dehydration, no abdominal pain/tenderness, doubt need for further emergent work up of this at this time. EtOH level undetectable. Salicylate and acetaminophen levels WNL. UDS pending, but does not interfere with med clearance. Pt medically cleared at this time. Psych hold orders and home med orders placed. Please see TTS notes for further documentation of care/dispo. PLEASE NOTE THAT PT IS HERE VOLUNTARILY AT THIS TIME, IF PT TRIES TO LEAVE THEY WOULD NEED REASSESSMENT TO SEE IF IVC PAPERWORK NEEDED TO BE TAKEN OUT. Pt stable at time of med clearance.      Final Clinical Impressions(s) / ED Diagnoses   Final diagnoses:  Medical clearance for psychiatric admission  Tobacco user  Hallucinations  Schizoaffective disorder, bipolar type [facility])    ED Discharge Orders    [address] [place], [place] 07/17/17 1646    Jola Schmidt, MD 07/17/17 1753

## 2017-07-17 NOTE — ED Triage Notes (Signed)
Pt  Brought by [employer] , voluntary, per mother pt stopped eating 3 days ago, stop liquid 1 day . Hx mental disorder. Admitted 1 month ago for similar issues. Failure to thrive, non verbal. Ambulated to room without difficulty . alert and oriented x 4 .   Pt unable to express his symptom yet will answer some assessment questions.

## 2017-07-17 NOTE — ED Notes (Signed)
Pt sleeping at present, no distress noted, calm & cooperative.  Monitoring for safety, Q 15 min checks in effect. 

## 2017-07-17 NOTE — ED Notes (Signed)
Bed: [facility] Expected date:  Expected time:  Means of arrival:  Comments: Hold for 29

## 2017-07-18 DIAGNOSIS — R450 Nervousness: Secondary | ICD-10-CM | POA: Diagnosis not present

## 2017-07-18 DIAGNOSIS — F250 Schizoaffective disorder, bipolar type: Secondary | ICD-10-CM | POA: Diagnosis not present

## 2017-07-18 DIAGNOSIS — F22 Delusional disorders: Secondary | ICD-10-CM

## 2017-07-18 DIAGNOSIS — F17210 Nicotine dependence, cigarettes, uncomplicated: Secondary | ICD-10-CM | POA: Diagnosis not present

## 2017-07-18 DIAGNOSIS — F419 Anxiety disorder, unspecified: Secondary | ICD-10-CM

## 2017-07-18 DIAGNOSIS — G4700 Insomnia, unspecified: Secondary | ICD-10-CM | POA: Diagnosis not present

## 2017-07-18 NOTE — Progress Notes (Signed)
07/18/17 1345:  LRT went to pt room to offer activities, pt was sleep.   Victorino Sparrow, LRT/CTRS

## 2017-07-18 NOTE — Consult Note (Signed)
[name] Consult   Reason for Consult:  Poor PO intake and medication noncompliance Referring Physician:  EDP Patient Identification: Franklin Ayers MRN:  [id number] Principal Diagnosis: Schizoaffective disorder, bipolar type [facility]) Diagnosis:   Patient Active Problem List   Diagnosis Date Noted  . Bipolar affective disorder, current episode manic ([place]) [F31.9] 12/23/2016    Total Time spent with patient: 30 minutes  Subjective:   Franklin Ayers is a 56 y.o. male patient admitted voluntarily for poor PO intake and refusing medications.    HPI:   According to medical records, patient lives at home with his mother. His mother reports that he has not been eating or drinking for 3 days. He also has not been taking his medications for 3 weeks. He has been more agitated and verbally aggressive to his mother. Yesterday he was nonverbal and only nodded to questions asked by his treatment team.   On interview, patient reports that he is hanging in there. He has been eating and drinking since he has been here. He reports that he was not eating anything at home because he could not keep food down. He also reports that his sleep has been poor. He exhibits paranoia and reports that things were not what they appeared to be at home. He reports seeing "bed bugs and insects." He also reports that he could not control himself in bed. He reports that he cannot go home today but is unable to explain why.    Past Psychiatric History: Schizoaffective disorder, bipolar type  Risk to Self: Suicidal Ideation: No Suicidal Intent: No Is patient at risk for suicide?: No Suicidal Plan?: No Access to Means: No What has been your use of drugs/alcohol within the last 12 months?: Current tobacco  How many times?: 0 Other Self Harm Risks: NA Triggers for Past Attempts: Unknown Intentional Self Injurious Behavior: None Risk to Others: Homicidal Ideation: No Thoughts of Harm to Others:  No Comment - Thoughts of Harm to Others: (None) Current Homicidal Intent: No Current Homicidal Plan: No Access to Homicidal Means: No Identified Victim: NA History of harm to others?: No Assessment of Violence: None Noted Violent Behavior Description: NA Does patient have access to weapons?: No Criminal Charges Pending?: No Does patient have a court date: No Prior Inpatient Therapy: Prior Inpatient Therapy: Yes Prior Therapy Dates: 2018 and many other admissions  Prior Therapy Facilty/Provider(s): [facility]  Reason for Treatment: Schizoaffective d/o Prior Outpatient Therapy: Prior Outpatient Therapy: Yes Prior Therapy Dates: Earlier in 2018(Not for the last 3 weeks per mother) Prior Therapy Facilty/Provider(s): [facility] Reason for Treatment: Med mang Does patient have an ACCT team?: No(Not currently) Does patient have Intensive In-House Services?  : No Does patient have [facility] services? : No Does patient have P4CC services?: No  Past Medical History:  Past Medical History:  Diagnosis Date  . Mental disorder   . Schizoaffective disorder [facility])     Past Surgical History:  Procedure Laterality Date  . TONSILLECTOMY     Family History:  Family History  Problem Relation Age of Onset  . Cancer Other   . Stroke Other    Family Psychiatric  History: Unknown  Social History:  Social History   Substance and Sexual Activity  Alcohol Use Yes   Comment: occasional     Social History   Substance and Sexual Activity  Drug Use No    Social History   Socioeconomic History  . Marital status: Single    Spouse name:  None  . Number of children: None  . Years of education: None  . Highest education level: None  Social Needs  . Financial resource strain: None  . Food insecurity - worry: None  . Food insecurity - inability: None  . Transportation needs - medical: None  . Transportation needs - non-medical: None  Occupational History  . None  Tobacco Use  .  Smoking status: Current Every Day Smoker    Packs/day: 2.00    Types: Cigarettes  . Smokeless tobacco: Never Used  . Tobacco comment: refused  Substance and Sexual Activity  . Alcohol use: Yes    Comment: occasional  . Drug use: No  . Sexual activity: No  Other Topics Concern  . None  Social History Narrative  . None   Additional Social History:    Allergies:   Allergies  Allergen Reactions  . Penicillins Shortness Of Breath    .Has patient had a PCN reaction causing immediate rash, facial/tongue/throat swelling, SOB or lightheadedness with hypotension: no Has patient had a PCN reaction causing severe rash involving mucus membranes or skin necrosis: no Has patient had a PCN reaction that required hospitalization: no Has patient had a PCN reaction occurring within the last 10 years: no If all of the above answers are "NO", then may proceed with Cephalosporin use.     Labs:  Results for orders placed or performed during the hospital encounter of 07/17/17 (from the past 48 hour(s))  CBC w/diff     Status: None   Collection Time: 07/17/17  3:36 PM  Result Value Ref Range   WBC 9.6 4.0 - 10.5 K/uL   RBC 4.74 4.22 - 5.81 MIL/uL   Hemoglobin 14.7 13.0 - 17.0 g/dL   HCT 43.2 39.0 - 52.0 %   MCV 91.1 78.0 - 100.0 fL   MCH 31.0 26.0 - 34.0 pg   MCHC 34.0 30.0 - 36.0 g/dL   RDW 13.2 11.5 - 15.5 %   Platelets 252 150 - 400 K/uL   Neutrophils Relative % 69 %   Neutro Abs 6.5 1.7 - 7.7 K/uL   Lymphocytes Relative 22 %   Lymphs Abs 2.1 0.7 - 4.0 K/uL   Monocytes Relative 6 %   Monocytes Absolute 0.6 0.1 - 1.0 K/uL   Eosinophils Relative 2 %   Eosinophils Absolute 0.2 0.0 - 0.7 K/uL   Basophils Relative 1 %   Basophils Absolute 0.1 0.0 - 0.1 K/uL  Comprehensive metabolic panel     Status: Abnormal   Collection Time: 07/17/17  3:36 PM  Result Value Ref Range   Sodium 137 135 - 145 mmol/L   Potassium 3.7 3.5 - 5.1 mmol/L   Chloride 102 101 - 111 mmol/L   CO2 25 22 - 32  mmol/L   Glucose, Bld 93 65 - 99 mg/dL   BUN 10 6 - 20 mg/dL   Creatinine, Ser 0.61 0.61 - 1.24 mg/dL   Calcium 9.3 8.9 - 10.3 mg/dL   Total Protein 7.3 6.5 - 8.1 g/dL   Albumin 4.1 3.5 - 5.0 g/dL   AST 17 15 - 41 U/L   ALT 17 17 - 63 U/L   Alkaline Phosphatase 98 38 - 126 U/L   Total Bilirubin 1.6 (H) 0.3 - 1.2 mg/dL   GFR calc non Af Amer >60 >60 mL/min   GFR calc Af Amer >60 >60 mL/min    Comment: (NOTE) The eGFR has been calculated using the CKD EPI equation. This calculation has  not been validated in all clinical situations. eGFR's persistently <60 mL/min signify possible Chronic Kidney Disease.    Anion gap 10 5 - 15  Ethanol     Status: None   Collection Time: 07/17/17  3:36 PM  Result Value Ref Range   Alcohol, Ethyl (B) <10 <10 mg/dL    Comment:        LOWEST DETECTABLE LIMIT FOR SERUM ALCOHOL IS 10 mg/dL FOR MEDICAL PURPOSES ONLY   Salicylate level     Status: None   Collection Time: 07/17/17  3:36 PM  Result Value Ref Range   Salicylate Lvl <2.3 2.8 - 30.0 mg/dL  Acetaminophen level     Status: Abnormal   Collection Time: 07/17/17  3:36 PM  Result Value Ref Range   Acetaminophen (Tylenol), Serum <10 (L) 10 - 30 ug/mL    Comment:        THERAPEUTIC CONCENTRATIONS VARY SIGNIFICANTLY. A RANGE OF 10-30 ug/mL MAY BE AN EFFECTIVE CONCENTRATION FOR MANY PATIENTS. HOWEVER, SOME ARE BEST TREATED AT CONCENTRATIONS OUTSIDE THIS RANGE. ACETAMINOPHEN CONCENTRATIONS >150 ug/mL AT 4 HOURS AFTER INGESTION AND >50 ug/mL AT 12 HOURS AFTER INGESTION ARE OFTEN ASSOCIATED WITH TOXIC REACTIONS.     Current Facility-Administered Medications  Medication Dose Route Frequency Provider Last Rate Last Dose  . acetaminophen (TYLENOL) tablet 650 mg  650 mg Oral Q4H PRN Street, [place], [place]      . alum & mag hydroxide-simeth (MAALOX/MYLANTA) 200-200-20 MG/5ML suspension 30 mL  30 mL Oral Q6H PRN Street, [place], [place]      . divalproex (DEPAKOTE ER) 24 hr tablet 1,000 mg   1,000 mg Oral QHS [address] [place], [place]   1,000 mg at 07/17/17 2207  . OLANZapine (ZYPREXA) tablet 5 mg  5 mg Oral QHS [address] [place], [place]   5 mg at 07/17/17 2208  . ondansetron (ZOFRAN) tablet 4 mg  4 mg Oral Q8H PRN Street, [place], [place]      . QUEtiapine (SEROQUEL) tablet 100 mg  100 mg Oral TID [address], [place], [place]   100 mg at 07/18/17 1057   Current Outpatient Medications  Medication Sig Dispense Refill  . divalproex (DEPAKOTE ER) 500 MG 24 hr tablet Take 2 tablets (1,000 mg total) by mouth at bedtime. (Patient not taking: Reported on 07/17/2017) 60 tablet 0  . OLANZapine (ZYPREXA) 5 MG tablet Take 1 tablet (5 mg total) by mouth at bedtime. (Patient not taking: Reported on 07/17/2017) 30 tablet 0  . QUEtiapine (SEROQUEL) 100 MG tablet Take 1 tablet (100 mg total) by mouth 3 (three) times daily. (Patient not taking: Reported on 07/17/2017) 90 tablet 0    Musculoskeletal: Strength & Muscle Tone: within normal limits Gait & Station: normal Patient leans: N/A  Psychiatric Specialty Exam: Physical Exam  Constitutional: He appears well-developed and well-nourished.  HENT:  Head: Normocephalic and atraumatic.  Neck: Normal range of motion.  Respiratory: Effort normal.  Musculoskeletal: Normal range of motion.  Neurological: He is alert.  Psychiatric: Thought content is paranoid. He expresses no homicidal and no suicidal ideation.    Review of Systems  Psychiatric/Behavioral: Positive for hallucinations. Negative for depression, substance abuse and suicidal ideas. The patient is nervous/anxious and has insomnia.     Blood pressure 112/64, pulse 62, temperature 98.5 F (36.9 C), temperature source Oral, resp. rate 18, SpO2 97 %.There is no height or weight on file to calculate BMI.  General Appearance: Well Groomed  Eye Contact:  Good  Speech:  Slow  Volume:  Decreased  Mood:  Anxious  Affect:  Constricted  Thought Process:  Linear but tangential when providing details.   Orientation:  Full (Time, Place, and Person)  Thought Content:  Perseverates about not being able to go home.  Suicidal Thoughts:  No  Homicidal Thoughts:  No  Memory:  Immediate;   Fair Recent;   Fair Remote;   Fair  Judgement:  Impaired  Insight:  Lacking  Psychomotor Activity:  Decreased  Concentration:  Concentration: Fair and Attention Span: Fair  Recall:  [employer] of Knowledge:  Poor  Language:  Fair  Akathisia:  No  Handed:  Right  AIMS (if indicated): N/A    Assets:  Housing Social Support  ADL's:  Intact  Cognition:  WNL  Sleep:   Okay   Assessment: Franklin Ayers is a 56 y/o male admitted voluntarily for poor PO intake and refusing medications at home. He exhibits paranoia thoughts and VH in the setting of medication noncompliance. He will be observed overnight with medication management to determine if he will be safe to discharge home or will need inpatient psychiatric admission.  He appears better today because he is speaking to the treatment team as he was nonverbal yesterday.   Treatment Plan Summary: -Continue home medications: Depakote 1000 mg qhs, Zyprexa 5 mg qhs and Seroquel 100 mg TID for schizoaffective disorder.  Daily contact with patient to assess and evaluate symptoms and progress in treatment  Disposition: observe overnight to determine if patient needs inpatient psychiatric admission.  Faythe Dingwall, DO 07/18/2017 11:20 AM

## 2017-07-18 NOTE — ED Notes (Signed)
Pt is very withdrawn.  He continues to not eat.  When asked why he cant eat he states "I tried".  Pt has been given soft food.  He is taking his medication and drinking water.  Pt denies S/I, H/I, and AVH.  Pt was encouraged to take in fluids and attempt to eat.

## 2017-07-18 NOTE — ED Notes (Signed)
Encouraged patient to try to eat dinner.  Patient got very agitated and told me he was sleeping.

## 2017-07-18 NOTE — ED Notes (Signed)
Pt sleeping at present, no distress noted, calm & cooperative.  Monitoring for safety, Q 15 min checks in effect. 

## 2017-07-18 NOTE — BH Specialist Note (Signed)
Dr Mariea Clonts & Romilda Garret NP recommend pt be monitored overnight. Pt will be evaluated for med management and seen in am by psychiatrist.

## 2017-07-19 DIAGNOSIS — F17210 Nicotine dependence, cigarettes, uncomplicated: Secondary | ICD-10-CM | POA: Diagnosis not present

## 2017-07-19 DIAGNOSIS — F22 Delusional disorders: Secondary | ICD-10-CM | POA: Diagnosis not present

## 2017-07-19 DIAGNOSIS — F250 Schizoaffective disorder, bipolar type: Secondary | ICD-10-CM | POA: Diagnosis not present

## 2017-07-19 DIAGNOSIS — G4700 Insomnia, unspecified: Secondary | ICD-10-CM | POA: Diagnosis not present

## 2017-07-19 DIAGNOSIS — R450 Nervousness: Secondary | ICD-10-CM | POA: Diagnosis not present

## 2017-07-19 DIAGNOSIS — F419 Anxiety disorder, unspecified: Secondary | ICD-10-CM | POA: Diagnosis not present

## 2017-07-19 MED ORDER — ENSURE ENLIVE PO LIQD
237.0000 mL | Freq: Two times a day (BID) | ORAL | Status: DC
  Administered 2017-07-19: 237 mL via ORAL
  Filled 2017-07-19: qty 237

## 2017-07-19 NOTE — BH Assessment (Signed)
[facility] Assessment Progress Note  Per Buford Dresser, DO, this pt does not require psychiatric hospitalization at this time.  Pt is to be discharged from [facility] with recommendation to continue treatment with the [facility] ACT Team.  This has been included in pt's discharge instructions.  Pt's nurse, Narda Rutherford, has been notified.  Jalene Mullet, [place] Triage Specialist [phone]

## 2017-07-19 NOTE — Care Management Note (Signed)
Case Management Note  CM was consulted for HHS for pt's transition back to home today.  CM attempted to speak with pt but he only stated he wanted security called and he was not going to be voluntary anymore.  CM attempted further conversation but did not make any progress.  CM spoke with Dr. Mariea Clonts and advised her pt was refusing services and conversation with CM.  Updated primary RN of pt's requests.  No further CM needs noted at this time.

## 2017-07-19 NOTE — Discharge Instructions (Signed)
For your behavioral health needs, you are advised to continue treatment with [facility]: ° °     [facility] °     [address] °     [place], [place] [address] °     [phone] °

## 2017-07-19 NOTE — [facility] Suicide Risk Assessment (Signed)
Suicide Risk Assessment  Discharge Assessment   [facility] Discharge Suicide Risk Assessment   Principal Problem: Schizoaffective disorder, bipolar type [facility]) Discharge Diagnoses:  Patient Active Problem List   Diagnosis Date Noted  . Schizoaffective disorder, bipolar type ([place]) [F25.0] 12/23/2016    Total Time spent with patient: 45 minutes  Musculoskeletal: Strength & Muscle Tone: within normal limits Gait & Station: normal Patient leans: N/A  Psychiatric Specialty Exam: Physical Exam  Constitutional: He is oriented to person, place, and time. He appears well-developed and well-nourished.  HENT:  Head: Normocephalic.  Respiratory: Effort normal.  Musculoskeletal: Normal range of motion.  Neurological: He is alert and oriented to person, place, and time.  Psychiatric: His speech is normal. He is slowed. Thought content is paranoid. Cognition and memory are impaired. He expresses impulsivity. He exhibits a depressed mood.   Review of Systems  Psychiatric/Behavioral: Positive for depression. Negative for hallucinations, memory loss, substance abuse and suicidal ideas. The patient is nervous/anxious. The patient does not have insomnia.   All other systems reviewed and are negative.  Blood pressure 108/71, pulse 87, temperature 98.6 F (37 C), resp. rate 16, SpO2 99 %.There is no height or weight on file to calculate BMI. General Appearance: Casual Eye Contact:  Good Speech:  Clear and Coherent Volume:  Decreased Mood:  Depressed Affect:  Congruent and Depressed Thought Process:  Coherent and Linear Orientation:  Full (Time, Place, and Person) Thought Content:  Illogical Suicidal Thoughts:  No Homicidal Thoughts:  No Memory:  Immediate;   Good Recent;   Fair Remote;   Fair Judgement:  Fair Insight:  Fair Psychomotor Activity:  Decreased Concentration:  Concentration: Good and Attention Span: Good Recall:  Good Fund of Knowledge:  Good Language:  Good Akathisia:   No Handed:  Right AIMS (if indicated):    Assets:  Agricultural consultant Housing Social Support ADL's:  Intact Cognition:  WNL   Mental Status Per Nursing Assessment::   On Admission:    non-compliant and not eating  Demographic Factors:  Male, Caucasian and Unemployed  Loss Factors: Decline in physical health  Historical Factors: Impulsivity  Risk Reduction Factors:   Living with another person, especially a relative and Positive social support  Continued Clinical Symptoms:  Bipolar Disorder:   Depressive phase Previous Psychiatric Diagnoses and Treatments  Cognitive Features That Contribute To Risk:  Closed-mindedness    Suicide Risk:  Minimal: No identifiable suicidal ideation.  Patients presenting with no risk factors but with morbid ruminations; may be classified as minimal risk based on the severity of the depressive symptoms    Plan Of Care/Follow-up recommendations:  Activity:  as tolerated Diet:  heart Healthy  Ethelene Hal, NP 07/19/2017, 12:40 PM

## 2017-07-19 NOTE — ED Notes (Signed)
Pt did not want to be discharged and he demanded to see security stating, "I am here voluntarily and I do not want to go home." He did not endorse SI/HI/AVH. He drank an Ensure given to him. At the time of discharge he was irritable and refused VS to be taken, but he did sign out and sign his belongings return. His mother and sister picked him up.

## 2017-07-19 NOTE — Consult Note (Addendum)
[place] [facility] Consult   Reason for Consult: Medication non-compliance  Referring Physician:  EDP Patient Identification: Franklin Ayers MRN:  [id number] Principal Diagnosis: Schizoaffective disorder, bipolar type [facility]) Diagnosis:   Patient Active Problem List   Diagnosis Date Noted  . Schizoaffective disorder, bipolar type ([place]) [F25.0] 12/23/2016    Total Time spent with patient: 45 minutes  Subjective:   Franklin Ayers is a 56 y.o. male patient admitted with poor PO intake and medication non-compliance.  HPI:  Pt was seen and chart reviewed with treatment team and Dr Mariea Clonts. Pt has been taking his medications while in the emergency room. Pt has been eating small amounts of soft foods and drinking liquids. Pt has to be encouraged to eat.  Pt denies suicidal/homicidal ideation, denies auditory/visual hallucinations and does not appear to be responding to internal stimuli. Pt resides with his mother. Pt is stable and psychiatrically clear for discharge.   Past Psychiatric History: As above  Risk to Self: None Risk to Others: None Prior Inpatient Therapy: Prior Inpatient Therapy: Yes Prior Therapy Dates: 2018 and many other admissions  Prior Therapy Facilty/Provider(s): [facility], [place]  Reason for Treatment: Schizoaffective d/o Prior Outpatient Therapy: Prior Outpatient Therapy: Yes Prior Therapy Dates: Earlier in 2018(Not for the last 3 weeks per mother) Prior Therapy Facilty/Provider(s): [facility] Reason for Treatment: Med mang Does patient have an ACCT team?: No(Not currently) Does patient have Intensive In-House Services?  : No Does patient have [facility] services? : No Does patient have P4CC services?: No  Past Medical History:  Past Medical History:  Diagnosis Date  . Mental disorder   . Schizoaffective disorder [facility])     Past Surgical History:  Procedure Laterality Date  . TONSILLECTOMY     Family History:  Family History  Problem Relation Age  of Onset  . Cancer Other   . Stroke Other    Family Psychiatric  History: Unknown Social History:  Social History   Substance and Sexual Activity  Alcohol Use Yes   Comment: occasional     Social History   Substance and Sexual Activity  Drug Use No    Social History   Socioeconomic History  . Marital status: Single    Spouse name: None  . Number of children: None  . Years of education: None  . Highest education level: None  Social Needs  . Financial resource strain: None  . Food insecurity - worry: None  . Food insecurity - inability: None  . Transportation needs - medical: None  . Transportation needs - non-medical: None  Occupational History  . None  Tobacco Use  . Smoking status: Current Every Day Smoker    Packs/day: 2.00    Types: Cigarettes  . Smokeless tobacco: Never Used  . Tobacco comment: refused  Substance and Sexual Activity  . Alcohol use: Yes    Comment: occasional  . Drug use: No  . Sexual activity: No  Other Topics Concern  . None  Social History Narrative  . None   Additional Social History: N/A    Allergies:   Allergies  Allergen Reactions  . Penicillins Shortness Of Breath    .Has patient had a PCN reaction causing immediate rash, facial/tongue/throat swelling, SOB or lightheadedness with hypotension: no Has patient had a PCN reaction causing severe rash involving mucus membranes or skin necrosis: no Has patient had a PCN reaction that required hospitalization: no Has patient had a PCN reaction occurring within the last 10 years: no If all of  the above answers are "NO", then may proceed with Cephalosporin use.     Labs:  Results for orders placed or performed during the hospital encounter of 07/17/17 (from the past 48 hour(s))  CBC w/diff     Status: None   Collection Time: 07/17/17  3:36 PM  Result Value Ref Range   WBC 9.6 4.0 - 10.5 K/uL   RBC 4.74 4.22 - 5.81 MIL/uL   Hemoglobin 14.7 13.0 - 17.0 g/dL   HCT 43.2 39.0 - 52.0  %   MCV 91.1 78.0 - 100.0 fL   MCH 31.0 26.0 - 34.0 pg   MCHC 34.0 30.0 - 36.0 g/dL   RDW 13.2 11.5 - 15.5 %   Platelets 252 150 - 400 K/uL   Neutrophils Relative % 69 %   Neutro Abs 6.5 1.7 - 7.7 K/uL   Lymphocytes Relative 22 %   Lymphs Abs 2.1 0.7 - 4.0 K/uL   Monocytes Relative 6 %   Monocytes Absolute 0.6 0.1 - 1.0 K/uL   Eosinophils Relative 2 %   Eosinophils Absolute 0.2 0.0 - 0.7 K/uL   Basophils Relative 1 %   Basophils Absolute 0.1 0.0 - 0.1 K/uL  Comprehensive metabolic panel     Status: Abnormal   Collection Time: 07/17/17  3:36 PM  Result Value Ref Range   Sodium 137 135 - 145 mmol/L   Potassium 3.7 3.5 - 5.1 mmol/L   Chloride 102 101 - 111 mmol/L   CO2 25 22 - 32 mmol/L   Glucose, Bld 93 65 - 99 mg/dL   BUN 10 6 - 20 mg/dL   Creatinine, Ser 0.61 0.61 - 1.24 mg/dL   Calcium 9.3 8.9 - 10.3 mg/dL   Total Protein 7.3 6.5 - 8.1 g/dL   Albumin 4.1 3.5 - 5.0 g/dL   AST 17 15 - 41 U/L   ALT 17 17 - 63 U/L   Alkaline Phosphatase 98 38 - 126 U/L   Total Bilirubin 1.6 (H) 0.3 - 1.2 mg/dL   GFR calc non Af Amer >60 >60 mL/min   GFR calc Af Amer >60 >60 mL/min    Comment: (NOTE) The eGFR has been calculated using the CKD EPI equation. This calculation has not been validated in all clinical situations. eGFR's persistently <60 mL/min signify possible Chronic Kidney Disease.    Anion gap 10 5 - 15  Ethanol     Status: None   Collection Time: 07/17/17  3:36 PM  Result Value Ref Range   Alcohol, Ethyl (B) <10 <10 mg/dL    Comment:        LOWEST DETECTABLE LIMIT FOR SERUM ALCOHOL IS 10 mg/dL FOR MEDICAL PURPOSES ONLY   Salicylate level     Status: None   Collection Time: 07/17/17  3:36 PM  Result Value Ref Range   Salicylate Lvl <1.4 2.8 - 30.0 mg/dL  Acetaminophen level     Status: Abnormal   Collection Time: 07/17/17  3:36 PM  Result Value Ref Range   Acetaminophen (Tylenol), Serum <10 (L) 10 - 30 ug/mL    Comment:        THERAPEUTIC CONCENTRATIONS  VARY SIGNIFICANTLY. A RANGE OF 10-30 ug/mL MAY BE AN EFFECTIVE CONCENTRATION FOR MANY PATIENTS. HOWEVER, SOME ARE BEST TREATED AT CONCENTRATIONS OUTSIDE THIS RANGE. ACETAMINOPHEN CONCENTRATIONS >150 ug/mL AT 4 HOURS AFTER INGESTION AND >50 ug/mL AT 12 HOURS AFTER INGESTION ARE OFTEN ASSOCIATED WITH TOXIC REACTIONS.     Current Facility-Administered Medications  Medication Dose Route Frequency  Provider Last Rate Last Dose  . acetaminophen (TYLENOL) tablet 650 mg  650 mg Oral Q4H PRN Street, [place], [place]      . alum & mag hydroxide-simeth (MAALOX/MYLANTA) 200-200-20 MG/5ML suspension 30 mL  30 mL Oral Q6H PRN Street, [place], [place]      . divalproex (DEPAKOTE ER) 24 hr tablet 1,000 mg  1,000 mg Oral QHS Street, [place], PA-C   1,000 mg at 07/18/17 2200  . feeding supplement (ENSURE ENLIVE) (ENSURE ENLIVE) liquid 237 mL  237 mL Oral BID BM Ethelene Hal, NP      . OLANZapine [facility]) tablet 5 mg  5 mg Oral [address] [place], [place]   5 mg at 07/18/17 2201  . ondansetron (ZOFRAN) tablet 4 mg  4 mg Oral Q8H PRN Street, [place], [place]      . QUEtiapine (SEROQUEL) tablet 100 mg  100 mg Oral TID [address], [place], [place]   100 mg at 07/19/17 1049   Current Outpatient Medications  Medication Sig Dispense Refill  . divalproex (DEPAKOTE ER) 500 MG 24 hr tablet Take 2 tablets (1,000 mg total) by mouth at bedtime. (Patient not taking: Reported on 07/17/2017) 60 tablet 0  . OLANZapine (ZYPREXA) 5 MG tablet Take 1 tablet (5 mg total) by mouth at bedtime. (Patient not taking: Reported on 07/17/2017) 30 tablet 0  . QUEtiapine (SEROQUEL) 100 MG tablet Take 1 tablet (100 mg total) by mouth 3 (three) times daily. (Patient not taking: Reported on 07/17/2017) 90 tablet 0    Musculoskeletal: Strength & Muscle Tone: within normal limits Gait & Station: normal Patient leans: N/A  Psychiatric Specialty Exam: Physical Exam  Constitutional: He is oriented to person, place, and time. He appears  well-developed and well-nourished.  HENT:  Head: Normocephalic.  Respiratory: Effort normal.  Musculoskeletal: Normal range of motion.  Neurological: He is alert and oriented to person, place, and time.  Psychiatric: His speech is normal. He is slowed. Thought content is paranoid. Cognition and memory are impaired. He expresses impulsivity. He exhibits a depressed mood.    Review of Systems  Psychiatric/Behavioral: Positive for depression. Negative for hallucinations, memory loss, substance abuse and suicidal ideas. The patient is nervous/anxious. The patient does not have insomnia.   All other systems reviewed and are negative.   Blood pressure 108/71, pulse 87, temperature 98.6 F (37 C), resp. rate 16, SpO2 99 %.There is no height or weight on file to calculate BMI.  General Appearance: Casual  Eye Contact:  Good  Speech:  Clear and Coherent  Volume:  Decreased  Mood:  Depressed  Affect:  Congruent and Depressed  Thought Process:  Coherent and Linear  Orientation:  Full (Time, Place, and Person)  Thought Content:  Illogical  Suicidal Thoughts:  No  Homicidal Thoughts:  No  Memory:  Immediate;   Good Recent;   Fair Remote;   Fair  Judgement:  Fair  Insight:  Fair  Psychomotor Activity:  Decreased  Concentration:  Concentration: Good and Attention Span: Good  Recall:  Good  Fund of Knowledge:  Good  Language:  Good  Akathisia:  No  Handed:  Right  AIMS (if indicated):   N/A  Assets:  Agricultural consultant Housing Social Support  ADL's:  Intact  Cognition:  WNL  Sleep:   Okay     Treatment Plan Summary: Plan Schizoaffective disorder, Bipolar type  Discharge Home Take all medications as prescribed Avoid the use of alcohol and illicit drugs Follow up with PCP for medical  concerns Follow up with [facility] for medication management. SW to provide family with information for home health services. Patient declined consult while  hospitalized.    Disposition: No evidence of imminent risk to self or others at present.   Patient does not meet criteria for psychiatric inpatient admission. Supportive therapy provided about ongoing stressors. Discussed crisis plan, support from social network, calling 911, coming to the Emergency Department, and calling Suicide Hotline.  Ethelene Hal, NP 07/19/2017 11:59 AM   Patient seen face-to-face for psychiatric evaluation, chart reviewed and case discussed with the physician extender and developed treatment plan. Reviewed the information documented and agree with the treatment plan.  Buford Dresser, DO

## 2017-07-24 ENCOUNTER — Other Ambulatory Visit: Payer: Self-pay

## 2017-07-24 ENCOUNTER — Emergency Department (HOSPITAL_COMMUNITY)
Admission: EM | Admit: 2017-07-24 | Discharge: 2017-07-26 | Disposition: A | Discharge status: Other Acute Inpatient Hospital | Payer: Medicare Other | Attending: Emergency Medicine | Admitting: Emergency Medicine

## 2017-07-24 ENCOUNTER — Ambulatory Visit (HOSPITAL_COMMUNITY)
Admission: RE | Admit: 2017-07-24 | Discharge: 2017-07-24 | Disposition: A | Discharge status: Home / Self Care | Payer: Medicare Other | Source: Home / Self Care | Attending: Psychiatry | Admitting: Psychiatry

## 2017-07-24 ENCOUNTER — Encounter (HOSPITAL_COMMUNITY): Payer: Self-pay

## 2017-07-24 DIAGNOSIS — F17210 Nicotine dependence, cigarettes, uncomplicated: Secondary | ICD-10-CM | POA: Insufficient documentation

## 2017-07-24 DIAGNOSIS — Z880 Allergy status to penicillin: Secondary | ICD-10-CM

## 2017-07-24 DIAGNOSIS — F39 Unspecified mood [affective] disorder: Secondary | ICD-10-CM | POA: Diagnosis not present

## 2017-07-24 DIAGNOSIS — F250 Schizoaffective disorder, bipolar type: Secondary | ICD-10-CM | POA: Diagnosis not present

## 2017-07-24 DIAGNOSIS — F259 Schizoaffective disorder, unspecified: Secondary | ICD-10-CM

## 2017-07-24 DIAGNOSIS — R638 Other symptoms and signs concerning food and fluid intake: Secondary | ICD-10-CM | POA: Insufficient documentation

## 2017-07-24 DIAGNOSIS — F1010 Alcohol abuse, uncomplicated: Secondary | ICD-10-CM | POA: Diagnosis not present

## 2017-07-24 DIAGNOSIS — R410 Disorientation, unspecified: Secondary | ICD-10-CM | POA: Diagnosis present

## 2017-07-24 LAB — BASIC METABOLIC PANEL
Anion gap: 9 (ref 5–15)
BUN: 18 mg/dL (ref 6–20)
CALCIUM: 9.8 mg/dL (ref 8.9–10.3)
CO2: 25 mmol/L (ref 22–32)
CREATININE: 0.63 mg/dL (ref 0.61–1.24)
Chloride: 105 mmol/L (ref 101–111)
GFR calc non Af Amer: >60 mL/min (ref >60)
Glucose, Bld: 118 mg/dL — ABNORMAL HIGH (ref 65–99)
Potassium: 3.6 mmol/L (ref 3.5–5.1)
SODIUM: 139 mmol/L (ref 135–145)

## 2017-07-24 LAB — CBC
HEMATOCRIT: 45.5 % (ref 39.0–52.0)
Hemoglobin: 15.4 g/dL (ref 13.0–17.0)
MCH: 31.1 pg (ref 26.0–34.0)
MCHC: 33.8 g/dL (ref 30.0–36.0)
MCV: 91.9 fL (ref 78.0–100.0)
PLATELETS: 254 10*3/uL (ref 150–400)
RBC: 4.95 MIL/uL (ref 4.22–5.81)
RDW: 13.1 % (ref 11.5–15.5)
WBC: 8.7 10*3/uL (ref 4.0–10.5)

## 2017-07-24 LAB — RAPID URINE DRUG SCREEN, HOSP PERFORMED
Amphetamines: NOT DETECTED
Barbiturates: NOT DETECTED
Benzodiazepines: NOT DETECTED
Cocaine: NOT DETECTED
Opiates: NOT DETECTED
TETRAHYDROCANNABINOL: NOT DETECTED

## 2017-07-24 LAB — ETHANOL: Alcohol, Ethyl (B): <10 mg/dL (ref <10)

## 2017-07-24 MED ORDER — DIVALPROEX SODIUM ER 500 MG PO TB24
1000.0000 mg | ORAL_TABLET | Freq: Every day | ORAL | Status: DC
  Administered 2017-07-24 – 2017-07-25 (×2): 1000 mg via ORAL
  Filled 2017-07-24 (×2): qty 2

## 2017-07-24 MED ORDER — ACETAMINOPHEN 325 MG PO TABS: 650.0000 mg | ORAL_TABLET | ORAL | Status: DC | PRN

## 2017-07-24 MED ORDER — OLANZAPINE 5 MG PO TABS
5.0000 mg | ORAL_TABLET | Freq: Every day | ORAL | Status: DC
  Administered 2017-07-24: 5 mg via ORAL
  Filled 2017-07-24: qty 1

## 2017-07-24 MED ORDER — ALUM & MAG HYDROXIDE-SIMETH 200-200-20 MG/5ML PO SUSP: 30.0000 mL | Freq: Four times a day (QID) | ORAL | Status: DC | PRN

## 2017-07-24 MED ORDER — ONDANSETRON HCL 4 MG PO TABS: 4.0000 mg | ORAL_TABLET | Freq: Three times a day (TID) | ORAL | Status: DC | PRN

## 2017-07-24 MED ORDER — QUETIAPINE FUMARATE 100 MG PO TABS
100.0000 mg | ORAL_TABLET | Freq: Three times a day (TID) | ORAL | Status: DC
  Administered 2017-07-24 (×2): 100 mg via ORAL
  Filled 2017-07-24 (×3): qty 1

## 2017-07-24 NOTE — ED Notes (Signed)
PT IN BATHROOM CHANGING INTO SCRUBS. AWARE OF NEED FOR URINE SAMPLE

## 2017-07-24 NOTE — ED Notes (Signed)
Pt is aware a urine sample is needed but is unable to provide one at this time. Pt has urine cup at bedside.

## 2017-07-24 NOTE — ED Notes (Signed)
Pt presents for medical clearance, denies SI, HI or AVH.  Pt refusing to eat, tray offered.  A&O x 3, no distress noted, calm & cooperative.  Monitoring for safety, Q 15 min checks in effect.

## 2017-07-24 NOTE — BH Assessment (Signed)
Assessment Note  Franklin Ayers is an 56 y.o. male presents to [facility] with after his mother called EMS and the police this morning to the home. Mother reports the police convinced the patient to come to [facility] for an assessment. Mother drove him here. The patient lives with his mother, Franklin Ayers [phone]. The patient has been seen for multiple visits in the ER over the last several months, returns home and presents again with similar symptoms. The patient is described with vegetative symptoms, not getting out of bed, not dressing, not eating or drinking. The patient reportedly had liquids yesterday but mother reports very little food or drink since he was last discharged 07/19/17. States she believes he has given up and wants to die. The patient denies any active SI, HI or A/V. When asked about reasons for not eating or taking his medication he appears unorganized, confused, unable to communicate reason for refusing to food or drink or medication. Originally told this clinician that Franklin Ayers was not his mother, that she was a caregiver and lives with him.   The patient was diagnosed with mental health issues years ago. Mother reports the patient was attending college at the time. He has been on injectable medication in the past and lived for 10 yrs at a group home. After breaking the curfew rules and leaving the home for 2 days he lost his place at the home. Since this time he has lived with mother who is elderly. She states he receives outpatient services at [facility] with Dr. Filomena Jungling. States this provider has seen the patient for a long time but he is not helpful in providing mother with supportive resources in the area or obtaining a case worker. Mother states Dr. Filomena Jungling mentioned IM monthly medications to the patient at his last visit but patient refused. He refuses oral medications at home as well. The patient is not on the [facility] team.   ED reports suggest the patient has a history of using  cocaine. Denies any SA use currently except tobacco. The patient appeared disheveled, had an odor, admits to vegetative symptoms, had pressured speech, has anxious mood and blunted affect, had difficulty tracking in a conversation, decreased concentration. Mother and sister are his primary support system. The patient has been inpatient multiple times in the past. Patient admitted to [facility] in the past.   Elmarie Shiley NP recommends inpatient for stabilization once medically cleared. TTS to look for placement.   Diagnosis: Schizoaffective disorder   Past Medical History:  Past Medical History:  Diagnosis Date  . Mental disorder   . Schizoaffective disorder [facility])     Past Surgical History:  Procedure Laterality Date  . TONSILLECTOMY      Family History:  Family History  Problem Relation Age of Onset  . Cancer Other   . Stroke Other     Social History:  reports that he has been smoking cigarettes.  He has been smoking about 2.00 packs per day. he has never used smokeless tobacco. He reports that he drinks alcohol. He reports that he does not use drugs.  Additional Social History:  Alcohol / Drug Use Pain Medications: see MAR Prescriptions: see MAR Over the Counter: see MAR History of alcohol / drug use?: Yes Substance #1 Name of Substance 1: possible cocaine use in the past 1 - Age of First Use: UTA 1 - Amount (size/oz): UTA 1 - Frequency: UTA 1 - Duration: UTA 1 - Last Use / Amount: indicated as reason for  admission to ER in previous report  CIWA: CIWA-Ar BP: 133/66 Pulse Rate: 71 COWS:    Allergies:  Allergies  Allergen Reactions  . Penicillins Shortness Of Breath    .Has patient had a PCN reaction causing immediate rash, facial/tongue/throat swelling, SOB or lightheadedness with hypotension: no Has patient had a PCN reaction causing severe rash involving mucus membranes or skin necrosis: no Has patient had a PCN reaction that required hospitalization: no Has patient had  a PCN reaction occurring within the last 10 years: no If all of the above answers are "NO", then may proceed with Cephalosporin use.     Home Medications:  (Not in a hospital admission)  OB/GYN Status:  No LMP for male patient.  General Assessment Data Location of Assessment: [facility] TTS Assessment: In system Is this a Tele or Face-to-Face Assessment?: Face-to-Face Is this an Initial Assessment or a Re-assessment for this encounter?: Initial Assessment Marital status: Single Maiden name: n/a Is patient pregnant?: No Pregnancy Status: No Living Arrangements: Parent Can pt return to current living arrangement?: Yes Admission Status: Voluntary Is patient capable of signing voluntary admission?: Yes Referral Source: Self/Family/Friend Insurance type: MCR/MCD  Medical Screening Exam ([place]) Medical Exam completed: Yes  Crisis Care Plan Living Arrangements: Parent Legal Guardian: (n/a) Name of Psychiatrist: Dr. Filomena Jungling, [facility] Name of Therapist: n/a  Education Status Is patient currently in school?: No Current Grade: (n/a) Highest grade of school patient has completed: Some college Name of school: (n/a) Contact person: (n/a)  Risk to self with the past 6 months Suicidal Ideation: Yes-Currently Present(passive SI, not eating or drinking, vegetative symptoms) Has patient been a risk to self within the past 6 months prior to admission? : No Suicidal Intent: No Has patient had any suicidal intent within the past 6 months prior to admission? : No Is patient at risk for suicide?: Yes Suicidal Plan?: No Has patient had any suicidal plan within the past 6 months prior to admission? : No Access to Means: No What has been your use of drugs/alcohol within the last 12 months?: uses tobacco Previous Attempts/Gestures: No How many times?: 0 Other Self Harm Risks: n/a Triggers for Past Attempts: None known Intentional Self Injurious Behavior:  None Family Suicide History: No Recent stressful life event(s): Other (Comment) Persecutory voices/beliefs?: No Depression: No Depression Symptoms: (n/a) Substance abuse history and/or treatment for substance abuse?: No Suicide prevention information given to non-admitted patients: Not applicable  Risk to Others within the past 6 months Homicidal Ideation: No Does patient have any lifetime risk of violence toward others beyond the six months prior to admission? : Yes (comment)(report previous aggression with family) Thoughts of Harm to Others: No Current Homicidal Intent: No Current Homicidal Plan: No Access to Homicidal Means: No Identified Victim: n/a History of harm to others?: No Assessment of Violence: None Noted Violent Behavior Description: n/a Does patient have access to weapons?: No Criminal Charges Pending?: No Does patient have a court date: No Is patient on probation?: No  Psychosis Hallucinations: None noted Delusions: None noted  Mental Status Report Appearance/Hygiene: Body odor, Disheveled, Poor hygiene Eye Contact: Fair Motor Activity: Freedom of movement Speech: Pressured Level of Consciousness: Alert Mood: Anxious Affect: Anxious, Blunted Anxiety Level: Moderate Thought Processes: Irrelevant, Tangential(confusion) Judgement: Partial Orientation: Person, Place, Time, Situation Obsessive Compulsive Thoughts/Behaviors: Unable to Assess  Cognitive Functioning Concentration: Decreased Memory: Unable to Assess IQ: Average Insight: Poor Impulse Control: Poor Appetite: Poor Weight Loss: 0 Weight Gain: 0 Sleep:  Unable to Assess Total Hours of Sleep: (n/a) Vegetative Symptoms: Staying in bed, Not bathing, Decreased grooming  ADLScreening [facility] Assessment Services) Patient's cognitive ability adequate to safely complete daily activities?: Yes Patient able to express need for assistance with ADLs?: Yes Independently performs ADLs?: Yes (appropriate for  developmental age)  Prior Inpatient Therapy Prior Inpatient Therapy: Yes Prior Therapy Dates: 2018, and previous admissions Prior Therapy Facilty/Provider(s): [facility], [facility] Reason for Treatment: Schizoaffective  Prior Outpatient Therapy Prior Outpatient Therapy: No Does patient have an ACCT team?: No Does patient have Intensive In-House Services?  : No Does patient have [facility] services? : No Does patient have P4CC services?: No  ADL Screening (condition at time of admission) Patient's cognitive ability adequate to safely complete daily activities?: Yes Is the patient deaf or have difficulty hearing?: No Does the patient have difficulty seeing, even when wearing glasses/contacts?: No Does the patient have difficulty concentrating, remembering, or making decisions?: No Patient able to express need for assistance with ADLs?: Yes Does the patient have difficulty dressing or bathing?: No Independently performs ADLs?: Yes (appropriate for developmental age)       Abuse/Neglect Assessment (Assessment to be complete while patient is alone) Physical Abuse: Denies Verbal Abuse: Denies Sexual Abuse: Denies     Regulatory affairs officer (For Healthcare) Does Patient Have a Medical Advance Directive?: No Would patient like information on creating a medical advance directive?: No - Patient declined    Additional Information 1:1 In Past 12 Months?: No CIRT Risk: No Elopement Risk: No Does patient have medical clearance?: No     Disposition:  Disposition Initial Assessment Completed for this Encounter: Yes Disposition of Patient: Other dispositions Type of inpatient treatment program: Adult Other disposition(s): Other (Comment)  On Site Evaluation by:   Reviewed with Physician:  Elmarie Shiley NP  [place] 07/24/2017 2:40 PM

## 2017-07-24 NOTE — ED Triage Notes (Signed)
EDP Campos present during my triage. Pt states went to [facility] and was told to come here for "blood test" . Denies SI and HI

## 2017-07-24 NOTE — ED Provider Notes (Signed)
[place] DEPT Provider Note   CSN: [id number] Arrival date & time: 07/24/17  1507     History   Chief Complaint No chief complaint on file.   HPI Franklin Ayers is a 56 y.o. male.  HPI Sent from [facility] for medical clearance. He reports he has been eating poorly. No HI or SI. Hx of mental health illness.   From Elmarie Shiley NP at [facility] "The patient has a long history of mental illness dating back to his teen years. Will send patient back to [facility] for medical clearance and recommend inpatient psychiatric admission."  Calm and cooperative at this time    Past Medical History:  Diagnosis Date  . Mental disorder   . Schizoaffective disorder [facility])     Patient Active Problem List   Diagnosis Date Noted  . Schizoaffective disorder, bipolar type ([place]) 12/23/2016    Past Surgical History:  Procedure Laterality Date  . TONSILLECTOMY         Home Medications    Prior to Admission medications   Medication Sig Start Date End Date Taking? Authorizing Provider  divalproex (DEPAKOTE ER) 500 MG 24 hr tablet Take 2 tablets (1,000 mg total) by mouth at bedtime. Patient not taking: Reported on 07/17/2017 06/19/17   Patrecia Pour, NP  OLANZapine (ZYPREXA) 5 MG tablet Take 1 tablet (5 mg total) by mouth at bedtime. Patient not taking: Reported on 07/17/2017 06/19/17   Patrecia Pour, NP  QUEtiapine (SEROQUEL) 100 MG tablet Take 1 tablet (100 mg total) by mouth 3 (three) times daily. Patient not taking: Reported on 07/17/2017 06/19/17   Patrecia Pour, NP    Family History Family History  Problem Relation Age of Onset  . Cancer Other   . Stroke Other     Social History Social History   Tobacco Use  . Smoking status: Current Every Day Smoker    Packs/day: 2.00    Types: Cigarettes  . Smokeless tobacco: Never Used  . Tobacco comment: refused  Substance Use Topics  . Alcohol use: Yes    Comment: occasional  . Drug use: No      Allergies   Penicillins   Review of Systems Review of Systems  Respiratory: Negative for shortness of breath.   Cardiovascular: Negative for chest pain.  Gastrointestinal: Negative for abdominal pain.  Skin: Negative for rash.  Neurological: Negative for weakness.  Psychiatric/Behavioral: Positive for confusion and decreased concentration. Negative for agitation and self-injury.  All other systems reviewed and are negative.    Physical Exam Updated Vital Signs BP (!) 144/98 (BP Location: Right Arm)   Pulse 63   Temp 98 F (36.7 C) (Oral)   Resp 18   SpO2 99%   Physical Exam  Constitutional: He is oriented to person, place, and time. He appears well-developed and well-nourished.  HENT:  Head: Normocephalic.  Eyes: EOM are normal.  Neck: Normal range of motion.  Pulmonary/Chest: Effort normal.  Abdominal: He exhibits no distension.  Musculoskeletal: Normal range of motion.  Neurological: He is alert and oriented to person, place, and time.  Psychiatric: He has a normal mood and affect.  Nursing note and vitals reviewed.    ED Treatments / Results  Labs (all labs ordered are listed, but only abnormal results are displayed) Labs Reviewed  CBC  BASIC METABOLIC PANEL  ETHANOL  RAPID URINE DRUG SCREEN, HOSP PERFORMED    EKG  EKG Interpretation None       Radiology No results  found.  Procedures Procedures (including critical care time)  Medications Ordered in ED Medications  acetaminophen (TYLENOL) tablet 650 mg (not administered)  alum & mag hydroxide-simeth (MAALOX/MYLANTA) 200-200-20 MG/5ML suspension 30 mL (not administered)  ondansetron (ZOFRAN) tablet 4 mg (not administered)  divalproex (DEPAKOTE ER) 24 hr tablet 1,000 mg (not administered)  OLANZapine (ZYPREXA) tablet 5 mg (not administered)  QUEtiapine (SEROQUEL) tablet 100 mg (not administered)     Initial Impression / Assessment and Plan / ED Course  I have reviewed the triage vital  signs and the nursing notes.  Pertinent labs & imaging results that were available during my care of the patient were reviewed by me and considered in my medical decision making (see chart for details).     Calm and cooperative. Medically clear. Labs ordered.   Final Clinical Impressions(s) / ED Diagnoses   Final diagnoses:  None    ED Discharge Orders    None       Jola Schmidt, MD 07/24/17 [phone]

## 2017-07-24 NOTE — H&P (Signed)
Behavioral Health Medical Screening Exam  Franklin Ayers is an 56 y.o. male presents as a walk in after being brought by his mother. Patient appears confused during the assessment stating "I could not help my mother anymore. I was in [place] when I was 17. I have forgotten how to take my medications." He was recently discharged at [facility] 07/19/2017 and was documented to be irritable during discharge process. His mother reported to counselor that patient has not been eating or drinking well since his discharge. Upon review of lab-work his potassium level was at the low end of normal of 3.7 as of 07/17/17. Patient appears to have severe depressive symptoms with decline in ability to compete activities of daily living. He does not appear to have showered over the last few days. The patient has a long history of mental illness dating back to his teen years. Will send patient back to [facility] for medical clearance and recommend inpatient psychiatric admission.   Total Time spent with patient: 20 minutes  Psychiatric Specialty Exam: Physical Exam  Constitutional: He appears well-developed and well-nourished.  HENT:  Head: Normocephalic and atraumatic.  Neck: Normal range of motion.  Cardiovascular: Normal rate, regular rhythm, normal heart sounds and intact distal pulses.    Review of Systems  Psychiatric/Behavioral: Positive for depression, memory loss and suicidal ideas (Can be described as passive due to refusal to eat or drink. ).    Blood pressure 133/66, pulse 71, temperature 98.8 F (37.1 C), resp. rate 18, SpO2 99 %.There is no height or weight on file to calculate BMI.  General Appearance: Disheveled  Eye Contact:  Fair  Speech:  Clear and Coherent  Volume:  Normal  Mood:  Anxious and Depressed  Affect:  Congruent  Thought Process:  Irrelevant  Orientation:  Full (Time, Place, and Person)  Thought Content:  Illogical  Suicidal Thoughts:  No  Homicidal Thoughts:  No  Memory:  Immediate;    Fair Recent;   Poor Remote;   Poor  Judgement:  Poor  Insight:  Lacking  Psychomotor Activity:  Restlessness  Concentration: Concentration: Fair and Attention Span: Fair  Recall:  Poor  Fund of Knowledge:Poor  Language: Fair  Akathisia:  No  Handed:  Right  AIMS (if indicated):     Assets:  Agricultural consultant Housing Intimacy Leisure Time Physical Health Resilience Social Support  Sleep:       Musculoskeletal: Strength & Muscle Tone: within normal limits Gait & Station: normal Patient leans: N/A  Blood pressure 133/66, pulse 71, temperature 98.8 F (37.1 C), resp. rate 18, SpO2 99 %.  Recommendations:  Based on my evaluation the patient does not appear to have an emergency medical condition.  Elmarie Shiley, NP 07/24/2017, 2:33 PM

## 2017-07-25 DIAGNOSIS — F17210 Nicotine dependence, cigarettes, uncomplicated: Secondary | ICD-10-CM

## 2017-07-25 DIAGNOSIS — F39 Unspecified mood [affective] disorder: Secondary | ICD-10-CM

## 2017-07-25 DIAGNOSIS — F250 Schizoaffective disorder, bipolar type: Secondary | ICD-10-CM

## 2017-07-25 DIAGNOSIS — F1010 Alcohol abuse, uncomplicated: Secondary | ICD-10-CM | POA: Diagnosis not present

## 2017-07-25 MED ORDER — LORAZEPAM 1 MG PO TABS
1.0000 mg | ORAL_TABLET | Freq: Two times a day (BID) | ORAL | Status: DC
  Administered 2017-07-25 – 2017-07-26 (×3): 1 mg via ORAL
  Filled 2017-07-25 (×4): qty 1

## 2017-07-25 MED ORDER — QUETIAPINE FUMARATE 300 MG PO TABS: 300.0000 mg | ORAL_TABLET | Freq: Every day | ORAL | Status: DC

## 2017-07-25 NOTE — ED Notes (Signed)
RN and this Probation officer approached patient in room to prompt patient to eat and drink.  While speaking with the patient, patient became tearful. When asked if patient was upset or needed anything, patient stated "No."

## 2017-07-25 NOTE — ED Notes (Signed)
This Probation officer offered patient apple sauce and to warm up his breakfast for him. Patient did not respond other than blinking and breathing. Patient respirations are normal and unlabored.

## 2017-07-25 NOTE — ED Notes (Signed)
Patient asked this Probation officer for items to take a shower. Hygiene items, towels and a new set of scrubs were given to patient. Patient then went into bathroom and took a shower.

## 2017-07-25 NOTE — ED Notes (Addendum)
Patient was observed to have eaten more than half his dinner tray. Patient appears to be in good spirits as he is smiling, pleasantly speaking with this Probation officer and engages in conversation and eye contact. Patients affect observed to be different than earlier this morning.

## 2017-07-25 NOTE — ED Notes (Signed)
Pt refusing to interact with this nurse. When this nurse engages in conversation with the pt, pt blinks eyes and does not verbally communicate. Respirations equal, breathing non labored. Pt encouraged PO fluid intake. Laurie,NP made aware of pt behavior. Special checks q 15 mins in place for safety, Video monitoring in place. Will continue to monitor.

## 2017-07-25 NOTE — Consult Note (Signed)
[facility] Face-to-Face Psychiatry Consult    Patient Identification: Franklin Ayers MRN:  [id number] Principal Diagnosis: Schizoaffective disorder, bipolar type [facility]) Diagnosis:   Patient Active Problem List   Diagnosis Date Noted  . Schizoaffective disorder, bipolar type ([place]) [F25.0] 12/23/2016    Total Time spent with patient: 45 minutes  Subjective:   Franklin Ayers is a 56 y.o. male patient admitted to the ER by his mother after she notes that her son is unwilling to eat or drink at home. Mother notes that he refuses to eat for the last couple of days and she is concerned.  HPI:   Patient presents to ER following mothers concern that patient is unwilling to eat at home. Patient has a history of Schizoaffective disorder and has been taking Seroquel 3 times a day. During morning rounding patient was visibly withdrawn and looked nervous. Patient was selectively muted and did not wish to communicate except by intermittently shaking his head. Patient mother denies patient eating, drinking, getting dressed, or even getting out of bed. Mother is unsure of why patient patient has had these recent behavior changes. Patient is again unwilling to answer any questions.  Past Psychiatric History: Schizoaffective disorder  Risk to Self: Is patient at risk for suicide?: No Risk to Others:   Prior Inpatient Therapy:   Prior Outpatient Therapy:    Past Medical History:  Past Medical History:  Diagnosis Date  . Mental disorder   . Schizoaffective disorder [facility])     Past Surgical History:  Procedure Laterality Date  . TONSILLECTOMY     Family History:  Family History  Problem Relation Age of Onset  . Cancer Other   . Stroke Other    Family Psychiatric  History: Patient declines to answer  Social History:  Social History   Substance and Sexual Activity  Alcohol Use Yes   Comment: occasional     Social History   Substance and Sexual Activity  Drug Use No    Social History    Socioeconomic History  . Marital status: Single    Spouse name: None  . Number of children: None  . Years of education: None  . Highest education level: None  Social Needs  . Financial resource strain: None  . Food insecurity - worry: None  . Food insecurity - inability: None  . Transportation needs - medical: None  . Transportation needs - non-medical: None  Occupational History  . None  Tobacco Use  . Smoking status: Current Every Day Smoker    Packs/day: 2.00    Types: Cigarettes  . Smokeless tobacco: Never Used  . Tobacco comment: refused  Substance and Sexual Activity  . Alcohol use: Yes    Comment: occasional  . Drug use: No  . Sexual activity: No  Other Topics Concern  . None  Social History Narrative  . None   Additional Social History:    Allergies:   Allergies  Allergen Reactions  . Penicillins Shortness Of Breath    .Has patient had a PCN reaction causing immediate rash, facial/tongue/throat swelling, SOB or lightheadedness with hypotension: no Has patient had a PCN reaction causing severe rash involving mucus membranes or skin necrosis: no Has patient had a PCN reaction that required hospitalization: no Has patient had a PCN reaction occurring within the last 10 years: no If all of the above answers are "NO", then may proceed with Cephalosporin use.     Labs:  Results for orders placed or performed during the hospital encounter of  07/24/17 (from the past 48 hour(s))  CBC     Status: None   Collection Time: 07/24/17  4:39 PM  Result Value Ref Range   WBC 8.7 4.0 - 10.5 K/uL   RBC 4.95 4.22 - 5.81 MIL/uL   Hemoglobin 15.4 13.0 - 17.0 g/dL   HCT 45.5 39.0 - 52.0 %   MCV 91.9 78.0 - 100.0 fL   MCH 31.1 26.0 - 34.0 pg   MCHC 33.8 30.0 - 36.0 g/dL   RDW 13.1 11.5 - 15.5 %   Platelets 254 150 - 400 K/uL  Basic metabolic panel     Status: Abnormal   Collection Time: 07/24/17  4:39 PM  Result Value Ref Range   Sodium 139 135 - 145 mmol/L    Potassium 3.6 3.5 - 5.1 mmol/L   Chloride 105 101 - 111 mmol/L   CO2 25 22 - 32 mmol/L   Glucose, Bld 118 (H) 65 - 99 mg/dL   BUN 18 6 - 20 mg/dL   Creatinine, Ser 0.63 0.61 - 1.24 mg/dL   Calcium 9.8 8.9 - 10.3 mg/dL   GFR calc non Af Amer >60 >60 mL/min   GFR calc Af Amer >60 >60 mL/min    Comment: (NOTE) The eGFR has been calculated using the CKD EPI equation. This calculation has not been validated in all clinical situations. eGFR's persistently <60 mL/min signify possible Chronic Kidney Disease.    Anion gap 9 5 - 15  Ethanol     Status: None   Collection Time: 07/24/17  4:39 PM  Result Value Ref Range   Alcohol, Ethyl (B) <10 <10 mg/dL    Comment:        LOWEST DETECTABLE LIMIT FOR SERUM ALCOHOL IS 10 mg/dL FOR MEDICAL PURPOSES ONLY   Rapid urine drug screen (hospital performed)     Status: None   Collection Time: 07/24/17  8:59 PM  Result Value Ref Range   Opiates NONE DETECTED NONE DETECTED   Cocaine NONE DETECTED NONE DETECTED   Benzodiazepines NONE DETECTED NONE DETECTED   Amphetamines NONE DETECTED NONE DETECTED   Tetrahydrocannabinol NONE DETECTED NONE DETECTED   Barbiturates NONE DETECTED NONE DETECTED    Comment:        DRUG SCREEN FOR MEDICAL PURPOSES ONLY.  IF CONFIRMATION IS NEEDED FOR ANY PURPOSE, NOTIFY LAB WITHIN 5 DAYS.        LOWEST DETECTABLE LIMITS FOR URINE DRUG SCREEN Drug Class       Cutoff (ng/mL) Amphetamine      1000 Barbiturate      200 Benzodiazepine   242 Tricyclics       353 Opiates          300 Cocaine          300 THC              50     Current Facility-Administered Medications  Medication Dose Route Frequency Provider Last Rate Last Dose  . acetaminophen (TYLENOL) tablet 650 mg  650 mg Oral Q4H PRN Jola Schmidt, MD      . alum & mag hydroxide-simeth (MAALOX/MYLANTA) 200-200-20 MG/5ML suspension 30 mL  30 mL Oral Q6H PRN Jola Schmidt, MD      . divalproex (DEPAKOTE ER) 24 hr tablet 1,000 mg  1,000 mg Oral Dolly Rias, MD   1,000 mg at 07/24/17 2112  . LORazepam (ATIVAN) tablet 1 mg  1 mg Oral BID Corena Pilgrim, MD   1 mg at 07/25/17  1302  . ondansetron (ZOFRAN) tablet 4 mg  4 mg Oral Q8H PRN Jola Schmidt, MD      . Derrill Memo ON 07/26/2017] QUEtiapine (SEROQUEL) tablet 300 mg  300 mg Oral QHS Corena Pilgrim, MD       Current Outpatient Medications  Medication Sig Dispense Refill  . divalproex (DEPAKOTE ER) 500 MG 24 hr tablet Take 2 tablets (1,000 mg total) by mouth at bedtime. (Patient not taking: Reported on 07/17/2017) 60 tablet 0  . OLANZapine (ZYPREXA) 5 MG tablet Take 1 tablet (5 mg total) by mouth at bedtime. (Patient not taking: Reported on 07/17/2017) 30 tablet 0  . QUEtiapine (SEROQUEL) 100 MG tablet Take 1 tablet (100 mg total) by mouth 3 (three) times daily. (Patient not taking: Reported on 07/17/2017) 90 tablet 0     Psychiatric Specialty Exam: Physical Exam  Constitutional: He appears well-developed and well-nourished.  Psychiatric: His mood appears not anxious. His affect is blunt and labile. His affect is not angry and not inappropriate. He is withdrawn. He is not agitated, not aggressive, not hyperactive and not combative. He does not exhibit a depressed mood.    Review of Systems  Psychiatric/Behavioral: Positive for depression. Negative for hallucinations, memory loss, substance abuse and suicidal ideas. The patient is not nervous/anxious and does not have insomnia.     Blood pressure 123/79, pulse 72, temperature 98.9 F (37.2 C), temperature source Oral, resp. rate 18, SpO2 99 %.There is no height or weight on file to calculate BMI.  General Appearance: Disheveled  Eye Contact:  Fair  Speech:  Blocked  Volume:  Slective mutism  Mood:  Depressed  Affect:  Blunt and Labile  Thought Process:  NA  Orientation:  Other:  unable to assess  Thought Content:  unable to assess  Suicidal Thoughts:  unable to assess  Homicidal Thoughts:  unable to assess  Memory:  NA  Judgement:   NA  Insight:  NA  Psychomotor Activity:  Normal          Akathisia:  No    AIMS (if indicated):     Assets:  Financial Resources/Insurance Social Support      Sleep:        Treatment Plan Summary: Daily contact with patient to assess and evaluate symptoms and progress in treatment and Medication management  -Crisis stabilization Continue these medications: -Lorazepam 1 mg BID -Depakote ER 24 hr tablet 1,000 mg QHS for mood stabilization -Seroquel 300 mg QHS for mood stabilization   Disposition: Recommend psychiatric Inpatient admission when medically cleared. TTS to seek placement  Ethelene Hal, NP 07/25/2017 2:06 PM  Patient seen face-to-face for psychiatric evaluation, chart reviewed and case discussed with the physician extender and developed treatment plan. Reviewed the information documented and agree with the treatment plan. Corena Pilgrim, MD

## 2017-07-25 NOTE — ED Notes (Signed)
Pt drank two cartons of milk and ate some of sandwich from lunch.

## 2017-07-25 NOTE — ED Notes (Signed)
This writer was able to prompt patient to drink some water. Patient refused to eat or drink anything else.

## 2017-07-25 NOTE — ED Notes (Signed)
Pt compliant with PO medication regimen with much encouragement. Pt has minimal interaction with this nurse, but does answer questions appropriately. Pt offered and drank water. Special checks q 15 mins in place for safety, Video monitoring in place. Will continue to monitor.

## 2017-07-25 NOTE — Progress Notes (Signed)
07/25/17  1415:  LRT introduced self to pt and offered activities.  Pt stated he was "going to bathe later".  Pt declined.   Victorino Sparrow, LRT/CTRS

## 2017-07-25 NOTE — ED Notes (Signed)
Pt drank milk, ate apple sauce, and grapes. Pt asking for more milk, more milk provided. Pt affect more bright, communicating appropriately with this nurse.

## 2017-07-25 NOTE — ED Notes (Signed)
Patient currently denies SI/HI/AVH at this time. Plan of care discussed with patient. Encouragement and support provided and safety maintain. Q 15 min safety checks remain in place and video monitoring.

## 2017-07-26 ENCOUNTER — Encounter (HOSPITAL_COMMUNITY): Payer: Self-pay | Admitting: *Deleted

## 2017-07-26 ENCOUNTER — Inpatient Hospital Stay (HOSPITAL_COMMUNITY)
Admission: AD | Admit: 2017-07-26 | Discharge: 2017-08-03 | DRG: 885 | Disposition: A | Discharge status: Home / Self Care | Payer: Medicare Other | Source: Intra-hospital | Attending: Psychiatry | Admitting: Psychiatry

## 2017-07-26 ENCOUNTER — Other Ambulatory Visit: Payer: Self-pay

## 2017-07-26 DIAGNOSIS — F419 Anxiety disorder, unspecified: Secondary | ICD-10-CM | POA: Diagnosis not present

## 2017-07-26 DIAGNOSIS — Z9114 Patient's other noncompliance with medication regimen: Secondary | ICD-10-CM | POA: Diagnosis not present

## 2017-07-26 DIAGNOSIS — R450 Nervousness: Secondary | ICD-10-CM | POA: Diagnosis not present

## 2017-07-26 DIAGNOSIS — F17210 Nicotine dependence, cigarettes, uncomplicated: Secondary | ICD-10-CM | POA: Diagnosis present

## 2017-07-26 DIAGNOSIS — F39 Unspecified mood [affective] disorder: Secondary | ICD-10-CM | POA: Diagnosis not present

## 2017-07-26 DIAGNOSIS — F250 Schizoaffective disorder, bipolar type: Principal | ICD-10-CM | POA: Diagnosis present

## 2017-07-26 DIAGNOSIS — F1010 Alcohol abuse, uncomplicated: Secondary | ICD-10-CM | POA: Diagnosis not present

## 2017-07-26 DIAGNOSIS — Z880 Allergy status to penicillin: Secondary | ICD-10-CM | POA: Diagnosis not present

## 2017-07-26 DIAGNOSIS — R5381 Other malaise: Secondary | ICD-10-CM | POA: Diagnosis not present

## 2017-07-26 DIAGNOSIS — F259 Schizoaffective disorder, unspecified: Secondary | ICD-10-CM | POA: Diagnosis present

## 2017-07-26 DIAGNOSIS — R5383 Other fatigue: Secondary | ICD-10-CM | POA: Diagnosis not present

## 2017-07-26 DIAGNOSIS — F411 Generalized anxiety disorder: Secondary | ICD-10-CM | POA: Diagnosis present

## 2017-07-26 DIAGNOSIS — F29 Unspecified psychosis not due to a substance or known physiological condition: Secondary | ICD-10-CM | POA: Diagnosis not present

## 2017-07-26 MED ORDER — ONDANSETRON HCL 4 MG PO TABS: 4.0000 mg | ORAL_TABLET | Freq: Three times a day (TID) | ORAL | Status: DC | PRN

## 2017-07-26 MED ORDER — QUETIAPINE FUMARATE 300 MG PO TABS
300.0000 mg | ORAL_TABLET | Freq: Every day | ORAL | Status: DC
  Administered 2017-07-26 – 2017-08-02 (×6): 300 mg via ORAL
  Filled 2017-07-26 (×11): qty 1

## 2017-07-26 MED ORDER — DIVALPROEX SODIUM ER 500 MG PO TB24
1000.0000 mg | ORAL_TABLET | Freq: Every day | ORAL | Status: DC
  Administered 2017-07-26 – 2017-08-02 (×6): 1000 mg via ORAL
  Filled 2017-07-26 (×11): qty 2

## 2017-07-26 MED ORDER — LORAZEPAM 1 MG PO TABS
1.0000 mg | ORAL_TABLET | Freq: Two times a day (BID) | ORAL | Status: DC
  Administered 2017-07-26 – 2017-07-30 (×7): 1 mg via ORAL
  Filled 2017-07-26 (×8): qty 1

## 2017-07-26 MED ORDER — ALUM & MAG HYDROXIDE-SIMETH 200-200-20 MG/5ML PO SUSP: 30.0000 mL | ORAL | Status: DC | PRN

## 2017-07-26 MED ORDER — MAGNESIUM HYDROXIDE 400 MG/5ML PO SUSP: 30.0000 mL | Freq: Every day | ORAL | Status: DC | PRN

## 2017-07-26 MED ORDER — ACETAMINOPHEN 325 MG PO TABS
650.0000 mg | ORAL_TABLET | Freq: Four times a day (QID) | ORAL | Status: DC | PRN
Start: 2017-07-26 — End: 2017-08-03

## 2017-07-26 NOTE — Progress Notes (Signed)
07/26/17 1402:  LRT introduced self to pt and offered activities, pt declined.   Victorino Sparrow, LRT/CTRS

## 2017-07-26 NOTE — Progress Notes (Signed)
Nursing Progress Note: 7p-7a D: Pt currently presents with a anxious/disorganized/animated affect and behavior. Pt states "I need my seroquel in 100mg  pills. I want take take them over the whole night. I also I need to remember this song about cigarettes. from the 52's, you remember?" Interacting appropriately with the milieu. Pt reports good sleep during the previous night with current medication regimen. Pt did attend wrap-up group.  A: Pt provided with medications per providers orders. Pt's labs and vitals were monitored throughout the night. Pt supported emotionally and encouraged to express concerns and questions. Pt educated on medications.  R: Pt's safety ensured with 15 minute and environmental checks. Pt currently denies SI, HI, and AVH. Pt verbally contracts to seek staff if SI,HI, or AVH occurs and to consult with staff before acting on any harmful thoughts. Will continue to monitor.

## 2017-07-26 NOTE — Progress Notes (Signed)
Patient ID: Franklin Ayers, male   DOB: Sep 04, 1961, 56 y.o.   MRN: [id number] Per State regulations 482.30 this chart was reviewed for medical necessity with respect to the patient's admission/duration of stay.    Next review date: 07/28/17  Debarah Crape, BSN, RN-BC  Case Manager

## 2017-07-26 NOTE — BH Assessment (Signed)
[place] Assessment Progress Note  Per Corena Pilgrim, MD, this pt requires psychiatric hospitalization.  Leonia Reader, RN, [facility] has assigned pt to [facility] Rm 500-1.  Dr Darleene Cleaver also finds that pt meets criteria for IVC, which he has initiated.  IVC documents have been faxed to [facility], and at 12:51 Lattie Corns confirms receipt.  Findings and Custody Order has since been served, and  IVC documents have been faxed to [facility].  Pt's nurse, Caryl Pina, has been notified, and agrees to call report to [phone].  Pt is to be transported via Event organiser.   Jalene Mullet, [place] Triage Specialist [phone]

## 2017-07-26 NOTE — Progress Notes (Signed)
Franklin Ayers is a 56 year old male pt admitted on involuntary basis. On admission, pt does report that he was feeling depressed and tired but denied any SI and spoke about how he is feeling better now. He reports that he is taking medications as prescribed, denies any SIHI and is able to contract for safety while in the hospital. He spoke about how he does not sleep well and then he will want to get a cigarette and then how it is even harder to get to sleep if he smokes a cigarette and spoke about wanting to get some sleep while he is here. He reports that he lives with his mother and reports that he will be able to go back there at discharge. Franklin Ayers was oriented to the unit and safety maintained.

## 2017-07-26 NOTE — Social Work (Signed)
Referred to [facility], is [place] resident.  Rico Massar, LCSW Lead Clinical Social Worker Phone:  [phone]  

## 2017-07-26 NOTE — BHH Counselor (Addendum)
Pt has been accepted to [facility], assigned to room/bed: 500-1, after 0830. Attending physicians: Dr. Parke Poisson, Jinny Blossom, NP. Nursing report: [phone]. Support paperwork needs to be completed. Updated disposition discussed with [place], RN.     Vertell Novak, MS, [facility], [facility] Triage Specialist [phone]

## 2017-07-26 NOTE — Tx Team (Signed)
Initial Treatment Plan 07/26/2017 6:23 PM Liberty Handy [id number]    PATIENT STRESSORS: Marital or family conflict Medication change or noncompliance   PATIENT STRENGTHS: Ability for insight Average or above average intelligence Communication skills General fund of knowledge   PATIENT IDENTIFIED PROBLEMS: Depression Suicidal thoughts "Sleep habits, I have a hard time getting to sleep"                     DISCHARGE CRITERIA:  Ability to meet basic life and health needs Improved stabilization in mood, thinking, and/or behavior Verbal commitment to aftercare and medication compliance  PRELIMINARY DISCHARGE PLAN: Attend aftercare/continuing care group Return to previous living arrangement  PATIENT/FAMILY INVOLVEMENT: This treatment plan has been presented to and reviewed with the patient, Franklin Ayers, and/or family member, .  The patient and family have been given the opportunity to ask questions and make suggestions.  [place], [place], [place] 07/26/2017, 6:23 PM

## 2017-07-26 NOTE — Progress Notes (Signed)
Adult Psychoeducational Group Note  Date:  07/26/2017 Time:  9:52 PM  Group Topic/Focus:  Wrap-Up Group:   The focus of this group is to help patients review their daily goal of treatment and discuss progress on daily workbooks.  Participation Level:  Minimal  Participation Quality:  Appropriate  Affect:  Blunted  Cognitive:  Oriented  Insight: Limited  Engagement in Group:  Engaged  Modes of Intervention:  Socialization and Support  Additional Comments:  Patient attended an participated in group tonight. He reports having a good day. Today was his first day. He has adjusted to the environment and meeting his peers.  Salley Scarlet [facility] 07/26/2017, 9:52 PM

## 2017-07-26 NOTE — Consult Note (Signed)
[place] Psychiatry Consult   Reason for Consult:  Refusing PO intake  Referring Physician:  EDP Patient Identification: Franklin Ayers MRN:  [id number] Principal Diagnosis: Schizoaffective disorder, bipolar type [facility]) Diagnosis:   Patient Active Problem List   Diagnosis Date Noted  . Schizoaffective disorder, bipolar type ([place]) [F25.0] 12/23/2016    Total Time spent with patient: 30 minutes  Subjective:   Franklin Ayers is a 56 y.o. male patient admitted with paranoia, not eating or drinking, talking or getting out of bed.  HPI:  Pt was seen and chart reviewed with treatment team and Dr Darleene Cleaver. Pt is much better today after medication adjustments made yesterday. Per Pt's mother, Pt has been refusing food and liquids and not taking care of himself at home. Pt was very guarded and non-communicative upon admission. Pt is up, eating, talking, and showered. Pt would benefit from an inpatient psychiatric admission.   Past Psychiatric History: As above  Risk to Self: Is patient at risk for suicide?: No Risk to Others:   Prior Inpatient Therapy:   Prior Outpatient Therapy:    Past Medical History:  Past Medical History:  Diagnosis Date  . Mental disorder   . Schizoaffective disorder [facility])     Past Surgical History:  Procedure Laterality Date  . TONSILLECTOMY     Family History:  Family History  Problem Relation Age of Onset  . Cancer Other   . Stroke Other    Family Psychiatric  History: Unknown Social History:  Social History   Substance and Sexual Activity  Alcohol Use Yes   Comment: occasional     Social History   Substance and Sexual Activity  Drug Use No    Social History   Socioeconomic History  . Marital status: Single    Spouse name: None  . Number of children: None  . Years of education: None  . Highest education level: None  Social Needs  . Financial resource strain: None  . Food insecurity - worry: None  . Food insecurity -  inability: None  . Transportation needs - medical: None  . Transportation needs - non-medical: None  Occupational History  . None  Tobacco Use  . Smoking status: Current Every Day Smoker    Packs/day: 2.00    Types: Cigarettes  . Smokeless tobacco: Never Used  . Tobacco comment: refused  Substance and Sexual Activity  . Alcohol use: Yes    Comment: occasional  . Drug use: No  . Sexual activity: No  Other Topics Concern  . None  Social History Narrative  . None   Additional Social History:    Allergies:   Allergies  Allergen Reactions  . Penicillins Shortness Of Breath    .Has patient had a PCN reaction causing immediate rash, facial/tongue/throat swelling, SOB or lightheadedness with hypotension: no Has patient had a PCN reaction causing severe rash involving mucus membranes or skin necrosis: no Has patient had a PCN reaction that required hospitalization: no Has patient had a PCN reaction occurring within the last 10 years: no If all of the above answers are "NO", then may proceed with Cephalosporin use.     Labs:  Results for orders placed or performed during the hospital encounter of 07/24/17 (from the past 48 hour(s))  CBC     Status: None   Collection Time: 07/24/17  4:39 PM  Result Value Ref Range   WBC 8.7 4.0 - 10.5 K/uL   RBC 4.95 4.22 - 5.81 MIL/uL   Hemoglobin 15.4  13.0 - 17.0 g/dL   HCT 45.5 39.0 - 52.0 %   MCV 91.9 78.0 - 100.0 fL   MCH 31.1 26.0 - 34.0 pg   MCHC 33.8 30.0 - 36.0 g/dL   RDW 13.1 11.5 - 15.5 %   Platelets 254 150 - 400 K/uL  Basic metabolic panel     Status: Abnormal   Collection Time: 07/24/17  4:39 PM  Result Value Ref Range   Sodium 139 135 - 145 mmol/L   Potassium 3.6 3.5 - 5.1 mmol/L   Chloride 105 101 - 111 mmol/L   CO2 25 22 - 32 mmol/L   Glucose, Bld 118 (H) 65 - 99 mg/dL   BUN 18 6 - 20 mg/dL   Creatinine, Ser 0.63 0.61 - 1.24 mg/dL   Calcium 9.8 8.9 - 10.3 mg/dL   GFR calc non Af Amer >60 >60 mL/min   GFR calc Af  Amer >60 >60 mL/min    Comment: (NOTE) The eGFR has been calculated using the CKD EPI equation. This calculation has not been validated in all clinical situations. eGFR's persistently <60 mL/min signify possible Chronic Kidney Disease.    Anion gap 9 5 - 15  Ethanol     Status: None   Collection Time: 07/24/17  4:39 PM  Result Value Ref Range   Alcohol, Ethyl (B) <10 <10 mg/dL    Comment:        LOWEST DETECTABLE LIMIT FOR SERUM ALCOHOL IS 10 mg/dL FOR MEDICAL PURPOSES ONLY   Rapid urine drug screen (hospital performed)     Status: None   Collection Time: 07/24/17  8:59 PM  Result Value Ref Range   Opiates NONE DETECTED NONE DETECTED   Cocaine NONE DETECTED NONE DETECTED   Benzodiazepines NONE DETECTED NONE DETECTED   Amphetamines NONE DETECTED NONE DETECTED   Tetrahydrocannabinol NONE DETECTED NONE DETECTED   Barbiturates NONE DETECTED NONE DETECTED    Comment:        DRUG SCREEN FOR MEDICAL PURPOSES ONLY.  IF CONFIRMATION IS NEEDED FOR ANY PURPOSE, NOTIFY LAB WITHIN 5 DAYS.        LOWEST DETECTABLE LIMITS FOR URINE DRUG SCREEN Drug Class       Cutoff (ng/mL) Amphetamine      1000 Barbiturate      200 Benzodiazepine   673 Tricyclics       419 Opiates          300 Cocaine          300 THC              50     Current Facility-Administered Medications  Medication Dose Route Frequency Provider Last Rate Last Dose  . acetaminophen (TYLENOL) tablet 650 mg  650 mg Oral Q4H PRN Jola Schmidt, MD      . alum & mag hydroxide-simeth (MAALOX/MYLANTA) 200-200-20 MG/5ML suspension 30 mL  30 mL Oral Q6H PRN Jola Schmidt, MD      . divalproex (DEPAKOTE ER) 24 hr tablet 1,000 mg  1,000 mg Oral Dolly Rias, MD   1,000 mg at 07/25/17 2153  . LORazepam (ATIVAN) tablet 1 mg  1 mg Oral BID Corena Pilgrim, MD   1 mg at 07/26/17 0932  . ondansetron (ZOFRAN) tablet 4 mg  4 mg Oral Q8H PRN Jola Schmidt, MD      . QUEtiapine (SEROQUEL) tablet 300 mg  300 mg Oral QHS Corena Pilgrim, MD       Current Outpatient Medications  Medication  Sig Dispense Refill  . divalproex (DEPAKOTE ER) 500 MG 24 hr tablet Take 2 tablets (1,000 mg total) by mouth at bedtime. (Patient not taking: Reported on 07/17/2017) 60 tablet 0  . OLANZapine (ZYPREXA) 5 MG tablet Take 1 tablet (5 mg total) by mouth at bedtime. (Patient not taking: Reported on 07/17/2017) 30 tablet 0  . QUEtiapine (SEROQUEL) 100 MG tablet Take 1 tablet (100 mg total) by mouth 3 (three) times daily. (Patient not taking: Reported on 07/17/2017) 90 tablet 0    Musculoskeletal: Strength & Muscle Tone: within normal limits Gait & Station: normal Patient leans: N/A  Psychiatric Specialty Exam: Physical Exam  Constitutional: He appears well-developed and well-nourished.  HENT:  Head: Normocephalic.  Respiratory: Effort normal.  Musculoskeletal: Normal range of motion.  Neurological: He is alert.  Psychiatric: His speech is normal and behavior is normal. Thought content is delusional. Cognition and memory are impaired. He expresses impulsivity. He exhibits a depressed mood.    Review of Systems  Psychiatric/Behavioral: Positive for depression. Negative for hallucinations, memory loss, substance abuse and suicidal ideas. The patient is nervous/anxious. The patient does not have insomnia.   All other systems reviewed and are negative.   Blood pressure 116/66, pulse 67, temperature 98.2 F (36.8 C), resp. rate 18, SpO2 98 %.There is no height or weight on file to calculate BMI.  General Appearance: Casual  Eye Contact:  Good  Speech:  Clear and Coherent  Volume:  Normal  Mood:  Anxious and Depressed  Affect:  Congruent and Depressed  Thought Process:  Coherent  Orientation:  Full (Time, Place, and Person)  Thought Content:  Logical  Suicidal Thoughts:  No  Homicidal Thoughts:  No  Memory:  Immediate;   Good Recent;   Fair Remote;   Fair  Judgement:  Fair  Insight:  Fair  Psychomotor Activity:  Normal   Concentration:  Concentration: Good and Attention Span: Good  Recall:  Good  Fund of Knowledge:  Fair  Language:  Good  Akathisia:  No  Handed:  Right  AIMS (if indicated):     Assets:  Communication Skills Physical Health Social Support  ADL's:  Intact  Cognition:  WNL  Sleep:        Treatment Plan Summary: Daily contact with patient to assess and evaluate symptoms and progress in treatment and Medication management (see MAR)  Disposition: Recommend psychiatric Inpatient admission when medically cleared.  Ethelene Hal, NP 07/26/2017 12:39 PM  Patient seen face-to-face for psychiatric evaluation, chart reviewed and case discussed with the physician extender and developed treatment plan. Reviewed the information documented and agree with the treatment plan. Corena Pilgrim, MD

## 2017-07-26 NOTE — ED Notes (Signed)
[facility] on unit to transfer pt to [facility] Adult unit per MD order. Personal property given to [employer] for transfer. Pt ambulatory off unit in police custody.  

## 2017-07-27 DIAGNOSIS — F419 Anxiety disorder, unspecified: Secondary | ICD-10-CM

## 2017-07-27 DIAGNOSIS — F17210 Nicotine dependence, cigarettes, uncomplicated: Secondary | ICD-10-CM

## 2017-07-27 DIAGNOSIS — F250 Schizoaffective disorder, bipolar type: Principal | ICD-10-CM

## 2017-07-27 LAB — LIPID PANEL
CHOL/HDL RATIO: 2.6 ratio
CHOLESTEROL: 105 mg/dL (ref 0–200)
HDL: 40 mg/dL — ABNORMAL LOW (ref >40)
LDL Cholesterol: 45 mg/dL (ref 0–99)
Triglycerides: 99 mg/dL (ref <150)
VLDL: 20 mg/dL (ref 0–40)

## 2017-07-27 LAB — TSH: TSH: 1.213 u[IU]/mL (ref 0.350–4.500)

## 2017-07-27 LAB — HEMOGLOBIN A1C
Hgb A1c MFr Bld: 5.6 % (ref 4.8–5.6)
MEAN PLASMA GLUCOSE: 114.02 mg/dL

## 2017-07-27 MED ORDER — TUBERCULIN PPD 5 UNIT/0.1ML ID SOLN
5.0000 [IU] | Freq: Once | INTRADERMAL | Status: AC
  Administered 2017-07-29: 5 [IU] via INTRADERMAL

## 2017-07-27 NOTE — Progress Notes (Signed)
Did not attend group 

## 2017-07-27 NOTE — Tx Team (Signed)
Interdisciplinary Treatment and Diagnostic Plan Update  07/27/2017 Time of Session: 10:19 AM  Franklin Ayers MRN: [id number]  Principal Diagnosis: Schizoaffective disorder, bipolar type  Secondary Diagnoses: Active Problems:   Schizoaffective disorder, bipolar type (HCC)   Current Medications:  Current Facility-Administered Medications  Medication Dose Route Frequency Provider Last Rate Last Dose  . acetaminophen (TYLENOL) tablet 650 mg  650 mg Oral Q6H PRN Ethelene Hal, NP      . alum & mag hydroxide-simeth (MAALOX/MYLANTA) 200-200-20 MG/5ML suspension 30 mL  30 mL Oral Q4H PRN Ethelene Hal, NP      . divalproex (DEPAKOTE ER) 24 hr tablet 1,000 mg  1,000 mg Oral QHS Ethelene Hal, NP   1,000 mg at 07/26/17 2125  . LORazepam (ATIVAN) tablet 1 mg  1 mg Oral BID Ethelene Hal, NP   Stopped at 07/27/17 0900  . magnesium hydroxide (MILK OF MAGNESIA) suspension 30 mL  30 mL Oral Daily PRN Ethelene Hal, NP      . ondansetron [facility]) tablet 4 mg  4 mg Oral Q8H PRN Ethelene Hal, NP      . QUEtiapine (SEROQUEL) tablet 300 mg  300 mg Oral QHS Ethelene Hal, NP   300 mg at 07/26/17 2125    PTA Medications: Medications Prior to Admission  Medication Sig Dispense Refill Last Dose  . divalproex (DEPAKOTE ER) 500 MG 24 hr tablet Take 2 tablets (1,000 mg total) by mouth at bedtime. (Patient not taking: Reported on 07/17/2017) 60 tablet 0 Not Taking at Unknown time  . OLANZapine (ZYPREXA) 5 MG tablet Take 1 tablet (5 mg total) by mouth at bedtime. (Patient not taking: Reported on 07/17/2017) 30 tablet 0 Not Taking at Unknown time  . QUEtiapine (SEROQUEL) 100 MG tablet Take 1 tablet (100 mg total) by mouth 3 (three) times daily. (Patient not taking: Reported on 07/17/2017) 90 tablet 0 Not Taking at Unknown time    Treatment Modalities: Medication Management, Group therapy, Case management,  1 to 1 session with clinician, Psychoeducation,  Recreational therapy.  Patient Stressors: Marital or family conflict Medication change or noncompliance  Patient Strengths: Ability for insight Average or above average intelligence Curator fund of knowledge   Physician Treatment Plan for Primary Diagnosis: Schizoaffective disorder, bipolar type Long Term Goal(s): Improvement in symptoms so as ready for discharge  Short Term Goals:    Medication Management: Evaluate patient's response, side effects, and tolerance of medication regimen.  Therapeutic Interventions: 1 to 1 sessions, Unit Group sessions and Medication administration.  Evaluation of Outcomes: Progressing  Physician Treatment Plan for Secondary Diagnosis: Active Problems:   Schizoaffective disorder, bipolar type ([place])  Long Term Goal(s): Improvement in symptoms so as ready for discharge  Short Term Goals:    Medication Management: Evaluate patient's response, side effects, and tolerance of medication regimen.  Therapeutic Interventions: 1 to 1 sessions, Unit Group sessions and Medication administration.  Evaluation of Outcomes: Progressing   RN Treatment Plan for Primary Diagnosis: Schizoaffective disorder, bipolar type Long Term Goal(s): Knowledge of disease and therapeutic regimen to maintain health will improve  Short Term Goals: Ability to remain free from injury will improve, Ability to participate in decision making will improve, Ability to verbalize feelings will improve, Ability to disclose and discuss suicidal ideas, Ability to identify and develop effective coping behaviors will improve and Compliance with prescribed medications will improve  Medication Management: RN will administer medications as ordered by provider, will assess and evaluate patient's response  and provide education to patient for prescribed medication. RN will report any adverse and/or side effects to prescribing provider.  Therapeutic Interventions: 1 on 1  counseling sessions, Psychoeducation, Medication administration, Evaluate responses to treatment, Monitor vital signs and CBGs as ordered, Perform/monitor CIWA, COWS, AIMS and Fall Risk screenings as ordered, Perform wound care treatments as ordered.  Evaluation of Outcomes: Progressing   LCSW Treatment Plan for Primary Diagnosis: Schizoaffective disorder, bipolar type Long Term Goal(s): Safe transition to appropriate next level of care at discharge, Engage patient in therapeutic group addressing interpersonal concerns.  Short Term Goals: Engage patient in aftercare planning with referrals and resources, Increase emotional regulation, Facilitate acceptance of mental health diagnosis and concerns, Identify triggers associated with mental health/substance abuse issues and Increase skills for wellness and recovery  Therapeutic Interventions: Assess for all discharge needs, 1 to 1 time with Social worker, Explore available resources and support systems, Assess for adequacy in community support network, Educate family and significant other(s) on suicide prevention, Complete Psychosocial Assessment, Interpersonal group therapy.  Evaluation of Outcomes: Progressing   Progress in Treatment: Attending groups: No Participating in groups: No Taking medication as prescribed: Yes Toleration of medication: Yes, no side effects reported at this time Family/Significant other contact made: Yes, Franklin Ayers [phone] Patient understands diagnosis: No, limited insight  Discussing patient identified problems/goals with staff: Yes Medical problems stabilized or resolved: Yes Denies suicidal/homicidal ideation: Yes Issues/concerns per patient self-inventory: None Other: N/A  New problem(s) identified: None identified at this time.   New Short Term/Long Term Goal(s): "To get back on medication and into an assisted living facility"   Discharge Plan or Barriers: Upon discharge his mother would like  him to go to an assisted living facility   Reason for Continuation of Hospitalization: Depression Hallucinations Medication stabilization Suicidal ideation   Estimated Length of Stay: 08/01/17  Attendees: Patient: Franklin Ayers 07/27/2017  10:19 AM  Physician: Maris Berger, MD 07/27/2017  10:19 AM  Nursing: Jonette Mate, RN 07/27/2017  10:19 AM  RN Care Manager: Lars Pinks, RN 07/27/2017  10:19 AM  Social Worker: Ripley Fraise, LCSW; Verdis Frederickson, Social Work Intern 07/27/2017  10:19 AM  Recreational Therapist: Victorino Sparrow, LRT 07/27/2017  10:19 AM  Other: Norberto Sorenson, [place] 07/27/2017  10:19 AM  Other:  07/27/2017  10:19 AM  Other: 07/27/2017  10:19 AM    Scribe for Treatment Team: Darleen Crocker, Student-Social Work 07/27/2017 10:19 AM

## 2017-07-27 NOTE — Progress Notes (Signed)
D: Patient denies SI, HI or AVH. Patient remains in bed, irritable on approach with minimal interaction.  Pt. Has not been up to the dayroom or attended groups today.   A: Patient given emotional support from RN. Patient encouraged to come to staff with concerns and/or questions. Patient's medication routine continued. Patient's orders and plan of care reviewed.   R: Patient remains appropriate and cooperative. Will continue to monitor patient q15 minutes for safety.

## 2017-07-27 NOTE — Progress Notes (Signed)
Nursing Progress Note: 7p-7a D: Pt currently presents with a irritable/unwilling to participate/disorganized/concrete affect and behavior. Pt states affirmatives only. Interacting appropriate with the milieu. Pt reports did sleep during the previous night with current medication regimen. Pt did attend wrap-up group.  A: Pt provided with medications per providers orders. Pt's labs and vitals were monitored throughout the night. Pt supported emotionally and encouraged to express concerns and questions. Pt educated on medications.  R: Pt's safety ensured with 15 minute and environmental checks. Pt currently denies SI, HI, and AVH. Pt verbally contracts to seek staff if SI,HI, or AVH occurs and to consult with staff before acting on any harmful thoughts. Will continue to monitor.

## 2017-07-27 NOTE — Progress Notes (Signed)
Recreation Therapy Notes  Date:  07/27/17  Time: 1000 Location: 500 Hall Dayroom  Group Topic: Stress Management  Goal Area(s) Addresses:  Patient will verbalize importance of using healthy stress management.  Patient will identify positive emotions associated with healthy stress management.   Intervention: Stress Management  Activity :  Stress Management.  LRT introduced 3 stress management techniques (deep breathing, progressive muscle relaxation and guided imagery).  LRT walked patients through each technique to help them learn and understand the ways each technique can help reduce stress.  Education:  Stress Management, Discharge Planning.   Education Outcome: Acknowledges edcuation/In group clarification offered/Needs additional education  Clinical Observations/Feedback: Pt did not attend group.    Victorino Sparrow, LRT/CTRS         Victorino Sparrow A 07/27/2017 12:39 PM

## 2017-07-27 NOTE — [facility] Group Notes (Signed)
LCSW Group Therapy Note   07/27/2017 1:15pm   Type of Therapy and Topic:  Group Therapy:  Positive Affirmations   Participation Level:  Did Not Attend  Description of Group: This group addressed positive affirmation toward self and others. Patients went around the room and identified two positive things about themselves and two positive things about a peer in the room. Patients reflected on how it felt to share something positive with others, to identify positive things about themselves, and to hear positive things from others. Patients were encouraged to have a daily reflection of positive characteristics or circumstances.  Therapeutic Goals 1. Patient will verbalize two of their positive qualities 2. Patient will demonstrate empathy for others by stating two positive qualities about a peer in the group 3. Patient will verbalize their feelings when voicing positive self affirmations and when voicing positive affirmations of others 4. Patients will discuss the potential positive impact on their wellness/recovery of focusing on positive traits of self and others. Summary of Patient Progress:    Therapeutic Modalities Cognitive Behavioral Therapy Motivational Interviewing  Riley Lam Work 07/27/2017 12:45 PM

## 2017-07-27 NOTE — [facility] Suicide Risk Assessment (Signed)
[facility] Admission Suicide Risk Assessment   Nursing information obtained from:    Demographic factors:    Current Mental Status:    Loss Factors:    Historical Factors:    Risk Reduction Factors:     Total Time spent with patient: 1 hour Principal Problem: Schizoaffective disorder, bipolar type ([place]) Diagnosis:   Patient Active Problem List   Diagnosis Date Noted  . Schizoaffective disorder, bipolar type ([place]) [F25.0] 12/23/2016   Subjective Data:  See H&P for full HPI Uziah Sorter is 56 y/o M with history of schizoaffective disorder who was admitted with worsening symptoms of psychosis including not eating, not drinking, not talking, not taking medications, and not getting out of bed. He was restarted on home medications of depakote and seroquel in the ED and he was transferred to [facility] for additional treatment and stabilization. Pt has been eating, drinking, taking medications, bathing, and participation in unit expectations since arrival. We will continue to monitor his symptoms on the unit and adjust his treatment plan accordingly.   Continued Clinical Symptoms:  Alcohol Use Disorder Identification Test Final Score (AUDIT): 1 The "Alcohol Use Disorders Identification Test", Guidelines for Use in Primary Care, Second Edition.  World Pharmacologist [facility]). Score between 0-7:  no or low risk or alcohol related problems. Score between 8-15:  moderate risk of alcohol related problems. Score between 16-19:  high risk of alcohol related problems. Score 20 or above:  warrants further diagnostic evaluation for alcohol dependence and treatment.   CLINICAL FACTORS:   Bipolar Disorder:   Depressive phase Schizophrenia:   Paranoid or undifferentiated type   Musculoskeletal: Strength & Muscle Tone: within normal limits Gait & Station: normal Patient leans: N/A  Psychiatric Specialty Exam: Physical Exam  Nursing note and vitals reviewed.   Review of Systems  Constitutional:  Negative for chills and fever.  Respiratory: Negative for cough.   Cardiovascular: Negative for chest pain and palpitations.  Gastrointestinal: Negative for heartburn and nausea.  Psychiatric/Behavioral: Negative for depression, hallucinations, substance abuse and suicidal ideas.    Blood pressure 131/90, pulse 72, temperature 98.5 F (36.9 C), temperature source Oral, resp. rate 18, height 5\' 7"  (1.702 m), weight 71.7 kg (158 lb).Body mass index is 24.75 kg/m.  General Appearance: Casual and Disheveled  Eye Contact:  Good  Speech:  Garbled and Normal Rate  Volume:  Normal  Mood:  Anxious  Affect:  Blunt, Constricted and Flat  Thought Process:  Coherent and Goal Directed  Orientation:  Full (Time, Place, and Person)  Thought Content:  Logical and Paranoid Ideation  Suicidal Thoughts:  No  Homicidal Thoughts:  No  Memory:  Immediate;   Good Recent;   Good Remote;   Good  Judgement:  Fair  Insight:  Lacking  Psychomotor Activity:  Normal  Concentration:  Concentration: Good  Recall:  [place] of Knowledge:  Fair  Language:  Fair  Akathisia:  No  Handed:    AIMS (if indicated):     Assets:  Financial Resources/Insurance Housing Resilience Social Support  ADL's:  Intact  Cognition:  WNL  Sleep:  Number of Hours: 6.75        COGNITIVE FEATURES THAT CONTRIBUTE TO RISK:  Closed-mindedness and Thought constriction (tunnel vision)    SUICIDE RISK:   Minimal: No identifiable suicidal ideation.  Patients presenting with no risk factors but with morbid ruminations; may be classified as minimal risk based on the severity of the depressive symptoms  PLAN OF CARE:  -  Admit to inpatient level of care - Labs: CBC, CMP, UDS, prolactin, TSH, lipid panel, HGBA1c - ECG to monitor for QTc after restarting psychotropic medications - Consider Depakote level after being back on depakote for 5 days - Schizoaffective disorder  - Continue depakote ER 1000mg  qhs  - Continue seroquel  300mg  qhs   - Continue ativan 1mg  BID - Encourage participation in groups and the therapeutic milieu - Discharge planning will be ongoing  I certify that inpatient services furnished can reasonably be expected to improve the patient's condition.   Pennelope Bracken, MD 07/27/2017, 4:13 PM

## 2017-07-27 NOTE — H&P (Signed)
Psychiatric Admission Assessment Adult  Patient Identification: Franklin Ayers MRN:  [id number] Date of Evaluation:  07/27/2017 Chief Complaint:  schizoaffective disorder Principal Diagnosis: Schizoaffective disorder, bipolar type ([place]) Diagnosis:   Patient Active Problem List   Diagnosis Date Noted  . Schizoaffective disorder, bipolar type ([place]) [F25.0] 12/23/2016   History of Present Illness:   Franklin Ayers is 56 y/o M with history of schizoaffective disorder who was admitted with worsening symptoms of psychosis including not eating, not drinking, not talking, not taking medications, and not getting out of bed. Pt has recent relevant history of living in a group home for about 10 years and then recently moving back in with his mother. Once moved in with his mother, pt had worsening of his ADL's and recently he had not been eating, drinking, bathing, taking his medications, or leaving his bed. He was taken initially to the ED at which time he had improvement of his behaviors; he was observed eating, drinking, bathing, and he was adherent to the prescribed treatment regimen. He was transferred to [facility] for additional treatment and stabilization.  Upon evaluation today, pt reports his reasons for coming to the hospital, stating, " My body has a low factor of some kind of vitamin thing in my blood." Pt was asked about concerns that he was not meeting his ADL's at home, and he acknowledges that he was not. He does not have a reason or explanation why he was not eating/drinking, bathing, or taking his medications. He denies feeling depressed. He denies SI/HI/AH/VH. He notes some general anxiety, explaining, "I feel unbalanced." He denies symptoms of depression, mania, OCD, and PTSD. He denies illicit substance use.   Discussed with patient about treatment options. He reports feeling ambivalent about his medications, stating, "I'm not sure if they help." Pt was restarted on home medications of  depakote and seroquel, and he reports that he is tolerating them without difficulty. He has some insight into his presenting symptoms, stating, "Changing my medications won't help me eat - I just gotta do it." Pt agrees to follow recommendations and regulations while admitted at [facility]. He had no further questions, comments, or concerns.  Associated Signs/Symptoms: Depression Symptoms:  psychomotor retardation, anxiety, loss of energy/fatigue, decreased appetite, (Hypo) Manic Symptoms:  Distractibility, Anxiety Symptoms:  Excessive Worry, Psychotic Symptoms:  Paranoia, PTSD Symptoms: NA Total Time spent with patient: 1 hour  Past Psychiatric History:  - Pt reports history of schizophrenia - He has multiple inpatient hospitalizations, but he is unable to name when his last hospital stay was. He has presented to ED twice in the last year with psychiatric complaints but he was discharged from ED - Pt has outpatient providers but he is unable to name them at time of interview  Is the patient at risk to self? Yes.    Has the patient been a risk to self in the past 6 months? Yes.    Has the patient been a risk to self within the distant past? Yes.    Is the patient a risk to others? No.  Has the patient been a risk to others in the past 6 months? No.  Has the patient been a risk to others within the distant past? No.   Prior Inpatient Therapy:   Prior Outpatient Therapy:    Alcohol Screening: 1. How often do you have a drink containing alcohol?: Monthly or less 2. How many drinks containing alcohol do you have on a typical day when you are drinking?: 1 or  2 3. How often do you have six or more drinks on one occasion?: Never AUDIT-C Score: 1 4. How often during the last year have you found that you were not able to stop drinking once you had started?: Never 5. How often during the last year have you failed to do what was normally expected from you becasue of drinking?: Never 6. How often  during the last year have you needed a first drink in the morning to get yourself going after a heavy drinking session?: Never 7. How often during the last year have you had a feeling of guilt of remorse after drinking?: Never 8. How often during the last year have you been unable to remember what happened the night before because you had been drinking?: Never 9. Have you or someone else been injured as a result of your drinking?: No 10. Has a relative or friend or a doctor or another health worker been concerned about your drinking or suggested you cut down?: No Alcohol Use Disorder Identification Test Final Score (AUDIT): 1 Intervention/Follow-up: AUDIT Score <7 follow-up not indicated Substance Abuse History in the last 12 months:  Yes.   Consequences of Substance Abuse: NA Previous Psychotropic Medications: Yes  Psychological Evaluations: Yes  Past Medical History:  Past Medical History:  Diagnosis Date  . Mental disorder   . Schizoaffective disorder [facility])     Past Surgical History:  Procedure Laterality Date  . TONSILLECTOMY     Family History:  Family History  Problem Relation Age of Onset  . Cancer Other   . Stroke Other    Family Psychiatric  History: pt denies family psychiatric history Tobacco Screening: Have you used any form of tobacco in the last 30 days? (Cigarettes, Smokeless Tobacco, Cigars, and/or Pipes): Yes Tobacco use, Select all that apply: 5 or more cigarettes per day Are you interested in Tobacco Cessation Medications?: No, patient refused Counseled patient on smoking cessation including recognizing danger situations, developing coping skills and basic information about quitting provided: Refused/Declined practical counseling Social History:  Social History   Substance and Sexual Activity  Alcohol Use Yes   Comment: occasional     Social History   Substance and Sexual Activity  Drug Use No    Additional Social History: Marital status: Single Are  you sexually active?: No What is your sexual orientation?: Heterosexual  Does patient have children?: No                         Allergies:   Allergies  Allergen Reactions  . Penicillins Shortness Of Breath    .Has patient had a PCN reaction causing immediate rash, facial/tongue/throat swelling, SOB or lightheadedness with hypotension: no Has patient had a PCN reaction causing severe rash involving mucus membranes or skin necrosis: no Has patient had a PCN reaction that required hospitalization: no Has patient had a PCN reaction occurring within the last 10 years: no If all of the above answers are "NO", then may proceed with Cephalosporin use.    Lab Results: No results found for this or any previous visit (from the past 48 hour(s)).  Blood Alcohol level:  Lab Results  Component Value Date   ETH <10 07/24/2017   ETH <10 16/06/9603    Metabolic Disorder Labs:  No results found for: HGBA1C, MPG No results found for: PROLACTIN No results found for: CHOL, TRIG, HDL, CHOLHDL, VLDL, LDLCALC  Current Medications: Current Facility-Administered Medications  Medication Dose Route Frequency Provider  Last Rate Last Dose  . acetaminophen (TYLENOL) tablet 650 mg  650 mg Oral Q6H PRN Ethelene Hal, NP      . alum & mag hydroxide-simeth (MAALOX/MYLANTA) 200-200-20 MG/5ML suspension 30 mL  30 mL Oral Q4H PRN Ethelene Hal, NP      . divalproex (DEPAKOTE ER) 24 hr tablet 1,000 mg  1,000 mg Oral QHS Ethelene Hal, NP   1,000 mg at 07/26/17 2125  . LORazepam (ATIVAN) tablet 1 mg  1 mg Oral BID Ethelene Hal, NP   Stopped at 07/27/17 0900  . magnesium hydroxide (MILK OF MAGNESIA) suspension 30 mL  30 mL Oral Daily PRN Ethelene Hal, NP      . ondansetron [facility]) tablet 4 mg  4 mg Oral Q8H PRN Ethelene Hal, NP      . QUEtiapine (SEROQUEL) tablet 300 mg  300 mg Oral QHS Ethelene Hal, NP   300 mg at 07/26/17 2125   PTA  Medications: Medications Prior to Admission  Medication Sig Dispense Refill Last Dose  . divalproex (DEPAKOTE ER) 500 MG 24 hr tablet Take 2 tablets (1,000 mg total) by mouth at bedtime. (Patient not taking: Reported on 07/17/2017) 60 tablet 0 Not Taking at Unknown time  . OLANZapine (ZYPREXA) 5 MG tablet Take 1 tablet (5 mg total) by mouth at bedtime. (Patient not taking: Reported on 07/17/2017) 30 tablet 0 Not Taking at Unknown time  . QUEtiapine (SEROQUEL) 100 MG tablet Take 1 tablet (100 mg total) by mouth 3 (three) times daily. (Patient not taking: Reported on 07/17/2017) 90 tablet 0 Not Taking at Unknown time    Musculoskeletal: Strength & Muscle Tone: within normal limits Gait & Station: normal Patient leans: N/A  Psychiatric Specialty Exam: Physical Exam  Nursing note and vitals reviewed.   Review of Systems  Constitutional: Negative for chills and fever.  Respiratory: Negative for cough.   Cardiovascular: Negative for chest pain.  Gastrointestinal: Negative for abdominal pain, heartburn, nausea and vomiting.  Psychiatric/Behavioral: Positive for depression. Negative for hallucinations and suicidal ideas. The patient is nervous/anxious.     Blood pressure 131/90, pulse 72, temperature 98.5 F (36.9 C), temperature source Oral, resp. rate 18, height 5\' 7"  (1.702 m), weight 71.7 kg (158 lb).Body mass index is 24.75 kg/m.  General Appearance: Casual and Disheveled  Eye Contact:  Good  Speech:  Garbled and Normal Rate  Volume:  Normal  Mood:  Anxious  Affect:  Blunt, Constricted and Flat  Thought Process:  Coherent and Goal Directed  Orientation:  Full (Time, Place, and Person)  Thought Content:  Logical and Paranoid Ideation  Suicidal Thoughts:  No  Homicidal Thoughts:  No  Memory:  Immediate;   Good Recent;   Good Remote;   Good  Judgement:  Fair  Insight:  Lacking  Psychomotor Activity:  Normal  Concentration:  Concentration: Good  Recall:  [place] of Knowledge:   Fair  Language:  Fair  Akathisia:  No  Handed:    AIMS (if indicated):     Assets:  Financial Resources/Insurance Housing Resilience Social Support  ADL's:  Intact  Cognition:  WNL  Sleep:  Number of Hours: 6.75    Treatment Plan Summary: Daily contact with patient to assess and evaluate symptoms and progress in treatment and Medication management  Observation Level/Precautions:  15 minute checks  Laboratory:  CBC Chemistry Profile HbAIC HCG UDS  Psychotherapy:  Encourage participation in groups and therapeutic milieu  Medications:  Restart depakote 1000mg  qhs, restart seroquel 300mg  qhs  Consultations:    Discharge Concerns:    Estimated LOS: 5-7 days  Other:     Physician Treatment Plan for Primary Diagnosis: Schizoaffective disorder, bipolar type ([place]) Long Term Goal(s): Improvement in symptoms so as ready for discharge  Short Term Goals: Ability to demonstrate self-control will improve  Physician Treatment Plan for Secondary Diagnosis: Principal Problem:   Schizoaffective disorder, bipolar type ([place])  Long Term Goal(s): Improvement in symptoms so as ready for discharge  Short Term Goals: Ability to identify changes in lifestyle to reduce recurrence of condition will improve, Compliance with prescribed medications will improve and Ability to identify triggers associated with substance abuse/mental health issues will improve  I certify that inpatient services furnished can reasonably be expected to improve the patient's condition.    Pennelope Bracken, MD 11/15/20183:54 PM

## 2017-07-27 NOTE — BHH Suicide Risk Assessment (Signed)
Franklin Ayers [place] INPATIENT:  Family/Significant Other Suicide Prevention Education  Suicide Prevention Education:  Education Completed; Franklin Ayers [phone] (Mother) has been identified by the patient as the family member/significant other with whom the patient will be residing, and identified as the person(s) who will aid the patient in the event of a mental health crisis (suicidal ideations/suicide attempt).  With written consent from the patient, the family member/significant other has been provided the following suicide prevention education, prior to the and/or following the discharge of the patient.  The suicide prevention education provided includes the following:  Suicide risk factors  Suicide prevention and interventions  National Suicide Hotline telephone number  [facility] assessment telephone number  [facility] Emergency Assistance [place] and/or Residential Mobile Crisis Unit telephone number  Request made of family/significant other to:  Remove weapons (e.g., guns, rifles, knives), all items previously/currently identified as safety concern.    Remove drugs/medications (over-the-counter, prescriptions, illicit drugs), all items previously/currently identified as a safety concern.  The family member/significant other verbalizes understanding of the suicide prevention education information provided.  The family member/significant other agrees to remove the items of safety concern listed above.  Franklin Ayers was contact to complete the intake assessment for Franklin Ayers.  Franklin Ayers states that Franklin Ayers has been living with her for 1 year because he got into an argument with the manager of the assisted living facility and refused to return there.  Franklin Ayers states that at home he sleeps a lot and does not leave the house unless it is with her.  He tells her that he wants to die and that he is tired of living.  Franklin Ayers is 56 years old and does not feel that Franklin Ayers can care  for Franklin Ayers any longer.  Franklin Ayers would like for him to go to an assisted living facility upon discharge.  Franklin Ayers believes that he has given up on life and wants him to be at a facility that can watch him and provide him with his medications.     Franklin Ayers 07/27/2017, 10:13 AM

## 2017-07-27 NOTE — BHH Counselor (Signed)
Adult Comprehensive Assessment  Patient ID: Franklin Ayers, male   DOB: 08/26/1961, 56 y.o.   MRN: [id number]  Information Source: Information source: Patient(Franklin Ayers (mother))  Current Stressors:  Educational / Learning stressors: N/A Employment / Job issues: Pt is has been on disability for 16 years  Family Relationships: Only family is his mother Museum/gallery curator / Lack of resources (include bankruptcy): On disability Housing / Lack of housing: Pt lives with his mother  Physical health (include injuries & life threatening diseases): N/A Social relationships: Pt has no social relationships  Substance abuse: N/A Bereavement / Loss: N/A  Living/Environment/Situation:  Living Arrangements: Parent Living conditions (as described by patient or guardian): "He enjoys living here, but he sleeps a lot" How long has patient lived in current situation?: 1 year  What is atmosphere in current home: Comfortable  Family History:  Marital status: Single Are you sexually active?: No What is your sexual orientation?: Heterosexual  Does patient have children?: No  Childhood History:  By whom was/is the patient raised?: Both parents Description of patient's relationship with caregiver when they were a child: "Wonderful" Patient's description of current relationship with people who raised him/her: "The same" How were you disciplined when you got in trouble as a child/adolescent?: Father was strict  Does patient have siblings?: Yes Number of Siblings: 1 Description of patient's current relationship with siblings: "She would not take care of him if something happened to me" Did patient suffer any verbal/emotional/physical/sexual abuse as a child?: No Did patient suffer from severe childhood neglect?: No Has patient ever been sexually abused/assaulted/raped as an adolescent or adult?: No Was the patient ever a victim of a crime or a disaster?: No Witnessed domestic violence?: No Has patient  been effected by domestic violence as an adult?: No  Education:  Highest grade of school patient has completed: 12th Currently a student?: No Learning disability?: No  Employment/Work Situation:   Employment situation: On disability Why is patient on disability: Mental Health issues  How long has patient been on disability: 16 years Patient's job has been impacted by current illness: Yes Describe how patient's job has been impacted: Franklin Ayers issues What is the longest time patient has a held a job?: [place]  Where was the patient employed at that time?: Unknown  Has patient ever been in the [employer]?: No Has patient ever served in combat?: No Did You Receive Any Psychiatric Treatment/Services While in Passenger transport manager?: No Are There Guns or Other Weapons in [place]?: No Are These Weapons Safely Secured?: Yes  Financial Resources:   Financial resources: Murriel Hopper, Medicaid Does patient have a Programmer, applications or guardian?: Yes Name of representative payee or guardian: Franklin Ayers [phone]  Alcohol/Substance Abuse:   What has been your use of drugs/alcohol within the last 12 months?: N/A If attempted suicide, did drugs/alcohol play a role in this?: No Alcohol/Substance Abuse Treatment Hx: Denies past history Has alcohol/substance abuse ever caused legal problems?: No  Social Support System:   Pensions consultant Support System: Poor Describe Community Support System: Mother  Type of faith/religion: N/A How does patient's faith help to cope with current illness?: N/A  Leisure/Recreation:   Leisure and Hobbies: "None as of recently"  Strengths/Needs:   What things does the patient do well?: Unknown In what areas does patient struggle / problems for patient: Unknown   Discharge Plan:   Does patient have access to transportation?: Yes Will patient be returning to same living situation after discharge?: No Plan for living  situation after discharge:  Mother would like him to go to an assisted living facility  Currently receiving community mental health services: Yes (From Whom)([facility] [place], Dr. Filomena Jungling ) If no, would patient like referral for services when discharged?: Yes (What county?)([place] ) Does patient have financial barriers related to discharge medications?: No  Summary/Recommendations:   Summary and Recommendations (to be completed by the evaluator): Franklin Ayers is a 56 year old [place] male who has been diagnosed with Schizoaffective, bipolar type.  He was unable to actively particpate in the assessment, so his mother, Franklin Ayers, was contacted by phone.  She is 54 years old and does not feel that she can continue to care for [name].  She would like him to go to an assisted living facility upon discharge.  She states that he is currently receiving his medications from [facility] [place].  While in the hospital he can benefit from crisis stabilization, medication management, therapeutic milieu, and a referral for services.   Franklin Ayers. 07/27/2017

## 2017-07-28 LAB — PROLACTIN: Prolactin: 4.8 ng/mL (ref 4.0–15.2)

## 2017-07-28 NOTE — BHH Group Notes (Signed)
[facility] LCSW Group Therapy  07/28/2017  1:05 PM  Type of Therapy:  Group therapy  Participation Level:  Active  Participation Quality:  Attentive  Affect:  Flat  Cognitive:  Oriented  Insight:  Limited  Engagement in Therapy:  Limited  Modes of Intervention:  Discussion, Socialization  Summary of Progress/Problems:  Chaplain was here to lead a group on themes of hope and courage.  "Hope is something that helped me get through school.  Hope is strength and determination. That strength is still present today.  It keeps me striving." Cited his teachers as people who have given him hope and support along the way.  Reminisced about living in the [place] and his warm feelings from early years.  Franklin Ayers 07/28/2017 1:31 PM

## 2017-07-28 NOTE — Progress Notes (Signed)
DAR NOTE: Patient presents with irritable affect and anxious mood.  Denies pain, auditory and visual hallucinations.  Described energy level as high and concentration as good.  Rates depression at 0, hopelessness at 0, and anxiety at 0.  Maintained on routine safety checks.  Medications given as prescribed with several attempt and encouragement.  Support and encouragement offered as needed.  Attended group and participated.  States goal for today is "group."  Patient visible in milieu watching TV.  Minimal interaction with staff and peers. Offered no complaint.

## 2017-07-28 NOTE — Progress Notes (Signed)
Recreation Therapy Notes  INPATIENT RECREATION THERAPY ASSESSMENT  Patient Details Name: Franklin Ayers MRN: [id number] DOB: 09/17/1960 Today's Date: 07/28/2017  Patient Stressors: Other (Comment)(Poor diet)  Pt stated he was here because he had some teeth removed and he hasn't been eating properly.  Coping Skills:   Avoidance, Exercise, Art/Dance, Talking, Music  Personal Challenges: Self-Esteem/Confidence, Social Interaction, Work Midwife (2+):  Individual - Reading, Art - Draw  Awareness of Community Resources:  Yes  Community Resources:  Coffee Shop, Other (Comment)(Stores)  Current Use: Yes  Patient Strengths:  Ability to reason  Patient Identified Areas of Improvement:  Problem-solving  Current Recreation Participation:  Everyday  Patient Goal for Hospitalization:  "Increase appetite"  [place] of Residence:  [place] of Residence:  [place]  Current [place] (including self-harm):  No  Current HI:  No  Consent to Intern Participation: N/A   Victorino Sparrow, LRT/CTRS  Victorino Sparrow A 07/28/2017, 12:53 PM

## 2017-07-28 NOTE — Progress Notes (Signed)
D: Pt stayed sleep majority of the evening A: . Pt was encourage to attend groups. Q 15 minute checks were done for safety.  R: safety maintained on unit.

## 2017-07-28 NOTE — Progress Notes (Signed)
[facility] MD Progress Note  07/28/2017 1:11 PM Franklin Ayers  MRN:  [id number] Subjective:   Franklin Ayers is a 56 y/o M with history of schizoaffective disorder who was admitted with worsening psychosis, inability to meet his basic needs, and medication non-adherence. He was restarted on medications and he has been observed on the inpatient unit. Today upon evaluation, pt reports he is feeling "about average." He notes that he is sleeping well except that his roommate was snoring. He reports his appetite is "getting better." He denies SI/HI/AH/VH. Pt had denied medications last evening as per RN report, but pt thinks that he received his medications and he does not recall declining them. He is in agreement to take his medications as currently prescribed tonight. He reports feeling drowsy today and he has not been attending groups regularly; pt was encouraged to participate in groups and the therapeutic milieu. He had no further questions, comments, or concerns.  Principal Problem: Schizoaffective disorder, bipolar type ([place]) Diagnosis:   Patient Active Problem List   Diagnosis Date Noted  . Schizoaffective disorder, bipolar type ([place]) [F25.0] 12/23/2016   Total Time spent with patient: 30 minutes  Past Psychiatric History: see H&P  Past Medical History:  Past Medical History:  Diagnosis Date  . Mental disorder   . Schizoaffective disorder [facility])     Past Surgical History:  Procedure Laterality Date  . TONSILLECTOMY     Family History:  Family History  Problem Relation Age of Onset  . Cancer Other   . Stroke Other    Family Psychiatric  History: see H&P Social History:  Social History   Substance and Sexual Activity  Alcohol Use Yes   Comment: occasional     Social History   Substance and Sexual Activity  Drug Use No    Social History   Socioeconomic History  . Marital status: Single    Spouse name: None  . Number of children: None  . Years of education: None  .  Highest education level: None  Social Needs  . Financial resource strain: None  . Food insecurity - worry: None  . Food insecurity - inability: None  . Transportation needs - medical: None  . Transportation needs - non-medical: None  Occupational History  . None  Tobacco Use  . Smoking status: Current Every Day Smoker    Packs/day: 2.00    Types: Cigarettes  . Smokeless tobacco: Never Used  . Tobacco comment: refused  Substance and Sexual Activity  . Alcohol use: Yes    Comment: occasional  . Drug use: No  . Sexual activity: No  Other Topics Concern  . None  Social History Narrative  . None   Additional Social History:                         Sleep: Good  Appetite:  Good  Current Medications: Current Facility-Administered Medications  Medication Dose Route Frequency Provider Last Rate Last Dose  . acetaminophen (TYLENOL) tablet 650 mg  650 mg Oral Q6H PRN Ethelene Hal, NP      . alum & mag hydroxide-simeth (MAALOX/MYLANTA) 200-200-20 MG/5ML suspension 30 mL  30 mL Oral Q4H PRN Ethelene Hal, NP      . divalproex (DEPAKOTE ER) 24 hr tablet 1,000 mg  1,000 mg Oral QHS Ethelene Hal, NP   1,000 mg at 07/26/17 2125  . LORazepam (ATIVAN) tablet 1 mg  1 mg Oral BID Ethelene Hal, NP  1 mg at 07/28/17 1042  . magnesium hydroxide (MILK OF MAGNESIA) suspension 30 mL  30 mL Oral Daily PRN Ethelene Hal, NP      . ondansetron [facility]) tablet 4 mg  4 mg Oral Q8H PRN Ethelene Hal, NP      . QUEtiapine (SEROQUEL) tablet 300 mg  300 mg Oral QHS Ethelene Hal, NP   300 mg at 07/26/17 2125  . tuberculin injection 5 Units  5 Units Intradermal Once Pennelope Bracken, MD        Lab Results:  Results for orders placed or performed during the hospital encounter of 07/26/17 (from the past 48 hour(s))  Prolactin     Status: None   Collection Time: 07/27/17  6:26 PM  Result Value Ref Range   Prolactin 4.8 4.0 - 15.2  ng/mL    Comment: (NOTE) Performed At: [facility] [place], [place] [id number] Rush Farmer MD [id number] Performed at [facility], [place] [address], [place], [place] [address]   TSH     Status: None   Collection Time: 07/27/17  6:26 PM  Result Value Ref Range   TSH 1.213 0.350 - 4.500 uIU/mL    Comment: Performed by a 3rd Generation assay with a functional sensitivity of <=0.01 uIU/mL. Performed at [facility], [place] [address], [place], [place] [address]   Lipid panel     Status: Abnormal   Collection Time: 07/27/17  6:26 PM  Result Value Ref Range   Cholesterol 105 0 - 200 mg/dL   Triglycerides 99 <150 mg/dL   HDL 40 (L) >40 mg/dL   Total CHOL/HDL Ratio 2.6 RATIO   VLDL 20 0 - 40 mg/dL   LDL Cholesterol 45 0 - 99 mg/dL    Comment:        Total Cholesterol/HDL:CHD Risk Coronary Heart Disease Risk Table                     Men   Women  1/2 Average Risk   3.4   3.3  Average Risk       5.0   4.4  2 X Average Risk   9.6   7.1  3 X Average Risk  23.4   11.0        Use the calculated Patient Ratio above and the CHD Risk Table to determine the patient's CHD Risk.        ATP III CLASSIFICATION (LDL):  <100     mg/dL   Optimal  100-129  mg/dL   Near or Above                    Optimal  130-159  mg/dL   Borderline  160-189  mg/dL   High  >190     mg/dL   Very High Performed at [facility] [address], [place], [place] [address]   Hemoglobin A1c     Status: None   Collection Time: 07/27/17  6:26 PM  Result Value Ref Range   Hgb A1c MFr Bld 5.6 4.8 - 5.6 %    Comment: (NOTE) Pre diabetes:          5.7%-6.4% Diabetes:              >6.4% Glycemic control for   <7.0% adults with diabetes    Mean Plasma Glucose 114.02 mg/dL    Comment: Performed at [facility]  [address] [place], [place] [address]    Blood Alcohol level:  Lab Results  Component Value Date   [facility] <10 07/24/2017    ETH <10 06/18/1218    Metabolic Disorder Labs: Lab Results  Component Value Date   HGBA1C 5.6 07/27/2017   MPG 114.02 07/27/2017   Lab Results  Component Value Date   PROLACTIN 4.8 07/27/2017   Lab Results  Component Value Date   CHOL 105 07/27/2017   TRIG 99 07/27/2017   HDL 40 (L) 07/27/2017   CHOLHDL 2.6 07/27/2017   VLDL 20 07/27/2017   LDLCALC 45 07/27/2017    Physical Findings: AIMS: Facial and Oral Movements Muscles of Facial Expression: None, normal Lips and Perioral Area: None, normal Jaw: None, normal Tongue: None, normal,Extremity Movements Upper (arms, wrists, hands, fingers): None, normal Lower (legs, knees, ankles, toes): None, normal, Trunk Movements Neck, shoulders, hips: None, normal, Overall Severity Severity of abnormal movements (highest score from questions above): None, normal Incapacitation due to abnormal movements: None, normal Patient's awareness of abnormal movements (rate only patient's report): No Awareness, Dental Status Current problems with teeth and/or dentures?: Yes Does patient usually wear dentures?: No  CIWA:    COWS:     Musculoskeletal: Strength & Muscle Tone: within normal limits Gait & Station: normal Patient leans: Backward  Psychiatric Specialty Exam: Physical Exam  Nursing note and vitals reviewed.   Review of Systems  Constitutional: Negative for chills and fever.  Respiratory: Negative for cough.   Cardiovascular: Negative for chest pain.  Gastrointestinal: Negative for abdominal pain, heartburn and nausea.  Psychiatric/Behavioral: Negative for depression, hallucinations and suicidal ideas.    Blood pressure 116/61, pulse (!) 57, temperature 97.9 F (36.6 C), temperature source Oral, resp. rate 18, height 5\' 7"  (1.702 m), weight 71.7 kg (158 lb).Body mass index is 24.75 kg/m.  General Appearance: Casual and Fairly Groomed  Eye Contact:  Good  Speech:  Clear and Coherent and Normal Rate  Volume:  Normal  Mood:   Anxious and Irritable  Affect:  Congruent, Constricted and Flat  Thought Process:  Coherent and Goal Directed  Orientation:  Full (Time, Place, and Person)  Thought Content:  Logical  Suicidal Thoughts:  No  Homicidal Thoughts:  No  Memory:  Immediate;   Fair Recent;   Fair Remote;   Fair  Judgement:  Fair  Insight:  Fair  Psychomotor Activity:  Normal  Concentration:  Concentration: Good  Recall:  [place] of Knowledge:  Fair  Language:  Fair  Akathisia:  No  Handed:    AIMS (if indicated):     Assets:  Armed forces logistics/support/administrative officer Physical Health Resilience Social Support  ADL's:  Intact  Cognition:  WNL  Sleep:  Number of Hours: 6.75     Treatment Plan Summary: Daily contact with patient to assess and evaluate symptoms and progress in treatment and Medication management. Pt reports that his mood is stable and his appetite is improving. He has been withdrawn and isolating to his room, so he was encouraged to participate in groups and the therapeutic milieu. He refused his medications last night, but he denies that he has been refusing medications and he is in agreement to take prescribed treatment plan tonight.  - Continue inpatient hospitalization - Schizoaffective disorder             - Continue depakote ER 1000mg  qhs             - Continue seroquel 300mg  qhs             -  Continue ativan 1mg  BID - Encourage participation in groups and the therapeutic milieu - Discharge planning will be ongoing  Pennelope Bracken, MD 07/28/2017, 1:11 PM

## 2017-07-28 NOTE — Progress Notes (Signed)
Recreation Therapy Notes  Date: 07/28/17 Time: 1000 Location: 500 Hall Dayroom   Group Topic: Communication, Team Building, Problem Solving  Goal Area(s) Addresses:  Patient will effectively work with peer towards shared goal.  Patient will identify skills used to make activity successful.  Patient will identify how skills used during activity can be used to reach post d/c goals.   Intervention: STEM Activity  Activity: Geophysicist/field seismologist. In teams patients were given 12 plastic drinking straws and a length of masking tape. Using the materials provided patients were asked to build a landing pad to catch a golf ball dropped from approximately 6 feet in the air.   Education: Education officer, community, Discharge Planning   Education Outcome: Acknowledges education/In group clarification offered/Needs additional education.   Clinical Observations/Feedback: Pt did not attend group.     Victorino Sparrow, LRT/CTRS        Victorino Sparrow A 07/28/2017 11:33 AM

## 2017-07-28 NOTE — Plan of Care (Signed)
Pt safe on the unit

## 2017-07-29 NOTE — BHH Group Notes (Signed)
[facility] Group Notes: (Clinical Social Work)   07/29/2017      Type of Therapy:  Group Therapy   Participation Level:  Did Not Attend despite MHT prompting   Selmer Dominion, LCSW 07/29/2017, 12:15 PM

## 2017-07-29 NOTE — Plan of Care (Signed)
Pt took medications as scheduled this evening, pt safe on the unit at this time

## 2017-07-29 NOTE — Progress Notes (Signed)
D: Pt denies SI/HI/AVH, " I'm fine". Pt forwards little and has a matter of fact attitude, pt avoids writer this evening.  A: Pt was offered support and encouragement. Pt was given scheduled medications. Pt was encourage to attend groups. Q 15 minute checks were done for safety.  R:Pt is taking medication. Pt has no complaints.Pt receptive to treatment and safety maintained on unit.

## 2017-07-29 NOTE — Progress Notes (Signed)
[facility] MD Progress Note  07/29/2017 12:16 PM Franklin Ayers  MRN:  [id number] Subjective:  Patient states " medication is helping". Denies suicidal ideations. Does not endorse medication side effects at this time. Objective : I have discussed case with RN and have met with patient . 56 year old male, history of chronic mental illness, has been diagnosed with  schizoaffective disorder , admitted due to worsening psychosis, decreased level of functioning ,in the context of medication non compliance. As per staff has been denying /minimizing symptoms, presenting vaguely anxious, visible in day room, but with minimal interactions with others . Patient presents in bed, superficially cooperative with session, answering questions with monosyllables or short phrases. States he feels medications are helping , and currently denies hallucinations, denies suicidal ideations, denies homicidal ideations, and describes mood as "OK".  Insight is limited, and states he is in the hospital for help with his " appetite". Denies medication side effects. States he is unsure what his plan will be on discharge. States " maybe live with my mother".  Principal Problem: Schizoaffective disorder, bipolar type ([place]) Diagnosis:   Patient Active Problem List   Diagnosis Date Noted  . Schizoaffective disorder, bipolar type ([place]) [F25.0] 12/23/2016   Total Time spent with patient: 20 minutes  Past Psychiatric History: see H&P  Past Medical History:  Past Medical History:  Diagnosis Date  . Mental disorder   . Schizoaffective disorder [facility])     Past Surgical History:  Procedure Laterality Date  . TONSILLECTOMY     Family History:  Family History  Problem Relation Age of Onset  . Cancer Other   . Stroke Other    Family Psychiatric  History: see H&P Social History:  Social History   Substance and Sexual Activity  Alcohol Use Yes   Comment: occasional     Social History   Substance and Sexual Activity   Drug Use No    Social History   Socioeconomic History  . Marital status: Single    Spouse name: None  . Number of children: None  . Years of education: None  . Highest education level: None  Social Needs  . Financial resource strain: None  . Food insecurity - worry: None  . Food insecurity - inability: None  . Transportation needs - medical: None  . Transportation needs - non-medical: None  Occupational History  . None  Tobacco Use  . Smoking status: Current Every Day Smoker    Packs/day: 2.00    Types: Cigarettes  . Smokeless tobacco: Never Used  . Tobacco comment: refused  Substance and Sexual Activity  . Alcohol use: Yes    Comment: occasional  . Drug use: No  . Sexual activity: No  Other Topics Concern  . None  Social History Narrative  . None   Additional Social History:   Sleep: Good  Appetite:  improving   Current Medications: Current Facility-Administered Medications  Medication Dose Route Frequency Provider Last Rate Last Dose  . acetaminophen (TYLENOL) tablet 650 mg  650 mg Oral Q6H PRN Ethelene Hal, NP      . alum & mag hydroxide-simeth (MAALOX/MYLANTA) 200-200-20 MG/5ML suspension 30 mL  30 mL Oral Q4H PRN Ethelene Hal, NP      . divalproex (DEPAKOTE ER) 24 hr tablet 1,000 mg  1,000 mg Oral QHS Ethelene Hal, NP   1,000 mg at 07/26/17 2125  . LORazepam (ATIVAN) tablet 1 mg  1 mg Oral BID Ethelene Hal, NP  1 mg at 07/29/17 0841  . magnesium hydroxide (MILK OF MAGNESIA) suspension 30 mL  30 mL Oral Daily PRN Ethelene Hal, NP      . ondansetron [facility]) tablet 4 mg  4 mg Oral Q8H PRN Ethelene Hal, NP      . QUEtiapine (SEROQUEL) tablet 300 mg  300 mg Oral QHS Ethelene Hal, NP   300 mg at 07/26/17 2125  . tuberculin injection 5 Units  5 Units Intradermal Once Pennelope Bracken, MD        Lab Results:  Results for orders placed or performed during the hospital encounter of 07/26/17  (from the past 48 hour(s))  Prolactin     Status: None   Collection Time: 07/27/17  6:26 PM  Result Value Ref Range   Prolactin 4.8 4.0 - 15.2 ng/mL    Comment: (NOTE) Performed At: [facility] [place], [place] [id number] Rush Farmer MD [id number] Performed at [facility], [place] [address] [place], [place] [address]   TSH     Status: None   Collection Time: 07/27/17  6:26 PM  Result Value Ref Range   TSH 1.213 0.350 - 4.500 uIU/mL    Comment: Performed by a 3rd Generation assay with a functional sensitivity of <=0.01 uIU/mL. Performed at [facility], [place] [address] [place], [place] [address]   Lipid panel     Status: Abnormal   Collection Time: 07/27/17  6:26 PM  Result Value Ref Range   Cholesterol 105 0 - 200 mg/dL   Triglycerides 99 <150 mg/dL   HDL 40 (L) >40 mg/dL   Total CHOL/HDL Ratio 2.6 RATIO   VLDL 20 0 - 40 mg/dL   LDL Cholesterol 45 0 - 99 mg/dL    Comment:        Total Cholesterol/HDL:CHD Risk Coronary Heart Disease Risk Table                     Men   Women  1/2 Average Risk   3.4   3.3  Average Risk       5.0   4.4  2 X Average Risk   9.6   7.1  3 X Average Risk  23.4   11.0        Use the calculated Patient Ratio above and the CHD Risk Table to determine the patient's CHD Risk.        ATP III CLASSIFICATION (LDL):  <100     mg/dL   Optimal  100-129  mg/dL   Near or Above                    Optimal  130-159  mg/dL   Borderline  160-189  mg/dL   High  >190     mg/dL   Very High Performed at [facility] [address], [place], [place] [address]   Hemoglobin A1c     Status: None   Collection Time: 07/27/17  6:26 PM  Result Value Ref Range   Hgb A1c MFr Bld 5.6 4.8 - 5.6 %    Comment: (NOTE) Pre diabetes:          5.7%-6.4% Diabetes:              >6.4% Glycemic control for   <7.0% adults with diabetes    Mean Plasma Glucose 114.02 mg/dL    Comment: Performed at [facility], [address] [place], [place] [address]    Blood Alcohol level:  Lab Results  Component Value Date   [facility] <10 07/24/2017   ETH <10 60/45/4098    Metabolic Disorder Labs: Lab Results  Component Value Date   HGBA1C 5.6 07/27/2017   MPG 114.02 07/27/2017   Lab Results  Component Value Date   PROLACTIN 4.8 07/27/2017   Lab Results  Component Value Date   CHOL 105 07/27/2017   TRIG 99 07/27/2017   HDL 40 (L) 07/27/2017   CHOLHDL 2.6 07/27/2017   VLDL 20 07/27/2017   LDLCALC 45 07/27/2017    Physical Findings: AIMS: Facial and Oral Movements Muscles of Facial Expression: None, normal Lips and Perioral Area: None, normal Jaw: None, normal Tongue: None, normal,Extremity Movements Upper (arms, wrists, hands, fingers): None, normal Lower (legs, knees, ankles, toes): None, normal, Trunk Movements Neck, shoulders, hips: None, normal, Overall Severity Severity of abnormal movements (highest score from questions above): None, normal Incapacitation due to abnormal movements: None, normal Patient's awareness of abnormal movements (rate only patient's report): No Awareness, Dental Status Current problems with teeth and/or dentures?: Yes Does patient usually wear dentures?: No  CIWA:    COWS:     Musculoskeletal: Strength & Muscle Tone: within normal limits Gait & Station: normal Patient leans: N/A  Psychiatric Specialty Exam: Physical Exam  Nursing note and vitals reviewed.   Review of Systems  Constitutional: Negative for chills and fever.  Respiratory: Negative for cough.   Cardiovascular: Negative for chest pain.  Gastrointestinal: Negative for abdominal pain, heartburn and nausea.  Psychiatric/Behavioral: Negative for depression, hallucinations and suicidal ideas.  denies chest pain, no dyspnea, no vomiting   Blood pressure 118/75, pulse 85, temperature 97.7 F (36.5 C), temperature source Oral, resp. rate 16, height 5' 7"  (1.702 m), weight 71.7 kg  (158 lb).Body mass index is 24.75 kg/m.  General Appearance: Fairly Groomed  Eye Contact:  Fair  Speech:  decreased, answers questions with short phrases  Volume:  Normal  Mood:  denies feeling depressed  Affect:  blunted and vaguely anxious   Thought Process: linear, concrete   Orientation:  Other:  alert and attentive   Thought Content:  denies hallucinations, no delusions expressed today, does not appear internally preoccupied at this time  Suicidal Thoughts:  No denies suicidal or self injurious ideations at present, denies homicidal or violent ideations  Homicidal Thoughts:  No  Memory:  Immediate;   Fair Recent;   Fair Remote;   Fair  Judgement:  Fair  Insight:  Fair  Psychomotor Activity:  Decreased  Concentration:  Concentration: Fair and Attention Span: Fair  Recall:  [employer] of Knowledge:  Fair  Language:  Fair  Akathisia:  No  Handed:    AIMS (if indicated):     Assets:  Armed forces logistics/support/administrative officer Physical Health Resilience Social Support  ADL's:  Intact  Cognition:  WNL  Sleep:  Number of Hours: 8.75   Assessment - patient denies/ minimizes  depression , denies suicidal ideations, denies psychotic symptoms. Presents vaguely anxious, with limited speech, answering most questions with short phrases or monosyllables. Denies medication side effects. Focused on appetite issues, which he states has improved   Treatment Plan Summary: Treatment Plan reviewed today 11/17 as below Daily contact with patient to assess and evaluate symptoms and progress in treatment and Medication management.  - Continue inpatient hospitalization - Schizoaffective disorder             - Continue depakote ER 1036m  qhs             - Continue seroquel 379m qhs             - Continue ativan 120mBID - Encourage participation in groups and the therapeutic milieu - Discharge planning will be ongoing  FeJenne CampusMD 07/29/2017, 12:16 PM  Patient ID: [place]   DOB:  [id number].   MRN: [id number]

## 2017-07-29 NOTE — Progress Notes (Signed)
Did not attend group 

## 2017-07-29 NOTE — Progress Notes (Signed)
D: Zadrian has been calm, cooperative, and compliant with medications today. He remains isolative and interacts minimally. He was receptive to speaking with a Visual merchandiser for several minutes today. He has remained in the dayroom for much of the day, watching TV. He has complained of no medication side effects.  A: Meds given as ordered. Q15 safety checks maintained. Support/encouragement offered.  R: Pt remains free from harm and continues with treatment. Will continue to monitor for needs/safety.

## 2017-07-30 DIAGNOSIS — R450 Nervousness: Secondary | ICD-10-CM

## 2017-07-30 DIAGNOSIS — F39 Unspecified mood [affective] disorder: Secondary | ICD-10-CM

## 2017-07-30 DIAGNOSIS — F29 Unspecified psychosis not due to a substance or known physiological condition: Secondary | ICD-10-CM

## 2017-07-30 MED ORDER — LORAZEPAM 1 MG PO TABS: 1.0000 mg | ORAL_TABLET | Freq: Four times a day (QID) | ORAL | Status: DC | PRN

## 2017-07-30 MED ORDER — LORAZEPAM 0.5 MG PO TABS
0.5000 mg | ORAL_TABLET | Freq: Two times a day (BID) | ORAL | Status: DC
  Administered 2017-07-30 – 2017-07-31 (×2): 0.5 mg via ORAL
  Filled 2017-07-30 (×2): qty 1

## 2017-07-30 MED ORDER — LORAZEPAM 2 MG/ML IJ SOLN: 1.0000 mg | Freq: Four times a day (QID) | INTRAMUSCULAR | Status: DC | PRN

## 2017-07-30 NOTE — Progress Notes (Signed)
Adult Psychoeducational Group Note  Date:  07/30/2017 Time:  11:57 PM  Group Topic/Focus:  Wrap-Up Group:   The focus of this group is to help patients review their daily goal of treatment and discuss progress on daily workbooks.  Participation Level:  Did Not Attend  Participation Quality:  Did Not Attend  Affect:  Did Not Attend  Cognitive:  Did Not Attend  Insight: None  Engagement in Group:  Did Not Attend  Modes of Intervention:  Did Not Attend  Additional Comments:  Pt did not attend the evening wrap up group.  Candy Sledge 07/30/2017, 11:57 PM

## 2017-07-30 NOTE — Progress Notes (Signed)
D: Pt presents with a flat affect and depressed mood. Pt noted to be in bed all morning and afternoon. Pt refuses to attend meals in the cafeteria or participate in scheduled groups. Pt appears guarded and forwards little information. Pt is isolative to this room and refuses to come out of his room when prompted to do so.  A:  Orders reviewed with pt. Medications administered as ordered per MD. Verbal support provided. Pt encouraged to attend groups. 15 minute checks performed for safety. R: Pt denies any concerns.

## 2017-07-30 NOTE — BHH Group Notes (Signed)
[facility] Group Notes: (Clinical Social Work)   07/30/2017      Type of Therapy:  Group Therapy   Participation Level:  Did Not Attend despite MHT prompting   Selmer Dominion, LCSW 07/30/2017, 12:02 PM

## 2017-07-30 NOTE — Progress Notes (Addendum)
[facility] MD Progress Note  07/30/2017 10:57 AM Franklin Ayers  MRN:  [id number] Subjective: Patient states I am fine' .  Objective: Patient seen and chart reviewed.Discussed patient with treatment team.  I have reviewed previous notes in chart per Dr. Parke Poisson as well as Dr. Nancy Fetter.  Discussed patient with RN. Patient seen in bed, was able to participate in evaluation superficially.  Answered "yes or no "to questions asked.  Patient reported that he is doing okay.  Patient reports he slept' some' last night. When asked if the patient is groggy and if that is the reason he is staying in bed this morning, he reported that may be the combination of medication is making him sleepy.  Discussed reducing some of the daytime medication or change the dosing schedule, to address his sleepiness during the day.  He agrees with plan.  He denies any other new concerns. Per RN patient has been compliant on medication, no disruptive issues noted on the unit.   Principal Problem: Schizoaffective disorder, bipolar type ([place]) Diagnosis:   Patient Active Problem List   Diagnosis Date Noted  . Schizoaffective disorder, bipolar type ([place]) [F25.0] 12/23/2016   Total Time spent with patient: 25 minutes  Past Psychiatric History: Please see H&P.   Past Medical History:  Past Medical History:  Diagnosis Date  . Mental disorder   . Schizoaffective disorder [facility])     Past Surgical History:  Procedure Laterality Date  . TONSILLECTOMY     Family History:  Family History  Problem Relation Age of Onset  . Cancer Other   . Stroke Other    Family Psychiatric  History: Please see H&P.  Social History: Please see H&P.  Social History   Substance and Sexual Activity  Alcohol Use Yes   Comment: occasional     Social History   Substance and Sexual Activity  Drug Use No    Social History   Socioeconomic History  . Marital status: Single    Spouse name: None  . Number of children: None  . Years of  education: None  . Highest education level: None  Social Needs  . Financial resource strain: None  . Food insecurity - worry: None  . Food insecurity - inability: None  . Transportation needs - medical: None  . Transportation needs - non-medical: None  Occupational History  . None  Tobacco Use  . Smoking status: Current Every Day Smoker    Packs/day: 2.00    Types: Cigarettes  . Smokeless tobacco: Never Used  . Tobacco comment: refused  Substance and Sexual Activity  . Alcohol use: Yes    Comment: occasional  . Drug use: No  . Sexual activity: No  Other Topics Concern  . None  Social History Narrative  . None   Additional Social History:                         Sleep: 'some'.  Appetite:  Fair  Current Medications: Current Facility-Administered Medications  Medication Dose Route Frequency Provider Last Rate Last Dose  . acetaminophen (TYLENOL) tablet 650 mg  650 mg Oral Q6H PRN Ethelene Hal, NP      . alum & mag hydroxide-simeth (MAALOX/MYLANTA) 200-200-20 MG/5ML suspension 30 mL  30 mL Oral Q4H PRN Ethelene Hal, NP      . divalproex (DEPAKOTE ER) 24 hr tablet 1,000 mg  1,000 mg Oral QHS Ethelene Hal, NP   1,000 mg at 07/29/17 2015  .  LORazepam (ATIVAN) tablet 1 mg  1 mg Oral Q6H PRN Ursula Alert, MD       Or  . LORazepam (ATIVAN) injection 1 mg  1 mg Intramuscular Q6H PRN Manvir Thorson, MD      . LORazepam (ATIVAN) tablet 0.5 mg  0.5 mg Oral BID Reesha Debes, MD      . magnesium hydroxide (MILK OF MAGNESIA) suspension 30 mL  30 mL Oral Daily PRN Ethelene Hal, NP      . ondansetron [facility]) tablet 4 mg  4 mg Oral Q8H PRN Ethelene Hal, NP      . QUEtiapine (SEROQUEL) tablet 300 mg  300 mg Oral QHS Ethelene Hal, NP   300 mg at 07/29/17 2015  . tuberculin injection 5 Units  5 Units Intradermal Once Pennelope Bracken, MD   5 Units at 07/29/17 1634    Lab Results: No results found for this or any  previous visit (from the past 48 hour(s)).  Blood Alcohol level:  Lab Results  Component Value Date   ETH <10 07/24/2017   ETH <10 75/91/6384    Metabolic Disorder Labs: Lab Results  Component Value Date   HGBA1C 5.6 07/27/2017   MPG 114.02 07/27/2017   Lab Results  Component Value Date   PROLACTIN 4.8 07/27/2017   Lab Results  Component Value Date   CHOL 105 07/27/2017   TRIG 99 07/27/2017   HDL 40 (L) 07/27/2017   CHOLHDL 2.6 07/27/2017   VLDL 20 07/27/2017   LDLCALC 45 07/27/2017    Physical Findings: AIMS: Facial and Oral Movements Muscles of Facial Expression: None, normal Lips and Perioral Area: None, normal Jaw: None, normal Tongue: None, normal,Extremity Movements Upper (arms, wrists, hands, fingers): None, normal Lower (legs, knees, ankles, toes): None, normal, Trunk Movements Neck, shoulders, hips: None, normal, Overall Severity Severity of abnormal movements (highest score from questions above): None, normal Incapacitation due to abnormal movements: None, normal Patient's awareness of abnormal movements (rate only patient's report): No Awareness, Dental Status Current problems with teeth and/or dentures?: Yes Does patient usually wear dentures?: No  CIWA:    COWS:     Musculoskeletal: Strength & Muscle Tone: within normal limits Gait & Station: normal Patient leans: N/A  Psychiatric Specialty Exam: Physical Exam  Nursing note and vitals reviewed.   Review of Systems  Psychiatric/Behavioral: Positive for depression. The patient is nervous/anxious.   All other systems reviewed and are negative.   Blood pressure 118/75, pulse 85, temperature 97.7 F (36.5 C), temperature source Oral, resp. rate 16, height 5\' 7"  (1.702 m), weight 71.7 kg (158 lb).Body mass index is 24.75 kg/m.  General Appearance: Casual  Eye Contact:  Fair  Speech:  Normal Rate  Volume:  Normal  Mood:  Dysphoric  Affect:  Constricted  Thought Process:  Goal Directed and  Descriptions of Associations: Intact  Orientation:  Full (Time, Place, and Person)  Thought Content:  Rumination  Suicidal Thoughts:  No  Homicidal Thoughts:  No  Memory:  Immediate;   Fair Recent;   Fair Remote;   Fair  Judgement:  Fair  Insight:  Fair  Psychomotor Activity:  Decreased  Concentration:  Concentration: Fair and Attention Span: Fair  Recall:  [employer] of Knowledge:  Fair  Language:  Fair  Akathisia:  No  Handed:  Right  AIMS (if indicated):   denies tremors, rigidity  Assets:  Desire for Improvement  ADL's:  Intact  Cognition:  WNL  Sleep:  Number of Hours: 6.75     Treatment Plan Summary: Erland is a 56 year old Caucasian male, schizoaffective disorder, presented with worsening psychosis, noncompliance on medications.  He is currently started on medication like Depakote and Seroquel and Ativan, tolerating it well.  Continue to make changes as noted below. Daily contact with patient to assess and evaluate symptoms and progress in treatment, Medication management and Plan see below   For psychosis Seroquel 300 mg p.o. Nightly  For mood lability Depakote ER 1000 mg p.o. Nightly Will order Depakote level- 08/02/2017.  For anxiety Will reduce Ativan to 0.5 mg p.o. twice daily to address his drowsiness during the day.  PPD given on 07/29/2017.  EKG reviewed QTC within normal limits.  We will continue to encourage patient to attend groups.  CSW to continue to work on disposition.       Ursula Alert, MD 07/30/2017, 10:57 AM

## 2017-07-30 NOTE — Progress Notes (Signed)
Patient ID: Franklin Ayers, male   DOB: 03-22-1961, 56 y.o.   MRN: [id number]  Pt currently presents with an irritable affect and behavior. Pt answers writers questions with yes/no responses. Pt reports good sleep with current medication regimen. Pt adheres to medication regimen.   Pt provided with medications per providers orders. Pt's labs and vitals were monitored throughout the night. Pt given a 1:1 about emotional and mental status. Pt supported and encouraged to express concerns and questions.   Pt's safety ensured with 15 minute and environmental checks. Pt currently denies SI/HI and A/V hallucinations. Pt verbally agrees to seek staff if SI/HI or A/VH occurs and to consult with staff before acting on any harmful thoughts. Will continue POC.

## 2017-07-31 MED ORDER — LORAZEPAM 0.5 MG PO TABS
0.2500 mg | ORAL_TABLET | Freq: Two times a day (BID) | ORAL | Status: DC
  Administered 2017-07-31 – 2017-08-02 (×4): 0.25 mg via ORAL
  Filled 2017-07-31 (×4): qty 1

## 2017-07-31 NOTE — Progress Notes (Signed)
Patient ID: Franklin Ayers, male   DOB: [birth year], 56 y.o.   MRN: [id number] PER STATE REGULATIONS 482.30  THIS CHART WAS REVIEWED FOR MEDICAL NECESSITY WITH RESPECT TO THE PATIENT'S ADMISSION/ DURATION OF STAY.  NEXT REVIEW DATE: 08/03/2017  Chauncy Lean, RN, BSN CASE MANAGER

## 2017-07-31 NOTE — Progress Notes (Signed)
Patient ID: Franklin Ayers, male   DOB: 1961-01-09, 56 y.o.   MRN: [id number]  Pt currently presents with a flat affect and less irritable behavior tonight. Pt denies any current concerns or questions. Pt remains in bed tonight, chooses not to attend group. Pt reports good sleep with current medication regimen.   Pt provided with medications per providers orders. Pt's labs and vitals were monitored throughout the night. Pt given a 1:1 about emotional and mental status. Pt supported and encouraged to express concerns and questions. Pt educated on medications.  Pt's safety ensured with 15 minute and environmental checks. Pt currently denies SI/HI and A/V hallucinations. Pt verbally agrees to seek staff if SI/HI or A/VH occurs and to consult with staff before acting on any harmful thoughts. Will continue POC.

## 2017-07-31 NOTE — [facility] Group Notes (Signed)
[facility] Mental Health Association Group Therapy  07/31/2017 , 1:42 PM    Type of Therapy:  Mental Health Association Presentation  Participation Level:  Active  Participation Quality:  Attentive  Affect:  Blunted  Cognitive:  Oriented  Insight:  Limited  Engagement in Therapy:  Engaged  Modes of Intervention:  Discussion, Education and Socialization  Summary of Progress/Problems:  Franklin Ayers  from [place] came to present her recovery story, encourage group  members to share something about their story, and present information about the MHA.  Was present initially-left after 20 minutes and did not return.  Roque Lias B 07/31/2017 , 1:42 PM

## 2017-07-31 NOTE — Progress Notes (Signed)
Patient ID: Franklin Ayers, male   DOB: 11/03/1960, 56 y.o.   MRN: [id number]  Pt blood pressure is 94/56, pulse is 102. Pt denies any current dizziness. Offered a cup of Gatorade, pt refused. States "No, I'm going to go to breakfast."

## 2017-07-31 NOTE — Progress Notes (Signed)
[facility] MD Progress Note  07/31/2017 12:21 PM Franklin Ayers  MRN:  [id number] Subjective:   Franklin Ayers is a 56 y/o M with history of schizoaffective disorder who was admitted with worsening symptoms of psychosis and inability to meet his basic needs for ADL's and hygiene. He was restarted on his outpatient medications including depakote, seroquel, and ativan. Today upon evaluation, pt reports he is doing "good." He is sleeping well and his appetite is good. His hygiene is adequate and pt has been participating in groups and the therapeutic milieu. He is tolerating his medications without difficulty or side effects. His only concern today is feeling slightly fatigued. We discussed that ativan can cause drowsiness, and pt was in agreement to reduce the dose. We discussed that SW team is looking into placement options as pt will not be able to return home, and pt verbalized good understanding. Pt agrees to continue his treatment regimen. He had no other questions, comments, or concerns.  Principal Problem: Schizoaffective disorder, bipolar type ([place]) Diagnosis:   Patient Active Problem List   Diagnosis Date Noted  . Schizoaffective disorder, bipolar type ([place]) [F25.0] 12/23/2016   Total Time spent with patient: 30 minutes  Past Psychiatric History: see H&P  Past Medical History:  Past Medical History:  Diagnosis Date  . Mental disorder   . Schizoaffective disorder [facility])     Past Surgical History:  Procedure Laterality Date  . TONSILLECTOMY     Family History:  Family History  Problem Relation Age of Onset  . Cancer Other   . Stroke Other    Family Psychiatric  History: see H&P Social History:  Social History   Substance and Sexual Activity  Alcohol Use Yes   Comment: occasional     Social History   Substance and Sexual Activity  Drug Use No    Social History   Socioeconomic History  . Marital status: Single    Spouse name: None  . Number of children: None  .  Years of education: None  . Highest education level: None  Social Needs  . Financial resource strain: None  . Food insecurity - worry: None  . Food insecurity - inability: None  . Transportation needs - medical: None  . Transportation needs - non-medical: None  Occupational History  . None  Tobacco Use  . Smoking status: Current Every Day Smoker    Packs/day: 2.00    Types: Cigarettes  . Smokeless tobacco: Never Used  . Tobacco comment: refused  Substance and Sexual Activity  . Alcohol use: Yes    Comment: occasional  . Drug use: No  . Sexual activity: No  Other Topics Concern  . None  Social History Narrative  . None   Additional Social History:                         Sleep: Good  Appetite:  Good  Current Medications: Current Facility-Administered Medications  Medication Dose Route Frequency Provider Last Rate Last Dose  . acetaminophen (TYLENOL) tablet 650 mg  650 mg Oral Q6H PRN Ethelene Hal, NP      . alum & mag hydroxide-simeth (MAALOX/MYLANTA) 200-200-20 MG/5ML suspension 30 mL  30 mL Oral Q4H PRN Ethelene Hal, NP      . divalproex (DEPAKOTE ER) 24 hr tablet 1,000 mg  1,000 mg Oral QHS Ethelene Hal, NP   1,000 mg at 07/30/17 2219  . LORazepam (ATIVAN) tablet 1 mg  1 mg  Oral Q6H PRN Ursula Alert, MD       Or  . LORazepam (ATIVAN) injection 1 mg  1 mg Intramuscular Q6H PRN Eappen, Saramma, MD      . LORazepam (ATIVAN) tablet 0.25 mg  0.25 mg Oral BID Maris Berger T, MD      . magnesium hydroxide (MILK OF MAGNESIA) suspension 30 mL  30 mL Oral Daily PRN Ethelene Hal, NP      . ondansetron [facility]) tablet 4 mg  4 mg Oral Q8H PRN Ethelene Hal, NP      . QUEtiapine (SEROQUEL) tablet 300 mg  300 mg Oral QHS Ethelene Hal, NP   300 mg at 07/30/17 2221  . tuberculin injection 5 Units  5 Units Intradermal Once Pennelope Bracken, MD   5 Units at 07/29/17 1634    Lab Results: No results  found for this or any previous visit (from the past 48 hour(s)).  Blood Alcohol level:  Lab Results  Component Value Date   ETH <10 07/24/2017   ETH <10 42/59/5638    Metabolic Disorder Labs: Lab Results  Component Value Date   HGBA1C 5.6 07/27/2017   MPG 114.02 07/27/2017   Lab Results  Component Value Date   PROLACTIN 4.8 07/27/2017   Lab Results  Component Value Date   CHOL 105 07/27/2017   TRIG 99 07/27/2017   HDL 40 (L) 07/27/2017   CHOLHDL 2.6 07/27/2017   VLDL 20 07/27/2017   LDLCALC 45 07/27/2017    Physical Findings: AIMS: Facial and Oral Movements Muscles of Facial Expression: None, normal Lips and Perioral Area: None, normal Jaw: None, normal Tongue: None, normal,Extremity Movements Upper (arms, wrists, hands, fingers): None, normal Lower (legs, knees, ankles, toes): None, normal, Trunk Movements Neck, shoulders, hips: None, normal, Overall Severity Severity of abnormal movements (highest score from questions above): None, normal Incapacitation due to abnormal movements: None, normal Patient's awareness of abnormal movements (rate only patient's report): No Awareness, Dental Status Current problems with teeth and/or dentures?: Yes Does patient usually wear dentures?: No  CIWA:    COWS:     Musculoskeletal: Strength & Muscle Tone: within normal limits Gait & Station: normal Patient leans: N/A  Psychiatric Specialty Exam: Physical Exam  Nursing note and vitals reviewed.   Review of Systems  Constitutional: Positive for malaise/fatigue. Negative for chills and fever.  Respiratory: Negative for cough.   Cardiovascular: Negative for chest pain.  Gastrointestinal: Negative for abdominal pain, heartburn, nausea and vomiting.  Neurological: Negative for dizziness.  Psychiatric/Behavioral: Negative for depression, hallucinations and suicidal ideas. The patient is not nervous/anxious.     Blood pressure (!) 94/56, pulse (!) 102, temperature 97.8 F  (36.6 C), resp. rate 16, height 5\' 7"  (1.702 m), weight 71.7 kg (158 lb).Body mass index is 24.75 kg/m.  General Appearance: Casual and Fairly Groomed  Eye Contact:  Good  Speech:  Clear and Coherent and Normal Rate  Volume:  Normal  Mood:  Euthymic  Affect:  Appropriate, Congruent and Flat  Thought Process:  Coherent and Goal Directed  Orientation:  Full (Time, Place, and Person)  Thought Content:  Logical  Suicidal Thoughts:  No  Homicidal Thoughts:  No  Memory:  Immediate;   Good Recent;   Good Remote;   Good  Judgement:  Fair  Insight:  Fair  Psychomotor Activity:  Normal  Concentration:  Concentration: Good  Recall:  Good  Fund of Knowledge:  Good  Language:  Good  Akathisia:  No  Handed:    AIMS (if indicated):     Assets:  Communication Skills Resilience Social Support Talents/Skills  ADL's:  Intact  Cognition:  WNL  Sleep:  Number of Hours: 6.75     Treatment Plan Summary: Daily contact with patient to assess and evaluate symptoms and progress in treatment and Medication management. Pt has shown improvement of psychotic symptoms since admission and reinitialization on psychotropic medications. His only complaint today is some fatigue and we discussed reducing dose of ativan. SW team is continuing to seek placement options for the patient as he has not been successful living at home with his mother and his mother does not feel safe with him returning home.  - Continue inpatient hospitalization - Schizoaffective disorder - Continue depakote ER 1000mg  qhs (depakote level scheduled for 08/02/17) - Continue seroquel 300mg  qhs - Change ativan 0.5mg  BID to ativan 0.25mg  BID - Encourage participation in groups and the therapeutic milieu - Discharge planning will be ongoing   Pennelope Bracken, MD 07/31/2017, 12:21 PM

## 2017-07-31 NOTE — Progress Notes (Signed)
Recreation Therapy Notes  Date: 07/31/17 Time: 1000 Location: 500 Hall Dayroom  Group Topic: Goal Setting  Goal Area(s) Addresses:  Patient will be able to identify at least 3 life goals.  Patient will be able to identify benefit of investing in life goals.  Patient will be able to identify benefit of setting life goals.   Intervention: Worksheet  Activity: Lobbyist.  Patients were to create goals they hope to accomplish within the next week, month, year and five years.  Patients were to also identify any obstacles that would prevent them from reaching their goals; what they need to do to achieve their goals and what they can start doing tomorrow to work on their goals.  Education:  Discharge Planning, Coping Skills, Life Goals   Education Outcome: Acknowledges Education/In Group Clarification Provided/Needs Additional Education  Clinical Observations: Pt did not attend group.   Victorino Sparrow, LRT/CTRS         Victorino Sparrow A 07/31/2017 12:16 PM

## 2017-07-31 NOTE — Progress Notes (Signed)
DAR NOTE: Patient presents with anxious affect and depressed mood.  Denies pain, auditory and visual hallucinations.  Described energy level as normal and concentration as good.  Rates depression at 0, hopelessness at 0, and anxiety at 0.  Maintained on routine safety checks.  Medications given as prescribed.  Support and encouragement offered as needed.  Attended group and participated.  States goal for today is "talk to nurses and aides."  Patient visible in milieu with minimal interaction with staff and peers.  Offered no complaint.

## 2017-07-31 NOTE — Progress Notes (Signed)
Group Note:                   Patient did not attend group this evening since he was asleep in his room.

## 2017-08-01 DIAGNOSIS — R5383 Other fatigue: Secondary | ICD-10-CM

## 2017-08-01 DIAGNOSIS — R5381 Other malaise: Secondary | ICD-10-CM

## 2017-08-01 MED ORDER — HYPROMELLOSE (GONIOSCOPIC) 2.5 % OP SOLN: 1.0000 [drp] | Freq: Three times a day (TID) | OPHTHALMIC | Status: DC | PRN

## 2017-08-01 MED ORDER — POLYVINYL ALCOHOL 1.4 % OP SOLN: 1.0000 [drp] | Freq: Three times a day (TID) | OPHTHALMIC | Status: DC | PRN

## 2017-08-01 NOTE — Tx Team (Signed)
Interdisciplinary Treatment and Diagnostic Plan Update  08/01/2017 Time of Session: 3:15 PM  Franklin Ayers MRN: [id number]  Principal Diagnosis: Schizoaffective disorder, bipolar type  Secondary Diagnoses: Principal Problem:   Schizoaffective disorder, bipolar type ([place])   Current Medications:  Current Facility-Administered Medications  Medication Dose Route Frequency Provider Last Rate Last Dose  . acetaminophen (TYLENOL) tablet 650 mg  650 mg Oral Q6H PRN Ethelene Hal, NP      . alum & mag hydroxide-simeth (MAALOX/MYLANTA) 200-200-20 MG/5ML suspension 30 mL  30 mL Oral Q4H PRN Ethelene Hal, NP      . divalproex (DEPAKOTE ER) 24 hr tablet 1,000 mg  1,000 mg Oral QHS Ethelene Hal, NP   1,000 mg at 07/31/17 2118  . LORazepam (ATIVAN) tablet 1 mg  1 mg Oral Q6H PRN Ursula Alert, MD       Or  . LORazepam (ATIVAN) injection 1 mg  1 mg Intramuscular Q6H PRN Eappen, Saramma, MD      . LORazepam (ATIVAN) tablet 0.25 mg  0.25 mg Oral BID Pennelope Bracken, MD   0.25 mg at 08/01/17 0814  . magnesium hydroxide (MILK OF MAGNESIA) suspension 30 mL  30 mL Oral Daily PRN Ethelene Hal, NP      . ondansetron [facility]) tablet 4 mg  4 mg Oral Q8H PRN Ethelene Hal, NP      . QUEtiapine (SEROQUEL) tablet 300 mg  300 mg Oral QHS Ethelene Hal, NP   300 mg at 07/31/17 2118    PTA Medications: Medications Prior to Admission  Medication Sig Dispense Refill Last Dose  . divalproex (DEPAKOTE ER) 500 MG 24 hr tablet Take 2 tablets (1,000 mg total) by mouth at bedtime. (Patient not taking: Reported on 07/17/2017) 60 tablet 0 Not Taking at Unknown time  . OLANZapine (ZYPREXA) 5 MG tablet Take 1 tablet (5 mg total) by mouth at bedtime. (Patient not taking: Reported on 07/17/2017) 30 tablet 0 Not Taking at Unknown time  . QUEtiapine (SEROQUEL) 100 MG tablet Take 1 tablet (100 mg total) by mouth 3 (three) times daily. (Patient not taking: Reported on  07/17/2017) 90 tablet 0 Not Taking at Unknown time    Treatment Modalities: Medication Management, Group therapy, Case management,  1 to 1 session with clinician, Psychoeducation, Recreational therapy.  Patient Stressors: Marital or family conflict Medication change or noncompliance  Patient Strengths: Ability for insight Average or above average intelligence Curator fund of knowledge   Physician Treatment Plan for Primary Diagnosis: Schizoaffective disorder, bipolar type Long Term Goal(s): Improvement in symptoms so as ready for discharge  Short Term Goals: Ability to demonstrate self-control will improve Ability to identify changes in lifestyle to reduce recurrence of condition will improve Compliance with prescribed medications will improve Ability to identify triggers associated with substance abuse/mental health issues will improve  Medication Management: Evaluate patient's response, side effects, and tolerance of medication regimen.  Therapeutic Interventions: 1 to 1 sessions, Unit Group sessions and Medication administration.  Evaluation of Outcomes: Progressing   08/01/17  - Schizoaffective disorder - Continue depakote ER 1000mg  qhs (depakote level scheduled for 08/02/17) - Continue seroquel 300mg  qhs - Change ativan 0.5mg  BID to ativan 0.25mg  BID Pt to be visited by [facility] today for possible placement    Physician Treatment Plan for Secondary Diagnosis: Principal Problem:   Schizoaffective disorder, bipolar type ([place])  Long Term Goal(s): Improvement in symptoms so as ready for discharge  Short Term Goals: Ability to  demonstrate self-control will improve Ability to identify changes in lifestyle to reduce recurrence of condition will improve Compliance with prescribed medications will improve Ability to identify triggers associated with substance abuse/mental health issues will improve  Medication  Management: Evaluate patient's response, side effects, and tolerance of medication regimen.  Therapeutic Interventions: 1 to 1 sessions, Unit Group sessions and Medication administration.  Evaluation of Outcomes: Progressing   RN Treatment Plan for Primary Diagnosis: Schizoaffective disorder, bipolar type Long Term Goal(s): Knowledge of disease and therapeutic regimen to maintain health will improve  Short Term Goals: Ability to remain free from injury will improve, Ability to participate in decision making will improve, Ability to verbalize feelings will improve, Ability to disclose and discuss suicidal ideas, Ability to identify and develop effective coping behaviors will improve and Compliance with prescribed medications will improve  Medication Management: RN will administer medications as ordered by provider, will assess and evaluate patient's response and provide education to patient for prescribed medication. RN will report any adverse and/or side effects to prescribing provider.  Therapeutic Interventions: 1 on 1 counseling sessions, Psychoeducation, Medication administration, Evaluate responses to treatment, Monitor vital signs and CBGs as ordered, Perform/monitor CIWA, COWS, AIMS and Fall Risk screenings as ordered, Perform wound care treatments as ordered.  Evaluation of Outcomes: Progressing   LCSW Treatment Plan for Primary Diagnosis: Schizoaffective disorder, bipolar type Long Term Goal(s): Safe transition to appropriate next level of care at discharge, Engage patient in therapeutic group addressing interpersonal concerns.  Short Term Goals: Engage patient in aftercare planning with referrals and resources, Increase emotional regulation, Facilitate acceptance of mental health diagnosis and concerns, Identify triggers associated with mental health/substance abuse issues and Increase skills for wellness and recovery  Therapeutic Interventions: Assess for all discharge needs, 1 to  1 time with Social worker, Explore available resources and support systems, Assess for adequacy in community support network, Educate family and significant other(s) on suicide prevention, Complete Psychosocial Assessment, Interpersonal group therapy.  Evaluation of Outcomes: Progressing   Progress in Treatment: Attending groups: No Participating in groups: No Taking medication as prescribed: Yes Toleration of medication: Yes, no side effects reported at this time Family/Significant other contact made: Yes, Guinn Delarosa [phone] Patient understands diagnosis: No, limited insight  Discussing patient identified problems/goals with staff: Yes Medical problems stabilized or resolved: Yes Denies suicidal/homicidal ideation: Yes Issues/concerns per patient self-inventory: None Other: N/A  New problem(s) identified: None identified at this time.   New Short Term/Long Term Goal(s): "To get back on medication and into an assisted living facility"   Discharge Plan or Barriers: Upon discharge his mother would like him to go to an assisted living facility   Reason for Continuation of Hospitalization: Depression  Medication stabilization    Estimated Length of Stay: 11/23  Attendees: Patient:  08/01/2017  3:15 PM  Physician: Maris Berger, MD 08/01/2017  3:15 PM  Nursing: Jonette Mate, RN 08/01/2017  3:15 PM  RN Care Manager: Lars Pinks, RN 08/01/2017  3:15 PM  Social Worker: Ripley Fraise, [place]; Verdis Frederickson, Social Work Intern 08/01/2017  3:15 PM  Recreational Therapist: Victorino Sparrow, LRT 08/01/2017  3:15 PM  Other: Norberto Sorenson, [place] 08/01/2017  3:15 PM  Other:  08/01/2017  3:15 PM  Other: 08/01/2017  3:15 PM    Scribe for Treatment Team: Trish Mage, LCSW 08/01/2017 3:15 PM

## 2017-08-01 NOTE — Progress Notes (Signed)
DAR NOTE: Pt present with flat affect and depressed mood in the unit. Pt has been isolating himself and has been bed most of the time. Pt denies physical pain, took all his meds as scheduled. Pt mom visited and some one from group home visited too. Pt reported good night sleep, good appetite, normal energy, and good concentration. Pt rate depression at 2, hopeless ness at 0, and and anxiety at 4. Pt's safety ensured with 15 minute and environmental checks. Pt currently denies SI/HI and A/V hallucinations. Pt verbally agrees to seek staff if SI/HI or A/VH occurs and to consult with staff before acting on these thoughts. Will continue POC.

## 2017-08-01 NOTE — Progress Notes (Signed)
Patient ID: Franklin Ayers, male   DOB: [birth year], 56 y.o.   MRN: [id number]  Pt currently presents with a flat affect and guarded behavior. Pt reports to writer that his day was a good one today. Pt forwards little to Probation officer. Chooses not to attend group, in bed for most of the night. Pt reports good sleep with current medication regimen.   Pt provided with medications per providers orders. Pt's labs and vitals were monitored throughout the night. Pt given a 1:1 about emotional and mental status. Pt supported and encouraged to express concerns and questions.   Pt's safety ensured with 15 minute and environmental checks. Pt currently denies SI/HI and A/V hallucinations. Pt verbally agrees to seek staff if SI/HI or A/VH occurs and to consult with staff before acting on any harmful thoughts. Will continue POC.

## 2017-08-01 NOTE — Progress Notes (Signed)
[facility] MD Progress Note  08/01/2017 3:08 PM Franklin Ayers  MRN:  [id number]  Subjective: Franklin Ayers reports, "My mood is good & I'm trying to hang in there. I'm not depressed at all, my teeth are bothering me. The were broken off, so I had them extracted".    Objective: Franklin Ayers is a 56 y/o M with history of schizoaffective disorder who was admitted with worsening symptoms of psychosis and inability to meet his basic needs for ADL's and hygiene. He was restarted on his outpatient medications including depakote, seroquel, and ativan. Today upon evaluation, pt reports he is doing well, mood is good, does not feel depressed today. However, he says his teeth are bothering. He says he is sleeping well and his appetite is good. His hygiene is adequate and pt has been participating in groups and the therapeutic milieu. He is tolerating his medications without difficulty or side effects. His only concern today is dry mucus membrane, eyes, nasal & mouth areas. Will initiate artificial tears & encourage fluid intake. A discussion had already taken place that the SW team is looking into placement options as pt will not be able to return home, and pt verbalized good understanding. Pt agrees to continue his treatment regimen. He had no other questions, comments, or concerns.  Principal Problem: Schizoaffective disorder, bipolar type ([place]) Diagnosis:   Patient Active Problem List   Diagnosis Date Noted  . Schizoaffective disorder, bipolar type ([place]) [F25.0] 12/23/2016   Total Time spent with patient: 15 minutes  Past Psychiatric History: See H&P  Past Medical History:  Past Medical History:  Diagnosis Date  . Mental disorder   . Schizoaffective disorder [facility])     Past Surgical History:  Procedure Laterality Date  . TONSILLECTOMY     Family History:  Family History  Problem Relation Age of Onset  . Cancer Other   . Stroke Other    Family Psychiatric  History: See H&P  Social History:   Social History   Substance and Sexual Activity  Alcohol Use Yes   Comment: occasional     Social History   Substance and Sexual Activity  Drug Use No    Social History   Socioeconomic History  . Marital status: Single    Spouse name: None  . Number of children: None  . Years of education: None  . Highest education level: None  Social Needs  . Financial resource strain: None  . Food insecurity - worry: None  . Food insecurity - inability: None  . Transportation needs - medical: None  . Transportation needs - non-medical: None  Occupational History  . None  Tobacco Use  . Smoking status: Current Every Day Smoker    Packs/day: 2.00    Types: Cigarettes  . Smokeless tobacco: Never Used  . Tobacco comment: refused  Substance and Sexual Activity  . Alcohol use: Yes    Comment: occasional  . Drug use: No  . Sexual activity: No  Other Topics Concern  . None  Social History Narrative  . None   Additional Social History:   Sleep: Good  Appetite:  Good  Current Medications: Current Facility-Administered Medications  Medication Dose Route Frequency Provider Last Rate Last Dose  . acetaminophen (TYLENOL) tablet 650 mg  650 mg Oral Q6H PRN Ethelene Hal, NP      . alum & mag hydroxide-simeth (MAALOX/MYLANTA) 200-200-20 MG/5ML suspension 30 mL  30 mL Oral Q4H PRN Ethelene Hal, NP      . divalproex (  DEPAKOTE ER) 24 hr tablet 1,000 mg  1,000 mg Oral QHS Ethelene Hal, NP   1,000 mg at 07/31/17 2118  . LORazepam (ATIVAN) tablet 1 mg  1 mg Oral Q6H PRN Ursula Alert, MD       Or  . LORazepam (ATIVAN) injection 1 mg  1 mg Intramuscular Q6H PRN Eappen, Saramma, MD      . LORazepam (ATIVAN) tablet 0.25 mg  0.25 mg Oral BID Pennelope Bracken, MD   0.25 mg at 08/01/17 0814  . magnesium hydroxide (MILK OF MAGNESIA) suspension 30 mL  30 mL Oral Daily PRN Ethelene Hal, NP      . ondansetron [facility]) tablet 4 mg  4 mg Oral Q8H PRN Ethelene Hal, NP      . QUEtiapine (SEROQUEL) tablet 300 mg  300 mg Oral QHS Ethelene Hal, NP   300 mg at 07/31/17 2118    Lab Results: No results found for this or any previous visit (from the past 48 hour(s)).  Blood Alcohol level:  Lab Results  Component Value Date   ETH <10 07/24/2017   ETH <10 32/67/1245    Metabolic Disorder Labs: Lab Results  Component Value Date   HGBA1C 5.6 07/27/2017   MPG 114.02 07/27/2017   Lab Results  Component Value Date   PROLACTIN 4.8 07/27/2017   Lab Results  Component Value Date   CHOL 105 07/27/2017   TRIG 99 07/27/2017   HDL 40 (L) 07/27/2017   CHOLHDL 2.6 07/27/2017   VLDL 20 07/27/2017   LDLCALC 45 07/27/2017    Physical Findings: AIMS: Facial and Oral Movements Muscles of Facial Expression: None, normal Lips and Perioral Area: None, normal Jaw: None, normal Tongue: None, normal,Extremity Movements Upper (arms, wrists, hands, fingers): None, normal Lower (legs, knees, ankles, toes): None, normal, Trunk Movements Neck, shoulders, hips: None, normal, Overall Severity Severity of abnormal movements (highest score from questions above): None, normal Incapacitation due to abnormal movements: None, normal Patient's awareness of abnormal movements (rate only patient's report): No Awareness, Dental Status Current problems with teeth and/or dentures?: Yes Does patient usually wear dentures?: No  CIWA:    COWS:     Musculoskeletal: Strength & Muscle Tone: within normal limits Gait & Station: normal Patient leans: N/A  Psychiatric Specialty Exam: Physical Exam  Nursing note and vitals reviewed.   Review of Systems  Constitutional: Positive for malaise/fatigue. Negative for chills and fever.  Respiratory: Negative for cough.   Cardiovascular: Negative for chest pain.  Gastrointestinal: Negative for abdominal pain, heartburn, nausea and vomiting.  Neurological: Negative for dizziness.  Psychiatric/Behavioral:  Negative for depression, hallucinations and suicidal ideas. The patient is not nervous/anxious.     Blood pressure 120/64, pulse 61, temperature 97.8 F (36.6 C), resp. rate 16, height 5\' 7"  (1.702 m), weight 71.7 kg (158 lb).Body mass index is 24.75 kg/m.  General Appearance: Casual and Fairly Groomed  Eye Contact:  Good  Speech:  Clear and Coherent and Normal Rate  Volume:  Normal  Mood:  Euthymic  Affect:  Appropriate, Congruent and Flat  Thought Process:  Coherent and Goal Directed  Orientation:  Full (Time, Place, and Person)  Thought Content:  Logical  Suicidal Thoughts:  No  Homicidal Thoughts:  No  Memory:  Immediate;   Good Recent;   Good Remote;   Good  Judgement:  Fair  Insight:  Fair  Psychomotor Activity:  Normal  Concentration:  Concentration: Good  Recall:  [place]  of Knowledge:  Good  Language:  Good  Akathisia:  No  Handed:    AIMS (if indicated):     Assets:  Communication Skills Resilience Social Support Talents/Skills  ADL's:  Intact  Cognition:  WNL  Sleep:  Number of Hours: 6.75   Treatment Plan Summary: Daily contact with patient to assess and evaluate symptoms and progress in treatment and Medication management. Pt has shown improvement of psychotic symptoms since admission and reinitialization on psychotropic medications. His only complaint today is some dryness to mucus membranes. SW team is continuing to seek placement options for the patient as he has not been successful living at home with his mother and his mother does not feel safe with him returning home.  Will continue today 08/01/2017 plan as below except where it is noted.  - Continue inpatient hospitalization - Schizoaffective disorder - Continue depakote ER 1000mg  qhs (depakote level scheduled for 08/02/17) - Continue seroquel 300mg  qhs - Continue ativan 0.25mg  BID - Encourage participation in groups and the therapeutic milieu - Discharge  planning will be ongoing. -   Dry eyes:              -Will initiate artificial eyes prn.  Encarnacion Slates, NP, PMHNP, FNP-BC. 08/01/2017, 3:08 PMPatient ID: Franklin Ayers, male   DOB: 07/14/1961, 56 y.o.   MRN: [id number]

## 2017-08-01 NOTE — [facility] Group Notes (Signed)
LCSW Group Therapy Note   08/01/2017 1:15pm   Type of Therapy and Topic:  Group Therapy:  Overcoming Obstacles   Participation Level:  Active   Description of Group:    In this group patients will be encouraged to explore what they see as obstacles to their own wellness and recovery. They will be guided to discuss their thoughts, feelings, and behaviors related to these obstacles. The group will process together ways to cope with barriers, with attention given to specific choices patients can make. Each patient will be challenged to identify changes they are motivated to make in order to overcome their obstacles. This group will be process-oriented, with patients participating in exploration of their own experiences as well as giving and receiving support and challenge from other group members.   Therapeutic Goals: 1. Patient will identify personal and current obstacles as they relate to admission. 2. Patient will identify barriers that currently interfere with their wellness or overcoming obstacles.  3. Patient will identify feelings, thought process and behaviors related to these barriers. 4. Patient will identify two changes they are willing to make to overcome these obstacles:      Summary of Patient Progress   Stayed the entire time, engaged throughout.  "I have decided I need to go back to an ALF because my mother is old.  She can't drive me to my appointments anymore.  Mood good, grounded in reality.    Therapeutic Modalities:   Cognitive Behavioral Therapy Solution Focused Therapy Motivational Interviewing Relapse Prevention Therapy  Trish Mage, LCSW 08/01/2017 11:48 AM

## 2017-08-01 NOTE — Progress Notes (Signed)
Recreation Therapy Notes  Date: 08/01/17 Time: 1000 Location: 500 Hall Dayroom  Group Topic: Coping Skills  Goal Area(s) Addresses:  Patient will be able to identify positive coping skills. Patient will be able to identify benefits of positive coping skills. Patient will be able to identify benefits of using coping skills post d/c.  Intervention: Worksheet, scissors, glue sticks, magazines  Activity: Coping Skills.  Patients were given a worksheet that was broken down into 5 categories (diversions, social, cognitive, tension releaser and physical).  Patients were to identify picture from the magazines that represent at least 2 coping skills they would use for each category.   Education: Radiographer, therapeutic, Dentist.   Education Outcome: Acknowledges understanding/In group clarification offered/Needs additional education.   Clinical Observations/Feedback: Pt did not attend group.   Victorino Sparrow, LRT/CTRS         Victorino Sparrow A 08/01/2017 12:52 PM

## 2017-08-01 NOTE — Progress Notes (Signed)
Pt did not attend wrap-up group   

## 2017-08-02 LAB — VALPROIC ACID LEVEL: Valproic Acid Lvl: 86 ug/mL (ref 50.0–100.0)

## 2017-08-02 NOTE — Plan of Care (Signed)
Pt denies any current SI or HI to Probation officer

## 2017-08-02 NOTE — [facility] Group Notes (Signed)
Type of Therapy and Topic:  Group Therapy:  Reslilence   Participation Level:  Did Not Attend   Description of Group:    In this group patients will be encouraged to explore what they see as obstacles to their own wellness and recovery. They will be guided to discuss their thoughts, feelings, and behaviors related to these obstacles. The group will process together ways to cope with barriers, with attention given to specific choices patients can make. Each patient will be challenged to identify changes they are motivated to make in order to overcome their obstacles. This group will be process-oriented, with patients participating in exploration of their own experiences as well as giving and receiving support and challenge from other group members.   Therapeutic Goals: 1. Patient will identify personal and current obstacles as they relate to admission. 2. Patient will identify barriers that currently interfere with their wellness or overcoming obstacles.  3. Patient will identify feelings, thought process and behaviors related to these barriers. 4. Patient will identify two changes they are willing to make to overcome these obstacles:      Summary of Patient Progress      Therapeutic Modalities:   Cognitive Behavioral Therapy Solution Focused Therapy Motivational Interviewing Relapse Prevention Therapy    Darin Engels MSW, [facility] 08/02/2017 2:41 PM

## 2017-08-02 NOTE — Progress Notes (Signed)
DAR NOTE: Patient presents with anxious affect and depressed mood.  Denies pain, auditory and visual hallucinations.  Rates depression at 0, hopelessness at 0, and anxiety at 0.  Maintained on routine safety checks.  Medications given as prescribed.  Support and encouragement offered as needed.  Pt did not attend the group, has been bed most of the time. Offered no complaint.

## 2017-08-02 NOTE — Progress Notes (Signed)
Franklin Ayers had been in room and in bed for much of the evening. He did not attend evening activity. He spoke about feeling tired today and wanting to get some rest. He was able to receive bedtime medications without incident and did not verbalize any complaints of pain. A. Support provided. R. Safety maintained

## 2017-08-02 NOTE — Progress Notes (Signed)
Recreation Therapy Notes  Date: 08/02/17 Time: 1000 Location: 500 Hall Dayroom  Group Topic: Self-Expression  Goal Area(s) Addresses:  Patient will successfully identify the things they are thankful for. Patient will successfully identify benefit of expressing themselves in a healthy manner.  Intervention: Paper, markers  Activity: What I'm Thankful For.  Patients were given a blank sheet of paper outlined with a picture frame.  Patients were to identify the things they are thankful for.  Patients could express their themselves through poem, song, or any appropriate way they choose.   Education:  Self-Expression, Dentist.   Education Outcome: Acknowledges education/In group clarification offered/Needs additional education  Clinical Observations/Feedback: Pt did not attend group.     Victorino Sparrow, LRT/CTRS         Ria Comment, Lissa Rowles A 08/02/2017 11:59 AM

## 2017-08-02 NOTE — Progress Notes (Signed)
[facility] MD Progress Note  08/02/2017 3:26 PM Franklin Ayers  MRN:  [id number] Subjective:   Franklin Ayers is a 56 y/o M with history of schizoaffective bipolar type who was admitted with worsening psychosis, inability to meet his ADL's, and SI to starve himself. Pt was restarted on psychotropic medications of depakote and seroquel, and he has shown improvement of his mood symptoms since admission. He has been eating well and maintaining his ADL's. Today upon evaluation, he reports that he is doing well overall. He is sleeping well and appetite is good. He denies SI/HI/AH/VH. He is tolerating his medications without difficulty, but he notes that he feels slightly drowsy after taking ativan. We discussed discontinuing it and pt was in agreement. Pt asked about his planned discharge date, and we discussed that tentatively he will discharge back to home on Friday 08/04/17 as there are no available group home available for immediate discharge. SW team has been in contact with patient's mother who is in agreement with this plan. Pt verbalized good understanding, and he had no further questions, comments, or concerns.  Principal Problem: Schizoaffective disorder, bipolar type ([place]) Diagnosis:   Patient Active Problem List   Diagnosis Date Noted  . Schizoaffective disorder, bipolar type ([place]) [F25.0] 12/23/2016   Total Time spent with patient: 30 minutes  Past Psychiatric History: see H&P  Past Medical History:  Past Medical History:  Diagnosis Date  . Mental disorder   . Schizoaffective disorder [facility])     Past Surgical History:  Procedure Laterality Date  . TONSILLECTOMY     Family History:  Family History  Problem Relation Age of Onset  . Cancer Other   . Stroke Other    Family Psychiatric  History: see H&P Social History:  Social History   Substance and Sexual Activity  Alcohol Use Yes   Comment: occasional     Social History   Substance and Sexual Activity  Drug Use No     Social History   Socioeconomic History  . Marital status: Single    Spouse name: None  . Number of children: None  . Years of education: None  . Highest education level: None  Social Needs  . Financial resource strain: None  . Food insecurity - worry: None  . Food insecurity - inability: None  . Transportation needs - medical: None  . Transportation needs - non-medical: None  Occupational History  . None  Tobacco Use  . Smoking status: Current Every Day Smoker    Packs/day: 2.00    Types: Cigarettes  . Smokeless tobacco: Never Used  . Tobacco comment: refused  Substance and Sexual Activity  . Alcohol use: Yes    Comment: occasional  . Drug use: No  . Sexual activity: No  Other Topics Concern  . None  Social History Narrative  . None   Additional Social History:                         Sleep: Good  Appetite:  Good  Current Medications: Current Facility-Administered Medications  Medication Dose Route Frequency Provider Last Rate Last Dose  . acetaminophen (TYLENOL) tablet 650 mg  650 mg Oral Q6H PRN Ethelene Hal, NP      . alum & mag hydroxide-simeth (MAALOX/MYLANTA) 200-200-20 MG/5ML suspension 30 mL  30 mL Oral Q4H PRN Ethelene Hal, NP      . divalproex (DEPAKOTE ER) 24 hr tablet 1,000 mg  1,000 mg Oral QHS Romilda Garret,  Billey Chang, NP   1,000 mg at 08/01/17 2034  . LORazepam (ATIVAN) tablet 1 mg  1 mg Oral Q6H PRN Ursula Alert, MD       Or  . LORazepam (ATIVAN) injection 1 mg  1 mg Intramuscular Q6H PRN Eappen, Saramma, MD      . magnesium hydroxide (MILK OF MAGNESIA) suspension 30 mL  30 mL Oral Daily PRN Ethelene Hal, NP      . ondansetron [facility]) tablet 4 mg  4 mg Oral Q8H PRN Ethelene Hal, NP      . polyvinyl alcohol (LIQUIFILM TEARS) 1.4 % ophthalmic solution 1 drop  1 drop Left Eye TID PRN Cobos, Myer Peer, MD      . QUEtiapine (SEROQUEL) tablet 300 mg  300 mg Oral QHS Ethelene Hal, NP   300 mg at  08/01/17 2035    Lab Results:  Results for orders placed or performed during the hospital encounter of 07/26/17 (from the past 48 hour(s))  Valproic acid level     Status: None   Collection Time: 08/02/17  6:45 AM  Result Value Ref Range   Valproic Acid Lvl 86 50.0 - 100.0 ug/mL    Comment: Performed at [facility], [place] [address] [place], [place] [address]    Blood Alcohol level:  Lab Results  Component Value Date   [facility] <10 07/24/2017   ETH <10 76/16/0737    Metabolic Disorder Labs: Lab Results  Component Value Date   HGBA1C 5.6 07/27/2017   MPG 114.02 07/27/2017   Lab Results  Component Value Date   PROLACTIN 4.8 07/27/2017   Lab Results  Component Value Date   CHOL 105 07/27/2017   TRIG 99 07/27/2017   HDL 40 (L) 07/27/2017   CHOLHDL 2.6 07/27/2017   VLDL 20 07/27/2017   LDLCALC 45 07/27/2017    Physical Findings: AIMS: Facial and Oral Movements Muscles of Facial Expression: None, normal Lips and Perioral Area: None, normal Jaw: None, normal Tongue: None, normal,Extremity Movements Upper (arms, wrists, hands, fingers): None, normal Lower (legs, knees, ankles, toes): None, normal, Trunk Movements Neck, shoulders, hips: None, normal, Overall Severity Severity of abnormal movements (highest score from questions above): None, normal Incapacitation due to abnormal movements: None, normal Patient's awareness of abnormal movements (rate only patient's report): No Awareness, Dental Status Current problems with teeth and/or dentures?: Yes Does patient usually wear dentures?: No  CIWA:    COWS:     Musculoskeletal: Strength & Muscle Tone: within normal limits Gait & Station: normal Patient leans: N/A  Psychiatric Specialty Exam: Physical Exam  Nursing note and vitals reviewed.   Review of Systems  Constitutional: Negative for chills and fever.  Respiratory: Negative for cough and shortness of breath.   Cardiovascular: Negative for  chest pain and palpitations.  Gastrointestinal: Negative for abdominal pain, heartburn, nausea and vomiting.  Psychiatric/Behavioral: Negative for depression, hallucinations and suicidal ideas.    Blood pressure (!) 87/62, pulse 85, temperature 97.6 F (36.4 C), temperature source Oral, resp. rate 16, height 5\' 7"  (1.702 m), weight 71.7 kg (158 lb).Body mass index is 24.75 kg/m.  General Appearance: Casual  Eye Contact:  Good  Speech:  Clear and Coherent and Normal Rate  Volume:  Normal  Mood:  Euthymic  Affect:  Congruent and Flat  Thought Process:  Coherent and Goal Directed  Orientation:  Full (Time, Place, and Person)  Thought Content:  Logical  Suicidal Thoughts:  No  Homicidal Thoughts:  No  Memory:  Immediate;   Good Recent;   Good Remote;   Good  Judgement:  Fair  Insight:  Fair  Psychomotor Activity:  Normal  Concentration:  Concentration: Fair  Recall:  [place] of Knowledge:  Fair  Language:  Fair  Akathisia:  No  Handed:    AIMS (if indicated):     Assets:  Armed forces logistics/support/administrative officer Housing Physical Health Resilience Social Support  ADL's:  Intact  Cognition:  WNL  Sleep:  Number of Hours: 6.75     Treatment Plan Summary: Daily contact with patient to assess and evaluate symptoms and progress in treatment and Medication management. Pt has stability of his mood and psychotic symptoms. He is meeting his own ADL's and his food intake his good. He is taking his medications without difficulty or side effect. We will anticipate discharge to his mother's home on 08/04/17.  -Continue inpatient hospitalization - Schizoaffective disorder - Continue depakote ER 1000mg  qhs (depakote level  86 on 08/02/17) - Continue seroquel 300mg  qhs - Discontinue ativan 0.25mg  BID - Encourage participation in groups and the therapeutic milieu - Discharge planning will be ongoing.   Pennelope Bracken, MD 08/02/2017, 3:26 PM

## 2017-08-02 NOTE — Progress Notes (Signed)
Group Note:   The patient did not attend since he was asleep in his bedroom.

## 2017-08-03 MED ORDER — DIVALPROEX SODIUM ER 500 MG PO TB24: 1000.0000 mg | ORAL_TABLET | Freq: Every day | ORAL | 0 refills | Status: DC

## 2017-08-03 MED ORDER — QUETIAPINE FUMARATE 300 MG PO TABS: 300.0000 mg | ORAL_TABLET | Freq: Every day | ORAL | 0 refills | Status: DC

## 2017-08-03 NOTE — Progress Notes (Signed)
Discharge Note:  Patient discharged home with mother.  Patient denied SI and HI.  Denied A/V hallucinations.  Suicide prevention information given and discussed with patient who stated he understood and had no questions.  Patient stated he received all his belongings, clothing, toiletries, misc items, prescriptions, etc.  Patient and his mother stated they appreciated all assistance received from [facility] staff.  All required discharge information given to patient at discharge.

## 2017-08-03 NOTE — Progress Notes (Signed)
Patient ID: Franklin Ayers, male   DOB: 03-05-1961, 56 y.o.   MRN: [id number]  DAR: Writer approached patient this morning as he was laying in his bed. His affect was blunted at this time. Pt. Denies SI/HI and A/V Hallucinations. He reports that he is feeling well today. Patient does not report any pain or discomfort at this time. Support and encouragement provided to the patient. Patient is minimal with staff at this time. Q15 minute checks are maintained for safety.

## 2017-08-03 NOTE — Discharge Summary (Signed)
Physician Discharge Summary Note  Patient:  Franklin Ayers is an 56 y.o., male MRN:  [id number] DOB:  10-07-1960 Patient phone:  [phone] (home)  Patient address:   [place] [address],  Total Time spent with patient: 20 minutes  Date of Admission:  07/26/2017 Date of Discharge: 08/03/17  Reason for Admission:  Worsening psychosis  Principal Problem: Schizoaffective disorder, bipolar type [facility]) Discharge Diagnoses: Patient Active Problem List   Diagnosis Date Noted  . Schizoaffective disorder, bipolar type ([place]) [F25.0] 12/23/2016    Past Psychiatric History: Pt reports history of schizophrenia. He has multiple inpatient hospitalizations, but he is unable to name when his last hospital stay was. He has presented to ED twice in the last year with psychiatric complaints but he was discharged from ED. Pt has outpatient providers but he is unable to name them at time of interview    Past Medical History:  Past Medical History:  Diagnosis Date  . Mental disorder   . Schizoaffective disorder [facility])     Past Surgical History:  Procedure Laterality Date  . TONSILLECTOMY     Family History:  Family History  Problem Relation Age of Onset  . Cancer Other   . Stroke Other    Family Psychiatric  History: Denies Social History:  Social History   Substance and Sexual Activity  Alcohol Use Yes   Comment: occasional     Social History   Substance and Sexual Activity  Drug Use No    Social History   Socioeconomic History  . Marital status: Single    Spouse name: None  . Number of children: None  . Years of education: None  . Highest education level: None  Social Needs  . Financial resource strain: None  . Food insecurity - worry: None  . Food insecurity - inability: None  . Transportation needs - medical: None  . Transportation needs - non-medical: None  Occupational History  . None  Tobacco Use  . Smoking status: Current Every Day Smoker     Packs/day: 2.00    Types: Cigarettes  . Smokeless tobacco: Never Used  . Tobacco comment: refused  Substance and Sexual Activity  . Alcohol use: Yes    Comment: occasional  . Drug use: No  . Sexual activity: No  Other Topics Concern  . None  Social History Narrative  . None    Hospital Course:   07/27/17 [facility] MD Assessment:56 y/o M with history of schizoaffective disorder who was admitted with worsening symptoms of psychosis including not eating, not drinking, not talking, not taking medications, and not getting out of bed. Pt has recent relevant history of living in a group home for about 10 years and then recently moving back in with his mother. Once moved in with his mother, pt had worsening of his ADL's and recently he had not been eating, drinking, bathing, taking his medications, or leaving his bed. He was taken initially to the ED at which time he had improvement of his behaviors; he was observed eating, drinking, bathing, and he was adherent to the prescribed treatment regimen. He was transferred to [facility] for additional treatment and stabilization. Upon evaluation today, pt reports his reasons for coming to the hospital, stating, " My body has a low factor of some kind of vitamin thing in my blood." Pt was asked about concerns that he was not meeting his ADL's at home, and he acknowledges that he was not. He does not have a  reason or explanation why he was not eating/drinking, bathing, or taking his medications. He denies feeling depressed. He denies SI/HI/AH/VH. He notes some general anxiety, explaining, "I feel unbalanced." He denies symptoms of depression, mania, OCD, and PTSD. He denies illicit substance use.  Discussed with patient about treatment options. He reports feeling ambivalent about his medications, stating, "I'm not sure if they help." Pt was restarted on home medications of depakote and seroquel, and he reports that he is tolerating them without difficulty. He has some  insight into his presenting symptoms, stating, "Changing my medications won't help me eat - I just gotta do it." Pt agrees to follow recommendations and regulations while admitted at [facility]. He had no further questions, comments, or concerns.   Patient remained on the [facility] unit for 7 days and stabilized with medications and therapy. The patient was started on Depakote 1000 mg QHS, Seroquel 300 mg QHS. Patient showed improvement with improved sleep, mood, affect, sleep, and appetite. Patient has been seen interacting appropriately in the day room. Patient has been cooperative and taken medications. Patient will be discharged home with his mother. He agrees to follow up at [facility]. He is provided with prescriptions for his medications upon discharge. He denies any SI/HI/AVH and contracts for safety.    Physical Findings: AIMS: Facial and Oral Movements Muscles of Facial Expression: None, normal Lips and Perioral Area: None, normal Jaw: None, normal Tongue: None, normal,Extremity Movements Upper (arms, wrists, hands, fingers): None, normal Lower (legs, knees, ankles, toes): None, normal, Trunk Movements Neck, shoulders, hips: None, normal, Overall Severity Severity of abnormal movements (highest score from questions above): None, normal Incapacitation due to abnormal movements: None, normal Patient's awareness of abnormal movements (rate only patient's report): No Awareness, Dental Status Current problems with teeth and/or dentures?: Yes Does patient usually wear dentures?: No  CIWA:    COWS:     Musculoskeletal: Strength & Muscle Tone: within normal limits Gait & Station: normal Patient leans: N/A  Psychiatric Specialty Exam: Physical Exam  Nursing note and vitals reviewed. Constitutional: He is oriented to person, place, and time. He appears well-developed and well-nourished.  Cardiovascular: Normal rate.  Respiratory: Effort normal.  Musculoskeletal: Normal range of motion.   Neurological: He is alert and oriented to person, place, and time.  Skin: Skin is warm.    Review of Systems  Constitutional: Negative.   HENT: Negative.   Eyes: Negative.   Respiratory: Negative.   Cardiovascular: Negative.   Gastrointestinal: Negative.   Genitourinary: Negative.   Musculoskeletal: Negative.   Skin: Negative.   Neurological: Negative.   Endo/Heme/Allergies: Negative.     Blood pressure 90/62, pulse 91, temperature 97.7 F (36.5 C), temperature source Oral, resp. rate 16, height 5\' 7"  (1.702 m), weight 71.7 kg (158 lb).Body mass index is 24.75 kg/m.  General Appearance: Casual  Eye Contact:  Good  Speech:  Clear and Coherent and Normal Rate  Volume:  Normal  Mood:  Euthymic  Affect:  Congruent  Thought Process:  Goal Directed and Descriptions of Associations: Intact  Orientation:  Full (Time, Place, and Person)  Thought Content:  WDL  Suicidal Thoughts:  No  Homicidal Thoughts:  No  Memory:  Immediate;   Good Recent;   Good Remote;   Good  Judgement:  Good  Insight:  Good  Psychomotor Activity:  Normal  Concentration:  Concentration: Good and Attention Span: Good  Recall:  Good  Fund of Knowledge:  Good  Language:  Good  Akathisia:  No  Handed:  Right  AIMS (if indicated):     Assets:  Communication Skills Desire for Improvement Financial Resources/Insurance Housing Social Support Transportation  ADL's:  Intact  Cognition:  WNL  Sleep:  Number of Hours: 6.75     Have you used any form of tobacco in the last 30 days? (Cigarettes, Smokeless Tobacco, Cigars, and/or Pipes): Yes  Has this patient used any form of tobacco in the last 30 days? (Cigarettes, Smokeless Tobacco, Cigars, and/or Pipes) Yes, Yes, A prescription for an FDA-approved tobacco cessation medication was offered at discharge and the patient refused  Blood Alcohol level:  Lab Results  Component Value Date   [facility] <10 07/24/2017   ETH <10 62/37/6283    Metabolic Disorder  Labs:  Lab Results  Component Value Date   HGBA1C 5.6 07/27/2017   MPG 114.02 07/27/2017   Lab Results  Component Value Date   PROLACTIN 4.8 07/27/2017   Lab Results  Component Value Date   CHOL 105 07/27/2017   TRIG 99 07/27/2017   HDL 40 (L) 07/27/2017   CHOLHDL 2.6 07/27/2017   VLDL 20 07/27/2017   LDLCALC 45 07/27/2017    See Psychiatric Specialty Exam and Suicide Risk Assessment completed by Attending Physician prior to discharge.  Discharge destination:  Home  Is patient on multiple antipsychotic therapies at discharge:  No   Has Patient had three or more failed trials of antipsychotic monotherapy by history:  No  Recommended Plan for Multiple Antipsychotic Therapies: NA   Allergies as of 08/03/2017      Reactions   Penicillins Shortness Of Breath   .Has patient had a PCN reaction causing immediate rash, facial/tongue/throat swelling, SOB or lightheadedness with hypotension: no Has patient had a PCN reaction causing severe rash involving mucus membranes or skin necrosis: no Has patient had a PCN reaction that required hospitalization: no Has patient had a PCN reaction occurring within the last 10 years: no If all of the above answers are "NO", then may proceed with Cephalosporin use.      Medication List    STOP taking these medications   OLANZapine 5 MG tablet Commonly known as:  ZYPREXA     TAKE these medications     Indication  divalproex 500 MG 24 hr tablet Commonly known as:  DEPAKOTE ER Take 2 tablets (1,000 mg total) by mouth at bedtime. For mood control What changed:  additional instructions  Indication:  Mixed Bipolar Affective Disorder   QUEtiapine 300 MG tablet Commonly known as:  SEROQUEL Take 1 tablet (300 mg total) by mouth at bedtime. For mood control What changed:    medication strength  how much to take  when to take this  additional instructions  Indication:  [facility] [place] Follow up.   Why:  Follow up with your ACT team the day after you d/c from the hospital Contact information: [place] [address] [place] [address] [phone]           Follow-up recommendations:  Continue activity as tolerated. Continue diet as recommended by your PCP. Ensure to keep all appointments with outpatient providers.  Comments:  Patient is instructed prior to discharge to: Take all medications as prescribed by his/her mental healthcare provider. Report any adverse effects and or reactions from the medicines to his/her outpatient provider promptly. Patient has been instructed & cautioned: To not engage in alcohol and or  illegal drug use while on prescription medicines. In the event of worsening symptoms, patient is instructed to call the crisis hotline, 911 and or go to the nearest ED for appropriate evaluation and treatment of symptoms. To follow-up with his/her primary care provider for your other medical issues, concerns and or health care needs.    Signed: [place], FNP 08/03/2017, 9:38 AM

## 2017-08-03 NOTE — Progress Notes (Signed)
Report received. Patient observed resting in bed. Respirations even and non labored. No distress noted. Monitoring continues.

## 2017-08-03 NOTE — BHH Suicide Risk Assessment (Signed)
[facility] Discharge Suicide Risk Assessment   Principal Problem: Schizoaffective disorder, bipolar type [facility]) Discharge Diagnoses:  Patient Active Problem List   Diagnosis Date Noted  . Schizoaffective disorder, bipolar type ([place]) [F25.0] 12/23/2016   Spent time discussing with patient.  He reports that he continues to feel calm, thoughts are more clear.  Denies any suicidal thoughts.He denies any auditory or visual hallucinations and does not present with any RTIS or paranoia.  Patient's mother has been contacted and would like to proceed with discharge one day earlier than anticipated, so that the patient can spend Thanksgiving with family.  Patient is agreeable to this, and mom will present today at 1 PM to pick the patient up.  Total Time spent with patient: 15 minutes  Musculoskeletal: Strength & Muscle Tone: within normal limits Gait & Station: normal Patient leans: N/A  Psychiatric Specialty Exam: Review of Systems  Constitutional: Negative.   HENT: Negative.   Eyes: Negative.   Respiratory: Negative.   Cardiovascular: Negative.   Gastrointestinal: Negative.   Musculoskeletal: Negative.   Neurological: Negative.   Psychiatric/Behavioral: Negative.     Blood pressure 90/62, pulse 91, temperature 97.7 F (36.5 C), temperature source Oral, resp. rate 16, height 5\' 7"  (1.702 m), weight 71.7 kg (158 lb).Body mass index is 24.75 kg/m.  General Appearance: Casual and Fairly Groomed  Engineer, water::  Fair  Speech:  Clear and Coherent409  Volume:  Normal  Mood:  Euthymic  Affect:  Congruent and Flat  Thought Process:  Coherent and Descriptions of Associations: Intact  Orientation:  Full (Time, Place, and Person)  Thought Content:  Logical and Computation  Suicidal Thoughts:  No  Homicidal Thoughts:  No  Memory:  Immediate;   Fair  Judgement:  Fair  Insight:  Shallow  Psychomotor Activity:  Normal  Concentration:  Fair  Recall:  [employer] of Knowledge:Fair  Language: Fair   Akathisia:  Negative  Handed:  Right  AIMS (if indicated):     Assets:  Communication Skills Desire for Improvement Housing Social Support  Sleep:  Number of Hours: 6.75  Cognition: WNL  ADL's:  Intact   Mental Status Per Nursing Assessment::   On Admission:     Demographic Factors:  Male, Caucasian, Low socioeconomic status and Unemployed  Loss Factors: Financial problems/change in socioeconomic status  Historical Factors: Prior suicide attempts, Family history of mental illness or substance abuse and Impulsivity  Risk Reduction Factors:   Living with another person, especially a relative, Positive social support and Positive coping skills or problem solving skills  Continued Clinical Symptoms:  More than one psychiatric diagnosis  Cognitive Features That Contribute To Risk:  None    Suicide Risk:  Mild:  Suicidal ideation of limited frequency, intensity, duration, and specificity.  There are no identifiable plans, no associated intent, mild dysphoria and related symptoms, good self-control (both objective and subjective assessment), few other risk factors, and identifiable protective factors, including available and accessible social support.  Follow-up Information    [facility] Follow up.   Why:  Follow up with your ACT team the day after you d/c from the hospital Contact information: [place] [address] [phone]           Plan Of Care/Follow-up recommendations:  Activity:  reume normal Diet:  heart healthy diet  Discharge home to live with mom  Aundra Dubin, MD 08/03/2017, 10:13 AM

## 2017-08-04 NOTE — Progress Notes (Signed)
  [facility] Adult Case Management Discharge Plan :  Will you be returning to the same living situation after discharge:  Yes,  home w mother At discharge, do you have transportation home?: Yes,  mother Do you have the ability to pay for your medications: Yes,  insured  Release of information consent forms completed and in the chart;  Patient's signature needed at discharge.  Patient to Follow up at: Follow-up Information    [facility] Follow up.   Why:  Follow up with your ACT team the day after you d/c from the hospital Contact information: [place] [address] [place] [address] [phone]           Next level of care provider has access to [place] and Suicide Prevention discussed: Yes,  w mother  Have you used any form of tobacco in the last 30 days? (Cigarettes, Smokeless Tobacco, Cigars, and/or Pipes): Yes  Has patient been referred to the Quitline?: Patient refused referral  Patient has been referred for addiction treatment: Yes outpatient provider will assess and treat as appropriate  Beverely Pace, LCSW 08/04/2017, 9:49 AM

## 2017-08-08 ENCOUNTER — Emergency Department (HOSPITAL_COMMUNITY): Payer: Medicare Other

## 2017-08-08 ENCOUNTER — Encounter (HOSPITAL_COMMUNITY): Payer: Self-pay | Admitting: Emergency Medicine

## 2017-08-08 ENCOUNTER — Emergency Department (HOSPITAL_COMMUNITY)
Admission: EM | Admit: 2017-08-08 | Discharge: 2017-08-09 | Disposition: A | Discharge status: Home / Self Care | Payer: Medicare Other | Attending: Emergency Medicine | Admitting: Emergency Medicine

## 2017-08-08 DIAGNOSIS — R630 Anorexia: Secondary | ICD-10-CM | POA: Diagnosis present

## 2017-08-08 DIAGNOSIS — Z79899 Other long term (current) drug therapy: Secondary | ICD-10-CM | POA: Insufficient documentation

## 2017-08-08 DIAGNOSIS — R079 Chest pain, unspecified: Secondary | ICD-10-CM | POA: Insufficient documentation

## 2017-08-08 DIAGNOSIS — F17210 Nicotine dependence, cigarettes, uncomplicated: Secondary | ICD-10-CM | POA: Diagnosis not present

## 2017-08-08 DIAGNOSIS — Z880 Allergy status to penicillin: Secondary | ICD-10-CM | POA: Diagnosis not present

## 2017-08-08 DIAGNOSIS — F250 Schizoaffective disorder, bipolar type: Secondary | ICD-10-CM | POA: Diagnosis not present

## 2017-08-08 DIAGNOSIS — F259 Schizoaffective disorder, unspecified: Secondary | ICD-10-CM | POA: Diagnosis present

## 2017-08-08 LAB — CBC WITH DIFFERENTIAL/PLATELET
BASOS PCT: 1 %
Basophils Absolute: 0.1 10*3/uL (ref 0.0–0.1)
EOS ABS: 0.2 10*3/uL (ref 0.0–0.7)
Eosinophils Relative: 2 %
HEMATOCRIT: 43.1 % (ref 39.0–52.0)
HEMOGLOBIN: 14.7 g/dL (ref 13.0–17.0)
LYMPHS ABS: 1.6 10*3/uL (ref 0.7–4.0)
Lymphocytes Relative: 25 %
MCH: 30.9 pg (ref 26.0–34.0)
MCHC: 34.1 g/dL (ref 30.0–36.0)
MCV: 90.5 fL (ref 78.0–100.0)
MONOS PCT: 8 %
Monocytes Absolute: 0.5 10*3/uL (ref 0.1–1.0)
NEUTROS ABS: 4 10*3/uL (ref 1.7–7.7)
NEUTROS PCT: 64 %
Platelets: 199 10*3/uL (ref 150–400)
RBC: 4.76 MIL/uL (ref 4.22–5.81)
RDW: 13 % (ref 11.5–15.5)
WBC: 6.3 10*3/uL (ref 4.0–10.5)

## 2017-08-08 LAB — COMPREHENSIVE METABOLIC PANEL
ALT: 32 U/L (ref 17–63)
AST: 26 U/L (ref 15–41)
Albumin: 4.3 g/dL (ref 3.5–5.0)
Alkaline Phosphatase: 79 U/L (ref 38–126)
Anion gap: 10 (ref 5–15)
BUN: 9 mg/dL (ref 6–20)
CHLORIDE: 101 mmol/L (ref 101–111)
CO2: 26 mmol/L (ref 22–32)
Calcium: 9.5 mg/dL (ref 8.9–10.3)
Creatinine, Ser: 0.59 mg/dL — ABNORMAL LOW (ref 0.61–1.24)
Glucose, Bld: 100 mg/dL — ABNORMAL HIGH (ref 65–99)
POTASSIUM: 3.6 mmol/L (ref 3.5–5.1)
SODIUM: 137 mmol/L (ref 135–145)
Total Bilirubin: 0.9 mg/dL (ref 0.3–1.2)
Total Protein: 7.3 g/dL (ref 6.5–8.1)

## 2017-08-08 LAB — VALPROIC ACID LEVEL: Valproic Acid Lvl: 81 ug/mL (ref 50.0–100.0)

## 2017-08-08 LAB — TROPONIN I

## 2017-08-08 LAB — ETHANOL

## 2017-08-08 MED ORDER — QUETIAPINE FUMARATE 300 MG PO TABS
300.0000 mg | ORAL_TABLET | Freq: Every day | ORAL | Status: DC
  Administered 2017-08-08: 300 mg via ORAL
  Filled 2017-08-08: qty 1

## 2017-08-08 MED ORDER — DIVALPROEX SODIUM ER 500 MG PO TB24
1000.0000 mg | ORAL_TABLET | Freq: Every day | ORAL | Status: DC
  Administered 2017-08-08: 1000 mg via ORAL
  Filled 2017-08-08: qty 2

## 2017-08-08 NOTE — BH Assessment (Signed)
Tele Assessment Note   Patient Name: Franklin Ayers MRN: [id number] Referring Physician: Dr. Davonna Belling Location of Patient:  [facility] Location of Provider: [place] is an 56 y.o. male who was brought to [facility] by his mother for "not eating and drinking".  Pt denies having suicidal thoughts and passive suicidal behaviors such as not eating. Pt states "I have problems with dieting because it's difficult to swallow."  Pt continues to state "I take my medicine when my throat is not hurting."  Pt denies homicidal ideations and aggressive behavior.  Pt denies A/V hallucination.  Pt states "its hard for me to see sometimes."  Pt denies history of SA.  When asked if pt could contract for safety if discharged? Pt stated "I'm not going to kill myself or anything but I don't believe I can take care of myself if I go home."  Pt states he will voluntarily sign in if inpatient is recommended.  Pt was previously treated inpatient at [facility] 07/2017.  Pt lives with his mother Selwyn Reason [phone]) after being discharged from a group home.  According previous states from pt's mother in pt's chart "pt lived at group home for 10 yrs and was recently kicked out due to missing curfew and not returning for 2 days.  Pt receives outpatient services at [facility] with Dr. Filomena Jungling for monthly medications."    Patient was wearing scrubs and appeared appropriately groomed.  Pt was alert throughout the assessment.  Patient made fair eye contact and had normal psychomotor activity.  Patient spoke in a soft voice without pressured speech.  Pt expressed feeling fine.  Pt's affect appeared euthymic/normal mood and congruent with stated mood. Pt's thought process was relevant and coherent.  Pt presented with limited insight and judgement  Disposition: Case discussed with [place] provider, Lindon Romp, NP who recommends observation for safety and  stability with AM psychiatry re-evaluation and possible social work consult for placement. Dr. Davonna Belling and pt's nurse, Yancey Flemings, RN was informed of recommended disposition.  Diagnosis: Schizoaffective disorder  Past Medical History:  Past Medical History:  Diagnosis Date  . Mental disorder   . Schizoaffective disorder [facility])     Past Surgical History:  Procedure Laterality Date  . TONSILLECTOMY      Family History:  Family History  Problem Relation Age of Onset  . Cancer Other   . Stroke Other     Social History:  reports that he has been smoking cigarettes.  He has been smoking about 2.00 packs per day. he has never used smokeless tobacco. He reports that he drinks alcohol. He reports that he does not use drugs.  Additional Social History:  Alcohol / Drug Use Pain Medications: See MARs Prescriptions: See MARs Over the Counter: See MARs History of alcohol / drug use?: No history of alcohol / drug abuse Longest period of sobriety (when/how long): Pt denies having a history of substance use or abuse  CIWA: CIWA-Ar BP: 123/77 Pulse Rate: 78 COWS:    PATIENT STRENGTHS: (choose at least two) Average or above average intelligence Motivation for treatment/growth Supportive family/friends  Allergies:  Allergies  Allergen Reactions  . Penicillins Shortness Of Breath    .Has patient had a PCN reaction causing immediate rash, facial/tongue/throat swelling, SOB or lightheadedness with hypotension: no Has patient had a PCN reaction causing severe rash involving mucus membranes or skin necrosis: no Has patient had a PCN reaction that required hospitalization:  no Has patient had a PCN reaction occurring within the last 10 years: no If all of the above answers are "NO", then may proceed with Cephalosporin use.     Home Medications:  (Not in a hospital admission)  OB/GYN Status:  No LMP for male patient.  General Assessment Data Location of Assessment: [facility] ED TTS  Assessment: In system Is this a Tele or Face-to-Face Assessment?: Tele Assessment Is this an Initial Assessment or a Re-assessment for this encounter?: Initial Assessment Marital status: Single Is patient pregnant?: No Pregnancy Status: No Living Arrangements: Parent(Pt report living at home with his mother) Can pt return to current living arrangement?: Yes Admission Status: Voluntary Is patient capable of signing voluntary admission?: Yes Referral Source: Self/Family/Friend Insurance type: Medicare     Crisis Care Plan Living Arrangements: Parent(Pt report living at home with his mother) Name of Psychiatrist: Dr. Filomena Jungling, [facility](Per pt's chart)  Education Status Is patient currently in school?: No Current Grade: na Highest grade of school patient has completed: 12th Name of school: na Contact person: na  Risk to self with the past 6 months Suicidal Ideation: No Has patient been a risk to self within the past 6 months prior to admission? : No Suicidal Intent: No Has patient had any suicidal intent within the past 6 months prior to admission? : No Is patient at risk for suicide?: No Suicidal Plan?: No Has patient had any suicidal plan within the past 6 months prior to admission? : No Access to Means: No(pt denies having access to a gun in his home) What has been your use of drugs/alcohol within the last 12 months?: Pt denies hx of drug use Previous Attempts/Gestures: No(Pt denies prior attempts) How many times?: 0 Other Self Harm Risks: na Triggers for Past Attempts: None known Intentional Self Injurious Behavior: None Family Suicide History: No Recent stressful life event(s): Other (Comment)(pt reports having difficulty swallowing) Persecutory voices/beliefs?: No Depression: No Depression Symptoms: (pt denies having any of these symptoms) Substance abuse history and/or treatment for substance abuse?: No Suicide prevention information given to non-admitted  patients: Not applicable  Risk to Others within the past 6 months Homicidal Ideation: No Does patient have any lifetime risk of violence toward others beyond the six months prior to admission? : Yes (comment) Thoughts of Harm to Others: No-Not Currently Present/Within Last 6 Months Current Homicidal Intent: No Current Homicidal Plan: No Access to Homicidal Means: No History of harm to others?: No Assessment of Violence: None Noted Violent Behavior Description: NA Does patient have access to weapons?: No Criminal Charges Pending?: No Does patient have a court date: No Is patient on probation?: No  Psychosis Hallucinations: None noted Delusions: None noted  Mental Status Report Appearance/Hygiene: Unremarkable Eye Contact: Fair Motor Activity: Freedom of movement Speech: Logical/coherent Level of Consciousness: Alert Mood: Anxious(pt reports feeling anxious) Affect: Anxious(pt report being anxious) Anxiety Level: Minimal Thought Processes: Unable to Assess Judgement: Unable to Assess Orientation: Person, Place, Situation Obsessive Compulsive Thoughts/Behaviors: None  Cognitive Functioning Concentration: Unable to Assess Memory: Unable to Assess IQ: Average Insight: Unable to Assess Impulse Control: Unable to Assess Appetite: Poor(pt report not wanting to eat because of his throat hurting) Sleep: Decreased Total Hours of Sleep: 4 Vegetative Symptoms: Unable to Assess  ADLScreening [facility] Assessment Services) Patient's cognitive ability adequate to safely complete daily activities?: Yes Patient able to express need for assistance with ADLs?: Yes Independently performs ADLs?: Yes (appropriate for developmental age)  Prior Inpatient Therapy Prior Inpatient Therapy:  Yes Prior Therapy Dates: 2018, and previous admissions Prior Therapy Facilty/Provider(s): [facility] Reason for Treatment: Schizoaffective  Prior Outpatient Therapy Prior Outpatient Therapy: Yes Prior  Therapy Dates: Earlier in 2018 Prior Therapy Facilty/Provider(s): [facility] Reason for Treatment: Med [place] Does patient have an ACCT team?: No Does patient have Intensive In-House Services?  : No Does patient have [facility] services? : No Does patient have P4CC services?: No  ADL Screening (condition at time of admission) Patient's cognitive ability adequate to safely complete daily activities?: Yes Is the patient deaf or have difficulty hearing?: No Does the patient have difficulty seeing, even when wearing glasses/contacts?: No Does the patient have difficulty concentrating, remembering, or making decisions?: No Patient able to express need for assistance with ADLs?: Yes Does the patient have difficulty dressing or bathing?: No Independently performs ADLs?: Yes (appropriate for developmental age) Does the patient have difficulty walking or climbing stairs?: No(None reported) Weakness of Legs: None Weakness of Arms/Hands: None  Home Assistive Devices/Equipment Home Assistive Devices/Equipment: None    Abuse/Neglect Assessment (Assessment to be complete while patient is alone) Abuse/Neglect Assessment Can Be Completed: Yes Physical Abuse: Denies Verbal Abuse: Denies Sexual Abuse: Denies Exploitation of patient/patient's resources: Denies Self-Neglect: Denies Values / Beliefs Cultural Requests During Hospitalization: None Spiritual Requests During Hospitalization: None Consults Spiritual Care Consult Needed: No Social Work Consult Needed: Yes (Comment)(Pt report needing help to take care of himself at home) Regulatory affairs officer (For Healthcare) Does Patient Have a Medical Advance Directive?: No Would patient like information on creating a medical advance directive?: No - Patient declined    Additional Information 1:1 In Past 12 Months?: No CIRT Risk: No Elopement Risk: No Does patient have medical clearance?: Yes     Disposition: Case discussed with [place]  provider, Lindon Romp, NP who recommends observation for safety and stability with AM psychiatry re-evaluation and possible social work consult. Dr. Davonna Belling and pt's nurse, Yancey Flemings, RN was informed of recommended disposition. Disposition Initial Assessment Completed for this Encounter: Yes Disposition of Patient: Re-evaluation by Psychiatry recommended(Per Lindon Romp, NP)  This service was provided via telemedicine using a 2-way, interactive audio and video technology.  Names of all persons participating in this telemedicine service and their role in this encounter.               Veleka Djordjevic L Warrick Llera, MS, LPCA, [place] 08/08/2017 9:40 PM

## 2017-08-08 NOTE — ED Provider Notes (Signed)
[facility] DEPT Provider Note   CSN: [id number] Arrival date & time: 08/08/17  1658     History   Chief Complaint Chief Complaint  Patient presents with  . Anorexia  . Psychiatric Evaluation   Level 5 caveat due to psychiatric disorder. HPI Franklin Ayers is a 56 y.o. male.  HPI Patient presents from home.  Reportedly has not been eating and drinking.  Has had some suicidal thoughts.  History of schizoaffective disorder.  Had been in a group home until recently.  Discharged from [facility] 5 days ago back to his house.  Unsure if he has been taking his medicines.  Patient is somewhat difficult to get a history from.  Complaining of right chest pain also. Past Medical History:  Diagnosis Date  . Mental disorder   . Schizoaffective disorder [facility])     Patient Active Problem List   Diagnosis Date Noted  . Schizoaffective disorder, bipolar type ([place]) 12/23/2016    Past Surgical History:  Procedure Laterality Date  . TONSILLECTOMY         Home Medications    Prior to Admission medications   Medication Sig Start Date End Date Taking? Authorizing Provider  divalproex (DEPAKOTE ER) 500 MG 24 hr tablet Take 2 tablets (1,000 mg total) by mouth at bedtime. For mood control 08/03/17  Yes Money, Lowry Ram, FNP  QUEtiapine (SEROQUEL) 100 MG tablet Take 100 mg by mouth at bedtime. 07/19/17  Yes [provider]  QUEtiapine (SEROQUEL) 300 MG tablet Take 1 tablet (300 mg total) by mouth at bedtime. For mood control Patient not taking: Reported on 08/08/2017 08/03/17   Money, Lowry Ram, FNP    Family History Family History  Problem Relation Age of Onset  . Cancer Other   . Stroke Other     Social History Social History   Tobacco Use  . Smoking status: Current Every Day Smoker    Packs/day: 2.00    Types: Cigarettes  . Smokeless tobacco: Never Used  . Tobacco comment: refused  Substance Use Topics  . Alcohol use:  Yes    Comment: occasional  . Drug use: No     Allergies   Penicillins   Review of Systems Review of Systems  Unable to perform ROS: Psychiatric disorder     Physical Exam Updated Vital Signs BP 123/77 (BP Location: Left Arm)   Pulse 78   Temp 98 F (36.7 C) (Oral)   Resp 18   SpO2 100%   Physical Exam  Constitutional: He appears well-developed.  HENT:  Head: Atraumatic.  Eyes: Pupils are equal, round, and reactive to light.  Neck: Neck supple.  Cardiovascular: Normal rate.  Pulmonary/Chest: He exhibits tenderness.  Tenderness to right lateral chest wall without rash or deformity.  No crepitance.  Abdominal: Soft.  Musculoskeletal: He exhibits no edema.  Neurological: He is alert.  Psychiatric:  Somewhat flat and strange affect.  Difficult to get history from.     ED Treatments / Results  Labs (all labs ordered are listed, but only abnormal results are displayed) Labs Reviewed  COMPREHENSIVE METABOLIC PANEL - Abnormal; Notable for the following components:      Result Value   Glucose, Bld 100 (*)    Creatinine, Ser 0.59 (*)    All other components within normal limits  ETHANOL  VALPROIC ACID LEVEL  CBC WITH DIFFERENTIAL/PLATELET  TROPONIN I  RAPID URINE DRUG SCREEN, HOSP PERFORMED    EKG  EKG Interpretation  Date/Time:  Tuesday August 08 2017 19:04:02 EST Ventricular Rate:  68 PR Interval:  158 QRS Duration: 90 QT Interval:  444 QTC Calculation: 472 R Axis:   67 Text Interpretation:  Sinus rhythm with sinus arrhythmia with occasional Premature ventricular complexes Otherwise normal ECG Confirmed by Davonna Belling [phone]) on 08/08/2017 7:58:22 PM       Radiology Dg Chest 2 View  Result Date: 08/08/2017 CLINICAL DATA:  Medical clearance. EXAM: CHEST  2 VIEW COMPARISON:  None. FINDINGS: The cardiomediastinal silhouette is normal in size. Normal pulmonary vascularity. No focal consolidation, pleural effusion, or pneumothorax. No acute  osseous abnormality. Degenerative changes of the thoracic spine. IMPRESSION: No active cardiopulmonary disease. Electronically Signed   By: Titus Dubin M.D.   On: 08/08/2017 20:07    Procedures Procedures (including critical care time)  Medications Ordered in ED Medications - No data to display   Initial Impression / Assessment and Plan / ED Course  I have reviewed the triage vital signs and the nursing notes.  Pertinent labs & imaging results that were available during my care of the patient were reviewed by me and considered in my medical decision making (see chart for details).     Patient with schizoaffective disorder.  Has only been back at home for the last 5 days and reportedly not doing well there.  Medically cleared.  Chest x-ray reassuring and EKG reassuring.  Doubt cardiac cause of the pain.  To be seen by TTS.  Final Clinical Impressions(s) / ED Diagnoses   Final diagnoses:  Schizoaffective disorder, bipolar type [facility])    ED Discharge Orders    None       Davonna Belling, MD 08/08/17 2027

## 2017-08-08 NOTE — ED Triage Notes (Addendum)
Patient brought in from home by mother. Reports not eating and drinking well. States that he was recently discharged from [facility]. When ask what brought him here today, he states "I feel like in dilating". Asked patient more question not responding, just stares off at wall. When asked about being SI/HI, patient does not respond.

## 2017-08-09 DIAGNOSIS — F250 Schizoaffective disorder, bipolar type: Secondary | ICD-10-CM | POA: Diagnosis not present

## 2017-08-09 DIAGNOSIS — F17210 Nicotine dependence, cigarettes, uncomplicated: Secondary | ICD-10-CM | POA: Diagnosis not present

## 2017-08-09 LAB — RAPID URINE DRUG SCREEN, HOSP PERFORMED
Amphetamines: NOT DETECTED
BARBITURATES: NOT DETECTED
BENZODIAZEPINES: NOT DETECTED
COCAINE: NOT DETECTED
Opiates: NOT DETECTED
TETRAHYDROCANNABINOL: NOT DETECTED

## 2017-08-09 NOTE — ED Notes (Signed)
Social work consult placed based on sister's concerns regarding placement.

## 2017-08-09 NOTE — Discharge Instructions (Signed)
For your behavioral health needs, you are advised to continue treatment with the [facility] ACT Team:       [facility]      [address], [address]      , [place] [id number]      [phone] 

## 2017-08-09 NOTE — ED Notes (Signed)
Patient being discharged with sister at this time. Sister stated that she no longer wants to wait for a social work consult and that she would follow up with patient's PCP. Patient stable, and being discharged with family at this time. No change or acute distress noted to patient's condition.

## 2017-08-09 NOTE — Consult Note (Signed)
[place] [facility] Consult   Reason for Consult: Passive suicidal behavior  Referring Physician:  EDP Patient Identification: Franklin Ayers MRN:  [id number] Principal Diagnosis: Schizoaffective disorder, bipolar type [facility]) Diagnosis:   Patient Active Problem List   Diagnosis Date Noted  . Schizoaffective disorder, bipolar type ([place]) [F25.0] 12/23/2016    Total Time spent with patient: 45 minutes  Subjective:   Franklin Ayers is a 56 y.o. male patient admitted with .  HPI:  Pt was seen and chart reviewed with treatment team and Dr Darleene Cleaver. Pt stated he was told to come to the [facility] but could not specify why he is here. Pt has had 5 ED visits and one inpatient psychiatric admission in the past six months. Pt lives with his mother and she brings him to the emergency room when she feels he is being a danger to himself. Pt's mother stated that Pt has not been eating or drinking and is trying to commit suicide by starvation.  Pt denies suicidal/homicidal ideation, denies auditory/visual hallucinations and does not appear to be responding to internal stimuli. Pt has an ACT team with [facility]. Pt is psychiatrically clear and will be seen by social work prior to discharge.   Past Psychiatric History: As above  Risk to Self: None Risk to Others: None Prior Inpatient Therapy: Prior Inpatient Therapy: Yes Prior Therapy Dates: 2018, and previous admissions Prior Therapy Facilty/Provider(s): [facility], Fabio Neighbors Reason for Treatment: Schizoaffective Prior Outpatient Therapy: Prior Outpatient Therapy: Yes Prior Therapy Dates: Earlier in 2018 Prior Therapy Facilty/Provider(s): [facility] Reason for Treatment: Med mang Does patient have an ACCT team?: No Does patient have Intensive In-House Services?  : No Does patient have [facility] services? : No Does patient have P4CC services?: No  Past Medical History:  Past Medical History:  Diagnosis Date  . Mental disorder   .  Schizoaffective disorder [facility])     Past Surgical History:  Procedure Laterality Date  . TONSILLECTOMY     Family History:  Family History  Problem Relation Age of Onset  . Cancer Other   . Stroke Other    Family Psychiatric  History: Unknown Social History:  Social History   Substance and Sexual Activity  Alcohol Use Yes   Comment: occasional     Social History   Substance and Sexual Activity  Drug Use No    Social History   Socioeconomic History  . Marital status: Single    Spouse name: None  . Number of children: None  . Years of education: None  . Highest education level: None  Social Needs  . Financial resource strain: None  . Food insecurity - worry: None  . Food insecurity - inability: None  . Transportation needs - medical: None  . Transportation needs - non-medical: None  Occupational History  . None  Tobacco Use  . Smoking status: Current Every Day Smoker    Packs/day: 2.00    Types: Cigarettes  . Smokeless tobacco: Never Used  . Tobacco comment: refused  Substance and Sexual Activity  . Alcohol use: Yes    Comment: occasional  . Drug use: No  . Sexual activity: No  Other Topics Concern  . None  Social History Narrative  . None   Additional Social History:    Allergies:   Allergies  Allergen Reactions  . Penicillins Shortness Of Breath    .Has patient had a PCN reaction causing immediate rash, facial/tongue/throat swelling, SOB or lightheadedness with hypotension: no Has patient had  a PCN reaction causing severe rash involving mucus membranes or skin necrosis: no Has patient had a PCN reaction that required hospitalization: no Has patient had a PCN reaction occurring within the last 10 years: no If all of the above answers are "NO", then may proceed with Cephalosporin use.     Labs:  Results for orders placed or performed during the hospital encounter of 08/08/17 (from the past 48 hour(s))  Ethanol     Status: None   Collection Time:  08/08/17  6:50 PM  Result Value Ref Range   Alcohol, Ethyl (B) <10 <10 mg/dL    Comment:        LOWEST DETECTABLE LIMIT FOR SERUM ALCOHOL IS 10 mg/dL FOR MEDICAL PURPOSES ONLY   Valproic acid level     Status: None   Collection Time: 08/08/17  6:50 PM  Result Value Ref Range   Valproic Acid Lvl 81 50.0 - 100.0 ug/mL  Comprehensive metabolic panel     Status: Abnormal   Collection Time: 08/08/17  6:57 PM  Result Value Ref Range   Sodium 137 135 - 145 mmol/L   Potassium 3.6 3.5 - 5.1 mmol/L   Chloride 101 101 - 111 mmol/L   CO2 26 22 - 32 mmol/L   Glucose, Bld 100 (H) 65 - 99 mg/dL   BUN 9 6 - 20 mg/dL   Creatinine, Ser 0.59 (L) 0.61 - 1.24 mg/dL   Calcium 9.5 8.9 - 10.3 mg/dL   Total Protein 7.3 6.5 - 8.1 g/dL   Albumin 4.3 3.5 - 5.0 g/dL   AST 26 15 - 41 U/L   ALT 32 17 - 63 U/L   Alkaline Phosphatase 79 38 - 126 U/L   Total Bilirubin 0.9 0.3 - 1.2 mg/dL   GFR calc non Af Amer >60 >60 mL/min   GFR calc Af Amer >60 >60 mL/min    Comment: (NOTE) The eGFR has been calculated using the CKD EPI equation. This calculation has not been validated in all clinical situations. eGFR's persistently <60 mL/min signify possible Chronic Kidney Disease.    Anion gap 10 5 - 15  CBC with Differential     Status: None   Collection Time: 08/08/17  6:57 PM  Result Value Ref Range   WBC 6.3 4.0 - 10.5 K/uL   RBC 4.76 4.22 - 5.81 MIL/uL   Hemoglobin 14.7 13.0 - 17.0 g/dL   HCT 43.1 39.0 - 52.0 %   MCV 90.5 78.0 - 100.0 fL   MCH 30.9 26.0 - 34.0 pg   MCHC 34.1 30.0 - 36.0 g/dL   RDW 13.0 11.5 - 15.5 %   Platelets 199 150 - 400 K/uL   Neutrophils Relative % 64 %   Neutro Abs 4.0 1.7 - 7.7 K/uL   Lymphocytes Relative 25 %   Lymphs Abs 1.6 0.7 - 4.0 K/uL   Monocytes Relative 8 %   Monocytes Absolute 0.5 0.1 - 1.0 K/uL   Eosinophils Relative 2 %   Eosinophils Absolute 0.2 0.0 - 0.7 K/uL   Basophils Relative 1 %   Basophils Absolute 0.1 0.0 - 0.1 K/uL  Troponin I     Status: None    Collection Time: 08/08/17  6:57 PM  Result Value Ref Range   Troponin I <0.03 <0.03 ng/mL  Urine rapid drug screen (hosp performed)     Status: None   Collection Time: 08/09/17  5:22 AM  Result Value Ref Range   Opiates NONE DETECTED NONE DETECTED  Cocaine NONE DETECTED NONE DETECTED   Benzodiazepines NONE DETECTED NONE DETECTED   Amphetamines NONE DETECTED NONE DETECTED   Tetrahydrocannabinol NONE DETECTED NONE DETECTED   Barbiturates NONE DETECTED NONE DETECTED    Comment:        DRUG SCREEN FOR MEDICAL PURPOSES ONLY.  IF CONFIRMATION IS NEEDED FOR ANY PURPOSE, NOTIFY LAB WITHIN 5 DAYS.        LOWEST DETECTABLE LIMITS FOR URINE DRUG SCREEN Drug Class       Cutoff (ng/mL) Amphetamine      1000 Barbiturate      200 Benzodiazepine   338 Tricyclics       250 Opiates          300 Cocaine          300 THC              50     Current Facility-Administered Medications  Medication Dose Route Frequency Provider Last Rate Last Dose  . divalproex (DEPAKOTE ER) 24 hr tablet 1,000 mg  1,000 mg Oral Lowanda Foster, MD   1,000 mg at 08/08/17 2149  . QUEtiapine (SEROQUEL) tablet 300 mg  300 mg Oral Lowanda Foster, MD   300 mg at 08/08/17 2148   Current Outpatient Medications  Medication Sig Dispense Refill  . divalproex (DEPAKOTE ER) 500 MG 24 hr tablet Take 2 tablets (1,000 mg total) by mouth at bedtime. For mood control 60 tablet 0  . QUEtiapine (SEROQUEL) 100 MG tablet Take 100 mg by mouth at bedtime.    Marland Kitchen QUEtiapine (SEROQUEL) 300 MG tablet Take 1 tablet (300 mg total) by mouth at bedtime. For mood control (Patient not taking: Reported on 08/08/2017) 30 tablet 0    Musculoskeletal: Strength & Muscle Tone: within normal limits Gait & Station: normal Patient leans: N/A  Psychiatric Specialty Exam: Physical Exam  Constitutional: He is oriented to person, place, and time. He appears well-developed and well-nourished.  Respiratory: Effort normal.  Musculoskeletal:  Normal range of motion.  Neurological: He is alert and oriented to person, place, and time.  Psychiatric: His speech is normal. Thought content normal. He is slowed. Cognition and memory are impaired. He expresses impulsivity. He exhibits a depressed mood.    Review of Systems  Psychiatric/Behavioral: Positive for depression. Negative for hallucinations, memory loss, substance abuse and suicidal ideas. The patient is not nervous/anxious and does not have insomnia.   All other systems reviewed and are negative.   Blood pressure 127/65, pulse 77, temperature 97.8 F (36.6 C), temperature source Oral, resp. rate 12, SpO2 100 %.There is no height or weight on file to calculate BMI.  General Appearance: Casual  Eye Contact:  Fair  Speech:  Slow  Volume:  Decreased  Mood:  Depressed  Affect:  Congruent and Depressed  Thought Process:  Coherent  Orientation:  Full (Time, Place, and Person)  Thought Content:  Illogical  Suicidal Thoughts:  No  Homicidal Thoughts:  No  Memory:  Immediate;   Good Recent;   Fair Remote;   Fair  Judgement:  Poor  Insight:  Lacking  Psychomotor Activity:  Normal  Concentration:  Concentration: Fair and Attention Span: Fair  Recall:  [employer] of Knowledge:  Good  Language:  Good  Akathisia:  No  Handed:  Right  AIMS (if indicated):     Assets:  Agricultural consultant Housing Resilience  ADL's:  Intact  Cognition:  WNL  Sleep:  Treatment Plan Summary: Plan  Psychotherapeutic Services ACT Team  Discharge Home after social work has seen Pt Follow up with [facility] Take all medications as prescribed  Disposition: No evidence of imminent risk to self or others at present.   Patient does not meet criteria for psychiatric inpatient admission. Supportive therapy provided about ongoing stressors.  Ethelene Hal, NP 08/09/2017 11:09 AM  Patient seen face-to-face for psychiatric evaluation, chart  reviewed and case discussed with the physician extender and developed treatment plan. Reviewed the information documented and agree with the treatment plan. Corena Pilgrim, MD

## 2017-08-09 NOTE — Progress Notes (Signed)
Consult request has been received. CSW attempting to follow up at present time.  CSW review chart and noted Pt D/C'd with sister prior to [place] responding.  Alphonse Guild. Saranya Harlin, LCSW, LCAS, CSI Clinical Social Worker Ph: [phone]

## 2017-08-09 NOTE — B[facility] Suicide Risk Assessment (Signed)
Suicide Risk Assessment  Discharge Assessment   [facility] Discharge Suicide Risk Assessment   Principal Problem: Schizoaffective disorder, bipolar type [facility]) Discharge Diagnoses:  Patient Active Problem List   Diagnosis Date Noted  . Schizoaffective disorder, bipolar type ([place]) [F25.0] 12/23/2016    Total Time spent with patient: 45 minutes  Musculoskeletal: Strength & Muscle Tone: within normal limits Gait & Station: normal Patient leans: N/A  Psychiatric Specialty Exam: Physical Exam  Constitutional: He is oriented to person, place, and time. He appears well-developed and well-nourished.  Respiratory: Effort normal.  Musculoskeletal: Normal range of motion.  Neurological: He is alert and oriented to person, place, and time.  Psychiatric: His speech is normal. Thought content normal. He is slowed. Cognition and memory are impaired. He expresses impulsivity. He exhibits a depressed mood.   Review of Systems  Psychiatric/Behavioral: Positive for depression. Negative for hallucinations, memory loss, substance abuse and suicidal ideas. The patient is not nervous/anxious and does not have insomnia.   All other systems reviewed and are negative.  Blood pressure 127/65, pulse 77, temperature 97.8 F (36.6 C), temperature source Oral, resp. rate 12, SpO2 100 %.There is no height or weight on file to calculate BMI. General Appearance: Casual Eye Contact:  Fair Speech:  Slow Volume:  Decreased Mood:  Depressed Affect:  Congruent and Depressed Thought Process:  Coherent Orientation:  Full (Time, Place, and Person) Thought Content:  Illogical Suicidal Thoughts:  No Homicidal Thoughts:  No Memory:  Immediate;   Good Recent;   Fair Remote;   Fair Judgement:  Poor Insight:  Lacking Psychomotor Activity:  Normal Concentration:  Concentration: Fair and Attention Span: Fair Recall:  [employer] of Knowledge:  Good Language:  Good Akathisia:  No Handed:  Right AIMS (if indicated):     Assets:  Agricultural consultant Housing Resilience ADL's:  Intact Cognition:  WNL   Mental Status Per Nursing Assessment::   On Admission:   Passive suicidal behavior  Demographic Factors:  Male, Caucasian, Low socioeconomic status and Unemployed  Loss Factors: Financial problems/change in socioeconomic status  Historical Factors: Impulsivity  Risk Reduction Factors:   Sense of responsibility to family and Living with another person, especially a relative  Continued Clinical Symptoms:  Bipolar Disorder:   Depressive phase  Cognitive Features That Contribute To Risk:  Closed-mindedness    Suicide Risk:  Minimal: No identifiable suicidal ideation.  Patients presenting with no risk factors but with morbid ruminations; may be classified as minimal risk based on the severity of the depressive symptoms    Plan Of Care/Follow-up recommendations:  Activity:  as tolerated  Diet:  Heart Healthy  Ethelene Hal, NP 08/09/2017, 11:32 AM

## 2017-08-09 NOTE — ED Notes (Signed)
Patient's sister is here to pick up patient. Sister has expressed concerns regarding Arnol not eating and her 54 year old mother is unable to provide adequate care. Plan of care explained to patient's sister and charge nurse as well as attending physician made aware.

## 2017-08-09 NOTE — ED Notes (Signed)
Spoke with mother regarding patient's disposition. Explained to mother that there is no medical reason to keep the patient at this time. Discussed that patient is ready and prepared for discharge. Mother voiced concerns about facility placement, resources will be explained to patient and family prior to discharge.

## 2017-08-09 NOTE — Progress Notes (Signed)
CSW -Placement.  CSW assisting with patient discharge.   The patient has been living at home with his mother. CSW called to confirm if patient is able to return. Pt. Mother reports the patient is able to return home but she is concern something medically is "causing the patient not to eat."  She reports the patient states, "there is something in my throat."   Patient mother request evaluation to determine if this is so.  She will not pick patient up until medical staff has called and confirm.  CSW inform the nurse about this concern.   CSW will continue to follow.    Kathrin Greathouse, Latanya Presser, MSW Clinical Social Worker

## 2017-08-17 ENCOUNTER — Encounter (HOSPITAL_COMMUNITY): Payer: Self-pay | Admitting: Emergency Medicine

## 2017-08-17 ENCOUNTER — Emergency Department (HOSPITAL_COMMUNITY)
Admission: EM | Admit: 2017-08-17 | Discharge: 2017-08-17 | Disposition: A | Discharge status: Home / Self Care | Payer: Medicare Other | Attending: Emergency Medicine | Admitting: Emergency Medicine

## 2017-08-17 DIAGNOSIS — F341 Dysthymic disorder: Secondary | ICD-10-CM | POA: Insufficient documentation

## 2017-08-17 DIAGNOSIS — Z79899 Other long term (current) drug therapy: Secondary | ICD-10-CM | POA: Insufficient documentation

## 2017-08-17 DIAGNOSIS — F17210 Nicotine dependence, cigarettes, uncomplicated: Secondary | ICD-10-CM | POA: Insufficient documentation

## 2017-08-17 DIAGNOSIS — F329 Major depressive disorder, single episode, unspecified: Secondary | ICD-10-CM | POA: Diagnosis present

## 2017-08-17 MED ORDER — ACETAMINOPHEN 325 MG PO TABS: 650.0000 mg | ORAL_TABLET | ORAL | Status: DC | PRN

## 2017-08-17 MED ORDER — ONDANSETRON HCL 4 MG PO TABS: 4.0000 mg | ORAL_TABLET | Freq: Three times a day (TID) | ORAL | Status: DC | PRN

## 2017-08-17 NOTE — Progress Notes (Signed)
CSW met with pt's mother at pt's bedside. CSW provided pt's mother with a list of ALF to contact to see if they have Medicaid Beds available. Pt's mother stated she felt comfortable with calling the ALFs to see if they have availability. Pt's mother explained that she and the pt were at [facility] earlier in the day, pursuing services through them.   Plan: Pt will return home tonight when cleared to do so. Pt's mother will reach out to ALFs in the area to see availability for patient from home.   Wendelyn Breslow, [facility]  [phone]

## 2017-08-17 NOTE — BH Assessment (Signed)
Assessment Note  Franklin Ayers is an 56 y.o. male. Pt seen with Margarita Grizzle, NP and this Probation officer. Pt denies SI/HI/AVH. Pt states he is currently in the ED due to changes in his vitals. Pt discussed issues with eating due to not having his dentures. Pt was poor historian. Pt states that he was recently D/C from [facility]. Pt states he had the same concern about eating during hospitalization. Pt denies any psychiatric changes since his last hospitalization.   Pt's mother was present during the assessment. Per Pt's mother she has difficulty taking care of the Pt due to his ongoing issues with eating. Pt's mother states she bought the Pt to the ED for long-term placement because she can no longer take care of the Pt's needs. Pt's mother states she would like the Pt placed in an assisted living or group home setting.  This Probation officer and Margarita Grizzle, NP thoroughly explained what resources and assistance can be provided by the ED. The Pt's mother was informed that the ED cannot find the Pt an assisted living facility or group home.  Margarita Grizzle, NP psych cleared the Pt. Margarita Grizzle placed a SW consult.  Roderic Palau in [place] was notified of the SW consult.  Diagnosis:  F25.0 Schizoaffective   Past Medical History:  Past Medical History:  Diagnosis Date  . Mental disorder   . Schizoaffective disorder [facility])     Past Surgical History:  Procedure Laterality Date  . TONSILLECTOMY      Family History:  Family History  Problem Relation Age of Onset  . Cancer Other   . Stroke Other     Social History:  reports that he has been smoking cigarettes.  He has been smoking about 2.00 packs per day. he has never used smokeless tobacco. He reports that he drinks alcohol. He reports that he does not use drugs.  Additional Social History:  Alcohol / Drug Use Pain Medications: please see mar Prescriptions: please see mar Over the Counter: please see mar History of alcohol / drug use?: No history of alcohol / drug abuse Longest period  of sobriety (when/how long): NA  CIWA: CIWA-Ar BP: 109/68 Pulse Rate: 61 COWS:    Allergies:  Allergies  Allergen Reactions  . Penicillins Shortness Of Breath    .Has patient had a PCN reaction causing immediate rash, facial/tongue/throat swelling, SOB or lightheadedness with hypotension: no Has patient had a PCN reaction causing severe rash involving mucus membranes or skin necrosis: no Has patient had a PCN reaction that required hospitalization: no Has patient had a PCN reaction occurring within the last 10 years: no If all of the above answers are "NO", then may proceed with Cephalosporin use.     Home Medications:  (Not in a hospital admission)  OB/GYN Status:  No LMP for male patient.  General Assessment Data Location of Assessment: [facility] TTS Assessment: In system Is this a Tele or Face-to-Face Assessment?: Face-to-Face Is this an Initial Assessment or a Re-assessment for this encounter?: Initial Assessment Marital status: Single Maiden name: NA Is patient pregnant?: No Pregnancy Status: No Living Arrangements: Parent Can pt return to current living arrangement?: Yes Admission Status: Voluntary Is patient capable of signing voluntary admission?: Yes Referral Source: Self/Family/Friend Insurance type: Medicare     Crisis Care Plan Living Arrangements: Parent Legal Guardian: Other:(self) Name of Psychiatrist: Dr. Filomena Jungling, [facility] Name of Therapist: None  Education Status Is patient currently in school?: No Current Grade: NA Highest grade of school patient has completed: 12th Name  of school: na Contact person: na  Risk to self with the past 6 months Suicidal Ideation: No Has patient been a risk to self within the past 6 months prior to admission? : No Suicidal Intent: No Has patient had any suicidal intent within the past 6 months prior to admission? : No Is patient at risk for suicide?: No Suicidal Plan?: No Has patient had any suicidal plan  within the past 6 months prior to admission? : No Access to Means: No What has been your use of drugs/alcohol within the last 12 months?: na Previous Attempts/Gestures: No How many times?: 0 Other Self Harm Risks: na Triggers for Past Attempts: None known Intentional Self Injurious Behavior: None Family Suicide History: No Recent stressful life event(s): Other (Comment)(unknown) Persecutory voices/beliefs?: No Depression: No Depression Symptoms: (Pt denies) Substance abuse history and/or treatment for substance abuse?: No Suicide prevention information given to non-admitted patients: Not applicable  Risk to Others within the past 6 months Homicidal Ideation: No Does patient have any lifetime risk of violence toward others beyond the six months prior to admission? : No Thoughts of Harm to Others: No Comment - Thoughts of Harm to Others: NA Current Homicidal Intent: No Current Homicidal Plan: No Access to Homicidal Means: No Identified Victim: nA History of harm to others?: No Assessment of Violence: None Noted Violent Behavior Description: NA Does patient have access to weapons?: No Criminal Charges Pending?: No Does patient have a court date: No Is patient on probation?: No  Psychosis Hallucinations: None noted Delusions: None noted  Mental Status Report Appearance/Hygiene: Unremarkable Eye Contact: Fair Motor Activity: Freedom of movement Speech: Logical/coherent Level of Consciousness: Alert Mood: Euthymic Affect: Appropriate to circumstance Anxiety Level: Minimal Thought Processes: Coherent, Relevant Judgement: Unimpaired Orientation: Person, Place, Situation, Time Obsessive Compulsive Thoughts/Behaviors: None  Cognitive Functioning Concentration: Normal Memory: Recent Intact, Remote Intact IQ: Average Insight: Fair Impulse Control: Fair Appetite: Poor Weight Loss: 0 Weight Gain: 0 Sleep: No Change Total Hours of Sleep: 8 Vegetative Symptoms:  None  ADLScreening [facility] Assessment Services) Patient's cognitive ability adequate to safely complete daily activities?: Yes Patient able to express need for assistance with ADLs?: No Independently performs ADLs?: Yes (appropriate for developmental age)  Prior Inpatient Therapy Prior Inpatient Therapy: Yes Prior Therapy Dates: 2018, and previous admissions Prior Therapy Facilty/Provider(s): [facility], Fabio Neighbors Reason for Treatment: Schizoaffective  Prior Outpatient Therapy Prior Outpatient Therapy: Yes Prior Therapy Dates: Earlier in 2018 Prior Therapy Facilty/Provider(s): [facility] Reason for Treatment: Med [place] Does patient have an ACCT team?: No Does patient have Intensive In-House Services?  : No Does patient have [facility] services? : No Does patient have P4CC services?: No  ADL Screening (condition at time of admission) Patient's cognitive ability adequate to safely complete daily activities?: Yes Does the patient have difficulty seeing, even when wearing glasses/contacts?: No Does the patient have difficulty concentrating, remembering, or making decisions?: No Patient able to express need for assistance with ADLs?: No Does the patient have difficulty dressing or bathing?: No Independently performs ADLs?: Yes (appropriate for developmental age) Does the patient have difficulty walking or climbing stairs?: No Weakness of Legs: None Weakness of Arms/Hands: None       Abuse/Neglect Assessment (Assessment to be complete while patient is alone) Abuse/Neglect Assessment Can Be Completed: Yes Physical Abuse: Denies Verbal Abuse: Denies Sexual Abuse: Denies Exploitation of patient/patient's resources: Denies     Advance Directives (For Healthcare) Does Patient Have a Medical Advance Directive?: No Would patient like information on creating  a medical advance directive?: No - Patient declined    Additional Information 1:1 In Past 12 Months?: No CIRT Risk: No Elopement  Risk: No Does patient have medical clearance?: Yes     Disposition:  Disposition Initial Assessment Completed for this Encounter: Yes Disposition of Patient: Other dispositions(psych cleared. SW consult) Type of inpatient treatment program: Adult Other disposition(s): Other (Comment)  On Site Evaluation by:   Reviewed with Physician:    Cyndia Bent 08/17/2017 5:34 PM

## 2017-08-17 NOTE — ED Provider Notes (Signed)
[facility] DEPT Provider Note   CSN: [id number] Arrival date & time: 08/17/17  1558     History   Chief Complaint Chief Complaint  Patient presents with  . depression, anxiety    HPI [facility] is a 56 y.o. male.  56 yo M with a chief complaint of increased depression and anxiety.  That is why his mother brought him here.  When asked why the patient was here he told me his diastolic blood pressure must be elevated because he felt the pressure to his body was too low and that was causing him to be constipated.  He had trouble putting this thought process together and then had trouble explaining it when I asked him further questions.  He does state that he is taking his medications.  Level 5 caveat schizophrenia.   The history is provided by the patient and a relative.  Illness  This is a new problem. The current episode started more than 2 days ago. The problem occurs constantly. The problem has been gradually worsening. Pertinent negatives include no chest pain, no abdominal pain, no headaches and no shortness of breath. Nothing aggravates the symptoms. Nothing relieves the symptoms. He has tried nothing for the symptoms. The treatment provided no relief.    Past Medical History:  Diagnosis Date  . Mental disorder   . Schizoaffective disorder [facility])     Patient Active Problem List   Diagnosis Date Noted  . Schizoaffective disorder, bipolar type ([place]) 12/23/2016    Past Surgical History:  Procedure Laterality Date  . TONSILLECTOMY         Home Medications    Prior to Admission medications   Medication Sig Start Date End Date Taking? Authorizing Provider  divalproex (DEPAKOTE ER) 500 MG 24 hr tablet Take 2 tablets (1,000 mg total) by mouth at bedtime. For mood control 08/03/17   Money, Lowry Ram, FNP  QUEtiapine (SEROQUEL) 100 MG tablet Take 100 mg by mouth at bedtime. 07/19/17   [provider]  QUEtiapine (SEROQUEL) 300 MG  tablet Take 1 tablet (300 mg total) by mouth at bedtime. For mood control Patient not taking: Reported on 08/08/2017 08/03/17   Money, Lowry Ram, FNP    Family History Family History  Problem Relation Age of Onset  . Cancer Other   . Stroke Other     Social History Social History   Tobacco Use  . Smoking status: Current Every Day Smoker    Packs/day: 2.00    Types: Cigarettes  . Smokeless tobacco: Never Used  . Tobacco comment: refused  Substance Use Topics  . Alcohol use: Yes    Comment: occasional  . Drug use: No     Allergies   Penicillins   Review of Systems Review of Systems  Constitutional: Negative for chills and fever.  HENT: Negative for congestion and facial swelling.   Eyes: Negative for discharge and visual disturbance.  Respiratory: Negative for shortness of breath.   Cardiovascular: Negative for chest pain and palpitations.  Gastrointestinal: Negative for abdominal pain, diarrhea and vomiting.  Musculoskeletal: Negative for arthralgias and myalgias.  Skin: Negative for color change and rash.  Neurological: Negative for tremors, syncope and headaches.  Psychiatric/Behavioral: Positive for agitation and decreased concentration. Negative for confusion and dysphoric mood. The patient is nervous/anxious.      Physical Exam Updated Vital Signs BP 109/68 (BP Location: Left Arm)   Pulse 61   Temp 97.7 F (36.5 C) (Oral)   Resp 17  SpO2 98%   Physical Exam  Constitutional: He is oriented to person, place, and time. He appears well-developed and well-nourished.  Disheveled  HENT:  Head: Normocephalic and atraumatic.  Eyes: EOM are normal. Pupils are equal, round, and reactive to light.  Neck: Normal range of motion. Neck supple. No JVD present.  Cardiovascular: Normal rate and regular rhythm. Exam reveals no gallop and no friction rub.  No murmur heard. Pulmonary/Chest: No respiratory distress. He has no wheezes.  Abdominal: He exhibits no  distension. There is no rebound and no guarding.  Musculoskeletal: Normal range of motion.  Neurological: He is alert and oriented to person, place, and time.  Skin: No rash noted. No pallor.  Psychiatric: His mood appears anxious. His affect is blunt. His speech is rapid and/or pressured and tangential. He is agitated. He is noncommunicative.  Nursing note and vitals reviewed.    ED Treatments / Results  Labs (all labs ordered are listed, but only abnormal results are displayed) Labs Reviewed - No data to display  EKG  EKG Interpretation None       Radiology No results found.  Procedures Procedures (including critical care time)  Medications Ordered in ED Medications  acetaminophen (TYLENOL) tablet 650 mg (not administered)  ondansetron (ZOFRAN) tablet 4 mg (not administered)     Initial Impression / Assessment and Plan / ED Course  I have reviewed the triage vital signs and the nursing notes.  Pertinent labs & imaging results that were available during my care of the patient were reviewed by me and considered in my medical decision making (see chart for details).     56 yo M well-known to this emergency department has a past medical history of schizophrenia is here with increased depression and anxiety.  Has recently been taking out of his group home for unknown reasons.  Patient states that he is compliant on his medications.  He is disheveled is having trouble communicating with me as he is unable to have a direct train of thought.  Will have TTS evaluated.  I feel he is medically clear at this time.  TTS evaluated the patient and feel that he is clear to go from there standpoint.  He was given resources for assisted living placement.  7:41 PM:  I have discussed the diagnosis/risks/treatment options with the patient and caregiver and believe the pt to be eligible for discharge home to follow-up with PCP. We also discussed returning to the ED immediately if new or  worsening sx occur. We discussed the sx which are most concerning (e.g., sudden worsening pain, fever, inability to tolerate by mouth) that necessitate immediate return. Medications administered to the patient during their visit and any new prescriptions provided to the patient are listed below.  Medications given during this visit Medications  acetaminophen (TYLENOL) tablet 650 mg (not administered)  ondansetron (ZOFRAN) tablet 4 mg (not administered)     The patient appears reasonably screen and/or stabilized for discharge and I doubt any other medical condition or other [facility] requiring further screening, evaluation, or treatment in the ED at this time prior to discharge.    Final Clinical Impressions(s) / ED Diagnoses   Final diagnoses:  Dysthymia    ED Discharge Orders    None       Deno Etienne, DO 08/17/17 1941

## 2017-08-17 NOTE — Progress Notes (Signed)
CSW received a call from TTS that pt's mother is requesting/needing more information about how to receive assistance from [facility] in order to assist the pt's mother in placing the pt into a group home.  CSW will follow up with the pt's mother who is at bedside to offer resources.  Alphonse Guild. Kaniel Kiang, LCSW, LCAS, CSI Clinical Social Worker Ph: [phone]    '

## 2017-08-17 NOTE — ED Triage Notes (Signed)
Patient comes in with his mother with increasing depression and anxiety  Mother says her son is not eating much.  Patient denies suicidal or homicidal ideation.  Denies AV hallucinations.

## 2017-08-17 NOTE — Progress Notes (Signed)
Patient ID: Franklin A[facility], male   DOB: 03-07-1961, 56 y.o.   MRN: [id number]  Pt was seen and discussed with the treatment team. Pt is frequently brought to the [facility], by his mother, for the reason that he is not eating or drinking much. Pt has a mental health history but denies suicidal and homicidal ideation, auditory and visual hallucinations, and does not appear to responding to internal stimuli. Pt's mother stated she can not figure out how to navigate the system to find assisted living facility placement for her son. Pt's mother has been informed that placement is not done via the [facility]. Pt is psychiatrically clear for social work consult and then may be discharged if medically clear.   Ethelene Hal, NP-C 08/17/2017        1722

## 2017-08-17 NOTE — Clinical Social Work Note (Signed)
Clinical Social Work Assessment  Patient Details  Name: [facility] MRN: [id number] Date of Birth: September 23, 1960  Date of referral:  08/17/17               Reason for consult:  Discharge Planning                Permission sought to share information with:  Facility Art therapist granted to share information::  Yes, Verbal Permission Granted  Name::        Agency::     Relationship::     Contact Information:     Housing/Transportation Living arrangements for the past 2 months:  Single Family Home Source of Information:  Parent Patient Interpreter Needed:  None Criminal Activity/Legal Involvement Pertinent to Current Situation/Hospitalization:    Significant Relationships:  Parents Lives with:  Parents Do you feel safe going back to the place where you live?  Yes Need for family participation in patient care:  Yes (Comment)  Care giving concerns:  Pt's mother expressed concerns about pt returning home. Pt's mother would like Pt to be in an ALF.   Social Worker assessment / plan:  CSW met with Pt's mother via pt's bedside. Pt's mother explained her concerns about pt returning home. Pt's mother agreeable to pt returning home with her. Provided pt's mother with list of ALFs in the area. Mother agreed to call them to see if they would accept Medicaid and/or have a Medicaid beds available.   Employment status:    Insurance information:  Medicare PT Recommendations:  Not assessed at this time Information / Referral to community resources:  Other (Comment Required)(Assisted Living Facilities List)  Patient/Family's Response to care:  Pt and pt's mother agreeable to plan of care.   Patient/Family's Understanding of and Emotional Response to Diagnosis, Current Treatment, and Prognosis:  No concerns or questions posed to the CSW at this time.   Emotional Assessment Appearance:  Appears stated age Attitude/Demeanor/Rapport:    Affect (typically observed):     Orientation:  Oriented to Self, Oriented to Place, Oriented to  Time, Oriented to Situation Alcohol / Substance use:    Psych involvement (Current and /or in the community):  Yes (Comment)  Discharge Needs  Concerns to be addressed:  No discharge needs identified Readmission within the last 30 days:  Yes Current discharge risk:  None Barriers to Discharge:  No Barriers Identified   Wendelyn Breslow, LCSW 08/17/2017, 6:30 PM

## 2017-08-21 ENCOUNTER — Other Ambulatory Visit: Payer: Self-pay

## 2017-08-21 ENCOUNTER — Emergency Department (HOSPITAL_COMMUNITY)
Admission: EM | Admit: 2017-08-21 | Discharge: 2017-08-21 | Disposition: A | Discharge status: Home / Self Care | Payer: Medicare Other | Attending: Emergency Medicine | Admitting: Emergency Medicine

## 2017-08-21 ENCOUNTER — Encounter (HOSPITAL_COMMUNITY): Payer: Self-pay

## 2017-08-21 DIAGNOSIS — Z87891 Personal history of nicotine dependence: Secondary | ICD-10-CM | POA: Diagnosis not present

## 2017-08-21 DIAGNOSIS — F250 Schizoaffective disorder, bipolar type: Secondary | ICD-10-CM | POA: Insufficient documentation

## 2017-08-21 DIAGNOSIS — F419 Anxiety disorder, unspecified: Secondary | ICD-10-CM

## 2017-08-21 DIAGNOSIS — Z79899 Other long term (current) drug therapy: Secondary | ICD-10-CM | POA: Insufficient documentation

## 2017-08-21 HISTORY — DX: Bipolar disorder, unspecified: F31.9

## 2017-08-21 NOTE — ED Provider Notes (Signed)
[facility] DEPT Provider Note   CSN: [id number] Arrival date & time: 08/21/17  1708     History   Chief Complaint Chief Complaint  Patient presents with  . Shortness of Breath    HPI Franklin A[facility] is a 56 y.o. male.  56 year old male with history of schizoaffective disorder presents with complaint of feeling anxious.  Patient reports that he was at home and "snowed in."  He began to feel anxious since he was "trapped" in his house.  Therefore he called EMS and came to the ED.  He now feels improved after getting out of the house.  He denies chest pain, shortness of breath, nausea, vomiting, fever, or other complaint.   He declines medication at this time to help with his anxiety.    The history is provided by the patient.  Anxiety  This is a chronic problem. The current episode started 6 to 12 hours ago. The problem occurs constantly. The problem has been resolved. Pertinent negatives include no chest pain, no headaches and no shortness of breath. Nothing aggravates the symptoms. Nothing relieves the symptoms. He has tried nothing for the symptoms. The treatment provided no relief.    Past Medical History:  Diagnosis Date  . Manic depression ([place])   . Mental disorder   . Schizoaffective disorder [facility])     Patient Active Problem List   Diagnosis Date Noted  . Schizoaffective disorder, bipolar type ([place]) 12/23/2016    Past Surgical History:  Procedure Laterality Date  . TONSILLECTOMY         Home Medications    Prior to Admission medications   Medication Sig Start Date End Date Taking? Authorizing Provider  divalproex (DEPAKOTE ER) 500 MG 24 hr tablet Take 2 tablets (1,000 mg total) by mouth at bedtime. For mood control 08/03/17   Money, Lowry Ram, FNP  QUEtiapine (SEROQUEL) 100 MG tablet Take 100 mg by mouth at bedtime. 07/19/17   [provider]  QUEtiapine (SEROQUEL) 300 MG tablet Take 1 tablet (300 mg total) by mouth  at bedtime. For mood control Patient not taking: Reported on 08/08/2017 08/03/17   Money, Lowry Ram, FNP    Family History Family History  Problem Relation Age of Onset  . Cancer Other   . Stroke Other     Social History Social History   Tobacco Use  . Smoking status: Former Smoker    Packs/day: 2.00    Types: Cigarettes  . Smokeless tobacco: Never Used  . Tobacco comment: refused  Substance Use Topics  . Alcohol use: No    Frequency: Never    Comment: occasional  . Drug use: No     Allergies   Penicillins   Review of Systems Review of Systems  Respiratory: Negative for shortness of breath.   Cardiovascular: Negative for chest pain.  Neurological: Negative for headaches.  Psychiatric/Behavioral:       Anxious   All other systems reviewed and are negative.    Physical Exam Updated Vital Signs BP 126/62 (BP Location: Left Arm)   Pulse 64   Temp 97.8 F (36.6 C) (Oral)   Resp 20   Ht 5\' 8"  (1.727 m)   Wt 63.5 kg (140 lb)   SpO2 98%   BMI 21.29 kg/m   Physical Exam  Constitutional: He is oriented to person, place, and time. He appears well-developed and well-nourished. No distress.  HENT:  Head: Normocephalic and atraumatic.  Mouth/Throat: Oropharynx is clear and moist.  Eyes:  Conjunctivae and EOM are normal. Pupils are equal, round, and reactive to light.  Neck: Normal range of motion. Neck supple.  Cardiovascular: Normal rate, regular rhythm and normal heart sounds.  Pulmonary/Chest: Effort normal and breath sounds normal. No respiratory distress.  Abdominal: Soft. He exhibits no distension. There is no tenderness.  Musculoskeletal: Normal range of motion. He exhibits no edema or deformity.  Neurological: He is alert and oriented to person, place, and time.  Skin: Skin is warm and dry.  Psychiatric: He has a normal mood and affect.  Nursing note and vitals reviewed.    ED Treatments / Results  Labs (all labs ordered are listed, but only  abnormal results are displayed) Labs Reviewed - No data to display  EKG  EKG Interpretation None       Radiology No results found.  Procedures Procedures (including critical care time)  Medications Ordered in ED Medications - No data to display   Initial Impression / Assessment and Plan / ED Course  I have reviewed the triage vital signs and the nursing notes.  Pertinent labs & imaging results that were available during my care of the patient were reviewed by me and considered in my medical decision making (see chart for details).     MSE complete  History and exam are consistent with anxiety.  Patient now declines acute  treatment for his anxiety. He reports feeling improved.  He appears to be stable and safe for discharge home. He does not meet criteria for IVC. Close FU is advised. Strict return instructions given and understood.   Final Clinical Impressions(s) / ED Diagnoses   Final diagnoses:  Anxiety    ED Discharge Orders    None       Valarie Merino, MD 08/21/17 1940

## 2017-08-21 NOTE — ED Triage Notes (Addendum)
Per EMS- Patient c/o SOB. Patient's room air sats - 96%. Patient states he took an extra pill yesterday. Paatient very vague about symptoms.

## 2017-09-08 ENCOUNTER — Emergency Department (HOSPITAL_COMMUNITY)
Admission: EM | Admit: 2017-09-08 | Discharge: 2017-09-08 | Disposition: A | Discharge status: Other Acute Inpatient Hospital | Payer: Medicare Other | Attending: Emergency Medicine | Admitting: Emergency Medicine

## 2017-09-08 DIAGNOSIS — Z79899 Other long term (current) drug therapy: Secondary | ICD-10-CM | POA: Diagnosis not present

## 2017-09-08 DIAGNOSIS — Z87891 Personal history of nicotine dependence: Secondary | ICD-10-CM | POA: Diagnosis not present

## 2017-09-08 DIAGNOSIS — F250 Schizoaffective disorder, bipolar type: Secondary | ICD-10-CM | POA: Diagnosis not present

## 2017-09-08 DIAGNOSIS — F259 Schizoaffective disorder, unspecified: Secondary | ICD-10-CM | POA: Diagnosis present

## 2017-09-08 DIAGNOSIS — Z0489 Encounter for examination and observation for other specified reasons: Secondary | ICD-10-CM | POA: Diagnosis present

## 2017-09-08 LAB — CBC
HCT: 45.1 % (ref 39.0–52.0)
Hemoglobin: 15.6 g/dL (ref 13.0–17.0)
MCH: 30.9 pg (ref 26.0–34.0)
MCHC: 34.6 g/dL (ref 30.0–36.0)
MCV: 89.3 fL (ref 78.0–100.0)
PLATELETS: 211 10*3/uL (ref 150–400)
RBC: 5.05 MIL/uL (ref 4.22–5.81)
RDW: 13.7 % (ref 11.5–15.5)
WBC: 8.1 10*3/uL (ref 4.0–10.5)

## 2017-09-08 LAB — COMPREHENSIVE METABOLIC PANEL
ALT: 18 U/L (ref 17–63)
AST: 17 U/L (ref 15–41)
Albumin: 4.6 g/dL (ref 3.5–5.0)
Alkaline Phosphatase: 96 U/L (ref 38–126)
Anion gap: 14 (ref 5–15)
BUN: 21 mg/dL — AB (ref 6–20)
CHLORIDE: 105 mmol/L (ref 101–111)
CO2: 20 mmol/L — AB (ref 22–32)
CREATININE: 0.65 mg/dL (ref 0.61–1.24)
Calcium: 9.6 mg/dL (ref 8.9–10.3)
GFR calc Af Amer: >60 mL/min (ref >60)
GLUCOSE: 86 mg/dL (ref 65–99)
Potassium: 3.6 mmol/L (ref 3.5–5.1)
Sodium: 139 mmol/L (ref 135–145)
Total Bilirubin: 2.2 mg/dL — ABNORMAL HIGH (ref 0.3–1.2)
Total Protein: 7.8 g/dL (ref 6.5–8.1)

## 2017-09-08 LAB — ETHANOL: Alcohol, Ethyl (B): <10 mg/dL (ref <10)

## 2017-09-08 MED ORDER — CARBAMAZEPINE ER 200 MG PO TB12
200.0000 mg | ORAL_TABLET | Freq: Two times a day (BID) | ORAL | Status: DC
  Administered 2017-09-08: 200 mg via ORAL
  Filled 2017-09-08: qty 1

## 2017-09-08 MED ORDER — TOBRAMYCIN 0.3 % OP SOLN
2.0000 [drp] | Freq: Once | OPHTHALMIC | Status: AC
  Administered 2017-09-08: 2 [drp] via OPHTHALMIC
  Filled 2017-09-08: qty 5

## 2017-09-08 MED ORDER — QUETIAPINE FUMARATE 300 MG PO TABS: 300.0000 mg | ORAL_TABLET | Freq: Every day | ORAL | Status: DC

## 2017-09-08 MED ORDER — CLONAZEPAM 1 MG PO TABS: 1.0000 mg | ORAL_TABLET | Freq: Every day | ORAL | Status: DC

## 2017-09-08 MED ORDER — LORAZEPAM 0.5 MG PO TABS
0.5000 mg | ORAL_TABLET | Freq: Two times a day (BID) | ORAL | Status: DC
  Administered 2017-09-08: 0.5 mg via ORAL
  Filled 2017-09-08: qty 1

## 2017-09-08 MED ORDER — DIVALPROEX SODIUM ER 500 MG PO TB24: 1000.0000 mg | ORAL_TABLET | Freq: Every day | ORAL | Status: DC

## 2017-09-08 NOTE — Progress Notes (Signed)
CSW spoke with patients mother, Priscilla Finklea [phone], regarding discharge plans. Patient is currently living at home with mom who is his primary caregiver. Mother voices difficulty caring for patient and has been actively searching for an assisted living facility for long-term placement. Mother states [facility] however they have not yet determined on a day when patient could move in and are needing a FL2. Mother voiced concerns with patients teeth multiple times, stating "he cant go into assisted living until his teeth are fixed. He is eating and needs them removed. Once they are removed he will eat"- mother stated this multiple times. Mother stated she did not feel comfortable with patient returning home at this time and stated "I just cant do it anymore!". CSW informed mother that an APS report would be completed if mother refused to pick up patient IF he was medically cleared. Mother voiced understanding.   CSW provided mother phone number for Glyn Ade, nurse liaison for multiple ALF's in the area, mother appreciated number and stated she would contact her ASAP. CSW also reacted out to Ms. Williams.   Kingsley Spittle, [facility] Clinical Social Worker [phone]

## 2017-09-08 NOTE — ED Notes (Signed)
Pt is calm and cooperative. Affect flat, mood depressed. Pt stays in bed. Takes po medications offered to him. Able to make needs known, but does not. Able to attend to ADLs independently.

## 2017-09-08 NOTE — Progress Notes (Signed)
CSW received phone call from [facility]. Pt accepted by [facility] and can be transported there now.   Accepting doctor is Dr. Josie Saunders.  Number for report is [phone] option 1 ([place]).   CSW spoke with counselor. Counselor provided verbal communication to staff about setting up transportation for pt to [place], [place] [facility]  [phone]

## 2017-09-08 NOTE — ED Notes (Signed)
[place] faxed to assessment department at [facility].

## 2017-09-08 NOTE — ED Notes (Signed)
Bed: [facility] Expected date:  Expected time:  Means of arrival:  Comments: Hold for hall B

## 2017-09-08 NOTE — ED Notes (Signed)
Pt transported to [facility] by [employer]. All belongings returned to pt who signed for same. Pt was voluntary and calm and cooperative.

## 2017-09-08 NOTE — ED Notes (Signed)
Bed: [facility] Expected date:  Expected time:  Means of arrival:  Comments: 56 yo psych

## 2017-09-08 NOTE — ED Provider Notes (Signed)
Pt has been accepted for transfer.  Labs stable.  Vitals stable.  Medically clear.   Dorie Rank, MD 09/08/17 [phone]

## 2017-09-08 NOTE — ED Provider Notes (Signed)
[facility] DE[facility] Provider Note   CSN: [id number] Arrival date & time: 09/08/17  1115     History   Chief Complaint Chief Complaint  Patient presents with  . Medical Clearance    HPI [facility] is a 56 y.o. male.  Patient with hx schizoaffective disorder, arrives via ems, parent indicating he just lays on cough and she cannot care for him.  Patient is very poor historian - level 5 caveat. Is noted with bilateral conjunctivitis, but denies other specific symptom or c/o. Unclear if compliant w meds.    The history is provided by the patient and the EMS personnel. The history is limited by the condition of the patient.    Past Medical History:  Diagnosis Date  . Manic depression ([facility])   . Mental disorder   . Schizoaffective disorder [facility])     Patient Active Problem List   Diagnosis Date Noted  . Schizoaffective disorder, bipolar type ([facility]) 12/23/2016    Past Surgical History:  Procedure Laterality Date  . TONSILLECTOMY         Home Medications    Prior to Admission medications   Medication Sig Start Date End Date Taking? Authorizing Provider  QUEtiapine (SEROQUEL) 300 MG tablet Take 1 tablet (300 mg total) by mouth at bedtime. For mood control 08/03/17  Yes Money, Lowry Ram, FNP  divalproex (DEPAKOTE ER) 500 MG 24 hr tablet Take 2 tablets (1,000 mg total) by mouth at bedtime. For mood control Patient not taking: Reported on 09/08/2017 08/03/17   Money, Lowry Ram, FNP    Family History Family History  Problem Relation Age of Onset  . Cancer Other   . Stroke Other     Social History Social History   Tobacco Use  . Smoking status: Former Smoker    Packs/day: 2.00    Types: Cigarettes  . Smokeless tobacco: Never Used  . Tobacco comment: refused  Substance Use Topics  . Alcohol use: No    Frequency: Never    Comment: occasional  . Drug use: No     Allergies   Penicillins   Review of Systems Review of Systems    Unable to perform ROS: Psychiatric disorder  Constitutional: Negative for fever.  level 5 caveat - psychiatric illness.     Physical Exam Updated Vital Signs BP (!) 133/94 (BP Location: Right Arm)   Pulse 80   Temp 98.3 F (36.8 C) (Oral)   Resp 18   SpO2 97%   Physical Exam  Constitutional: He appears well-developed and well-nourished. No distress.  HENT:  Mouth/Throat: Oropharynx is clear and moist.  Eyes: EOM are normal. Pupils are equal, [place], and reactive to light. Right eye exhibits discharge. Left eye exhibits discharge.  bil exudative conjunctivitis.   Neck: Normal range of motion. Neck supple. No tracheal deviation present.  No stiffness or rigidity.   Cardiovascular: Normal rate, regular rhythm, normal heart sounds and intact distal pulses. Exam reveals no gallop and no friction rub.  No murmur heard. Pulmonary/Chest: Effort normal and breath sounds normal. No accessory muscle usage. No respiratory distress.  Abdominal: Soft. Bowel sounds are normal. He exhibits no distension. There is no tenderness.  Musculoskeletal: He exhibits no edema.  Lymphadenopathy:    He has no cervical adenopathy.  Neurological: He is alert.  Awake and alert. Moves bil extremities purposefully.   Skin: Skin is warm and dry. He is not diaphoretic.  Psychiatric: He has a normal mood and affect.  Nursing note  and vitals reviewed.    ED Treatments / Results  Labs (all labs ordered are listed, but only abnormal results are displayed) Results for orders placed or performed during the hospital encounter of 09/08/17  Comprehensive metabolic panel  Result Value Ref Range   Sodium 139 135 - 145 mmol/L   Potassium 3.6 3.5 - 5.1 mmol/L   Chloride 105 101 - 111 mmol/L   CO2 20 (L) 22 - 32 mmol/L   Glucose, Bld 86 65 - 99 mg/dL   BUN 21 (H) 6 - 20 mg/dL   Creatinine, Ser 0.65 0.61 - 1.24 mg/dL   Calcium 9.6 8.9 - 10.3 mg/dL   Total Protein 7.8 6.5 - 8.1 g/dL   Albumin 4.6 3.5 - 5.0 g/dL    AST 17 15 - 41 U/L   ALT 18 17 - 63 U/L   Alkaline Phosphatase 96 38 - 126 U/L   Total Bilirubin 2.2 (H) 0.3 - 1.2 mg/dL   GFR calc non Af Amer >60 >60 mL/min   GFR calc Af Amer >60 >60 mL/min   Anion gap 14 5 - 15  CBC  Result Value Ref Range   WBC 8.1 4.0 - 10.5 K/uL   RBC 5.05 4.22 - 5.81 MIL/uL   Hemoglobin 15.6 13.0 - 17.0 g/dL   HCT 45.1 39.0 - 52.0 %   MCV 89.3 78.0 - 100.0 fL   MCH 30.9 26.0 - 34.0 pg   MCHC 34.6 30.0 - 36.0 g/dL   RDW 13.7 11.5 - 15.5 %   Platelets 211 150 - 400 K/uL  Ethanol  Result Value Ref Range   Alcohol, Ethyl (B) <10 <10 mg/dL    EKG  EKG Interpretation None       Radiology No results found.  Procedures Procedures (including critical care time)  Medications Ordered in ED Medications  tobramycin (TOBREX) 0.3 % ophthalmic solution 2 drop (not administered)     Initial Impression / Assessment and Plan / ED Course  I have reviewed the triage vital signs and the nursing notes.  Pertinent labs & imaging results that were available during my care of the patient were reviewed by me and considered in my medical decision making (see chart for details).  Labs sent.  Conjunctival exudates cleaned. tobrex drops applied.   Reviewed nursing notes and prior charts for additional history.   Social work consulted.   To assess from mental health standpoint/parental concern re needing long term placement for behavioral health related issues, will get behavioral health team evaluation.   Disposition per [facility] team (and SW).     Final Clinical Impressions(s) / ED Diagnoses   Final diagnoses:  None    ED Discharge Orders    None       Lajean Saver, MD 09/08/17 1352

## 2017-09-08 NOTE — BH Assessment (Addendum)
[facility] Assessment Progress Note  Beds available, information sent, decision pending:  [place]   At capacity:  [facility]   Jalene Mullet, [place] Coordinator [phone]

## 2017-09-08 NOTE — BH Assessment (Signed)
Tele Assessment Note   Patient Name: Franklin Ayers MRN: [id number] Referring Physician:  Lajean Saver, MD    Location of Patient: [place]  Location of Provider: [place]  Franklin Ayers is an 56 y.o. male. Franklin Ayers is an 56 y.o. male who was brought to [facility] by the ambulance. Pt states he is voluntary. Pt lives with mother and is able to return once discharged. When asked what brought pt into the hospital pt stated his face needed to be cleaned. Pt states that he has been recently feeling pressured and stressed about dealing with his mental health illness. Pt states that he was recently hospitalized on 08/08/17 at [facility] for his mental health. Pt states that he has a history of schizophrenia and Bipolar. Pt states that his taking psychotropic medications however he does not know which ones. According to previous admission he receives outpatient mental health treatment from [facility] with Dr. Filomena Jungling for monthly medications.  Pt stated that his medications are delivered to his home. Pt denies SI/HI and AVH. Pt stated that he lives with his mother. He states that his only family supports are his mother and sister.   Pt was wearing scrubs and appeared appropriately groomed.  Pt was alert throughout the assessment.  Pt had good eye contact and had normal psychomotor activity.  Pt spoke in a soft voice without pressured speech.    Pt's affect appeared euthymic/normal mood and congruent with stated mood. Pt's thought process was relevant and coherent.  Pt presented with limited insight and judgement.   Disposition: Clinician  Discussed case with, Waylan Boga, DNP who recommends impatient hospitalization.    Diagnosis: (F25.0) Schizoaffective disorder, Bipolar type Past Medical History:  Past Medical History:  Diagnosis Date  . Manic depression ([place])   . Mental disorder   . Schizoaffective disorder [facility])     Past Surgical History:  Procedure Laterality  Date  . TONSILLECTOMY      Family History:  Family History  Problem Relation Age of Onset  . Cancer Other   . Stroke Other     Social History:  reports that he has quit smoking. His smoking use included cigarettes. He smoked 2.00 packs per day. he has never used smokeless tobacco. He reports that he does not drink alcohol or use drugs.  Additional Social History:  Alcohol / Drug Use Pain Medications: see MAR  Prescriptions: see MAR  Over the Counter: see MAR   CIWA: CIWA-Ar BP: (!) 133/94 Pulse Rate: 80 COWS:    PATIENT STRENGTHS: (choose at least two) Average or above average intelligence Supportive family/friends  Allergies:  Allergies  Allergen Reactions  . Penicillins Shortness Of Breath    .Has patient had a PCN reaction causing immediate rash, facial/tongue/throat swelling, SOB or lightheadedness with hypotension: no Has patient had a PCN reaction causing severe rash involving mucus membranes or skin necrosis: yes Has patient had a PCN reaction that required hospitalization: no Has patient had a PCN reaction occurring within the last 10 years: no If all of the above answers are "NO", then may proceed with Cephalosporin use.     Home Medications:  (Not in a hospital admission)  OB/GYN Status:  No LMP for male patient.  General Assessment Data [facility] of Assessment: [facility] TTS Assessment: In system Is this a Tele or Face-to-Face Assessment?: Face-to-Face Is this an Initial Assessment or a Re-assessment for this encounter?: Initial Assessment Marital status: Single Maiden name: NA  Is patient pregnant?: No  Pregnancy Status: No Living Arrangements: Parent(pt reports living with mother ) Can pt return to current living arrangement?: Yes Admission Status: Voluntary Is patient capable of signing voluntary admission?: Yes Referral Source: Self/Family/Friend Insurance type: Medicare   Medical Screening Exam ([place]) Medical Exam completed:  Yes  Crisis Care Plan Living Arrangements: Parent(pt reports living with mother ) Legal Guardian: Other:(none) Name of Psychiatrist: pt states he does not know(Prior admssion: [facility] ) Name of Therapist: none  Education Status Is patient currently in school?: No Current Grade: NA  Highest grade of school patient has completed: 12th Name of school: na Contact person: na  Risk to self with the past 6 months Suicidal Ideation: No Has patient been a risk to self within the past 6 months prior to admission? : No Suicidal Intent: No Has patient had any suicidal intent within the past 6 months prior to admission? : No Is patient at risk for suicide?: No Suicidal Plan?: No Has patient had any suicidal plan within the past 6 months prior to admission? : No Access to Means: No What has been your use of drugs/alcohol within the last 12 months?: na Previous Attempts/Gestures: No How many times?: 0 Other Self Harm Risks: no Triggers for Past Attempts: Other (Comment)(none) Intentional Self Injurious Behavior: None Family Suicide History: No Recent stressful life event(s): Other (Comment)(dealing with MH illness) Persecutory voices/beliefs?: No Depression: No Depression Symptoms: (none) Substance abuse history and/or treatment for substance abuse?: No Suicide prevention information given to non-admitted patients: Not applicable  Risk to Others within the past 6 months Homicidal Ideation: No Does patient have any lifetime risk of violence toward others beyond the six months prior to admission? : No Thoughts of Harm to Others: No Comment - Thoughts of Harm to Others: none Current Homicidal Intent: No Current Homicidal Plan: No Access to Homicidal Means: No Identified Victim: none History of harm to others?: No Assessment of Violence: None Noted Violent Behavior Description: none Does patient have access to weapons?: No Criminal Charges Pending?: No Does patient have a  court date: No Is patient on probation?: No  Psychosis Hallucinations: None noted Delusions: None noted  Mental Status Report Appearance/Hygiene: In scrubs Eye Contact: Good Motor Activity: Freedom of movement Speech: Logical/coherent Level of Consciousness: Alert Mood: (calm) Affect: Appropriate to circumstance Anxiety Level: Minimal Thought Processes: Coherent, Relevant Judgement: Unimpaired Orientation: Person, Place, Time, Situation Obsessive Compulsive Thoughts/Behaviors: None  Cognitive Functioning Concentration: Normal Memory: Recent Intact IQ: Average Insight: Fair Impulse Control: Fair Appetite: Poor Weight Loss: 30(pt reported weight loss of 30 pounds) Weight Gain: 0 Sleep: No Change(pt reports sleeping 41/2 hours a night ) Total Hours of Sleep: 5 Vegetative Symptoms: None     Prior Inpatient Therapy Prior Inpatient Therapy: Yes(Yes) Prior Therapy Dates: 2018 and previous Prior Therapy Facilty/Provider(s): [facility], [place]  Reason for Treatment: Schizoaffective   Prior Outpatient Therapy Prior Outpatient Therapy: Yes Prior Therapy Dates: Earlier 2018 Prior Therapy Facilty/Provider(s): [facility]  Reason for Treatment: Mental Health Does patient have an ACCT team?: No Does patient have Intensive In-House Services?  : No Does patient have [facility] services? : No Does patient have P4CC services?: No  ADL Screening (condition at time of admission) Is the patient deaf or have difficulty hearing?: No Does the patient have difficulty seeing, even when wearing glasses/contacts?: No(pt wears glasses ) Does the patient have difficulty concentrating, remembering, or making decisions?: No Does the patient have difficulty dressing or bathing?: No Does the patient have  difficulty walking or climbing stairs?: No Weakness of Legs: None Weakness of Arms/Hands: None       Abuse/Neglect Assessment (Assessment to be complete while patient is  alone) Abuse/Neglect Assessment Can Be Completed: Yes Physical Abuse: Denies Verbal Abuse: Denies Sexual Abuse: Denies Exploitation of patient/patient's resources: Denies Self-Neglect: Denies          Additional Information 1:1 In Past 12 Months?: No CIRT Risk: No Elopement Risk: No Does patient have medical clearance?: Yes     Disposition: Clinican discussed case with, Waylan Boga, DNP who recommends impatient hospitalization.  Disposition Initial Assessment Completed for this Encounter: Yes Disposition of Patient: Inpatient treatment program Type of inpatient treatment program: Adult  [place], [facility]  Therapeutic Triage Specialist  [phone]   Lynder Parents 09/08/2017 2:24 PM

## 2017-09-08 NOTE — ED Triage Notes (Signed)
Per EMS, pt is coming from home after mom called and stated that pt would not get off the couch and that she could not take care of him anymore. Pt has been on the couch for one week. Pt was discharged from [facility] one month ago and has all the paper prescriptions with him. Per EMS pt is calm and cooperative. Pt has a hx of bipolar and schizophrenia.

## 2017-10-14 ENCOUNTER — Emergency Department (HOSPITAL_COMMUNITY): Payer: Medicare Other

## 2017-10-14 ENCOUNTER — Encounter (HOSPITAL_COMMUNITY): Payer: Self-pay | Admitting: Emergency Medicine

## 2017-10-14 ENCOUNTER — Inpatient Hospital Stay (HOSPITAL_COMMUNITY)
Admission: EM | Admit: 2017-10-14 | Discharge: 2017-11-06 | DRG: 885 | Disposition: A | Discharge status: Skilled Nursing Facility | Payer: Medicare Other | Attending: Family Medicine | Admitting: Family Medicine

## 2017-10-14 DIAGNOSIS — R1030 Lower abdominal pain, unspecified: Secondary | ICD-10-CM | POA: Diagnosis not present

## 2017-10-14 DIAGNOSIS — N401 Enlarged prostate with lower urinary tract symptoms: Secondary | ICD-10-CM | POA: Diagnosis present

## 2017-10-14 DIAGNOSIS — L219 Seborrheic dermatitis, unspecified: Secondary | ICD-10-CM | POA: Diagnosis present

## 2017-10-14 DIAGNOSIS — I959 Hypotension, unspecified: Secondary | ICD-10-CM | POA: Diagnosis not present

## 2017-10-14 DIAGNOSIS — Z681 Body mass index (BMI) 19 or less, adult: Secondary | ICD-10-CM

## 2017-10-14 DIAGNOSIS — R319 Hematuria, unspecified: Secondary | ICD-10-CM | POA: Diagnosis present

## 2017-10-14 DIAGNOSIS — L089 Local infection of the skin and subcutaneous tissue, unspecified: Secondary | ICD-10-CM | POA: Diagnosis not present

## 2017-10-14 DIAGNOSIS — M79602 Pain in left arm: Secondary | ICD-10-CM | POA: Diagnosis not present

## 2017-10-14 DIAGNOSIS — Z9114 Patient's other noncompliance with medication regimen: Secondary | ICD-10-CM

## 2017-10-14 DIAGNOSIS — R32 Unspecified urinary incontinence: Secondary | ICD-10-CM | POA: Diagnosis not present

## 2017-10-14 DIAGNOSIS — N3289 Other specified disorders of bladder: Secondary | ICD-10-CM

## 2017-10-14 DIAGNOSIS — R450 Nervousness: Secondary | ICD-10-CM | POA: Diagnosis not present

## 2017-10-14 DIAGNOSIS — K30 Functional dyspepsia: Secondary | ICD-10-CM | POA: Diagnosis not present

## 2017-10-14 DIAGNOSIS — F250 Schizoaffective disorder, bipolar type: Secondary | ICD-10-CM | POA: Diagnosis present

## 2017-10-14 DIAGNOSIS — Z87891 Personal history of nicotine dependence: Secondary | ICD-10-CM | POA: Diagnosis not present

## 2017-10-14 DIAGNOSIS — R4587 Impulsiveness: Secondary | ICD-10-CM | POA: Diagnosis not present

## 2017-10-14 DIAGNOSIS — E876 Hypokalemia: Secondary | ICD-10-CM

## 2017-10-14 DIAGNOSIS — K219 Gastro-esophageal reflux disease without esophagitis: Secondary | ICD-10-CM | POA: Diagnosis not present

## 2017-10-14 DIAGNOSIS — F202 Catatonic schizophrenia: Principal | ICD-10-CM | POA: Diagnosis present

## 2017-10-14 DIAGNOSIS — Z880 Allergy status to penicillin: Secondary | ICD-10-CM | POA: Diagnosis not present

## 2017-10-14 DIAGNOSIS — R4182 Altered mental status, unspecified: Secondary | ICD-10-CM | POA: Diagnosis not present

## 2017-10-14 DIAGNOSIS — H5710 Ocular pain, unspecified eye: Secondary | ICD-10-CM | POA: Diagnosis not present

## 2017-10-14 DIAGNOSIS — N390 Urinary tract infection, site not specified: Secondary | ICD-10-CM | POA: Diagnosis present

## 2017-10-14 DIAGNOSIS — N400 Enlarged prostate without lower urinary tract symptoms: Secondary | ICD-10-CM | POA: Diagnosis not present

## 2017-10-14 DIAGNOSIS — K5900 Constipation, unspecified: Secondary | ICD-10-CM | POA: Diagnosis not present

## 2017-10-14 DIAGNOSIS — R338 Other retention of urine: Secondary | ICD-10-CM | POA: Diagnosis present

## 2017-10-14 DIAGNOSIS — B957 Other staphylococcus as the cause of diseases classified elsewhere: Secondary | ICD-10-CM | POA: Diagnosis present

## 2017-10-14 DIAGNOSIS — F259 Schizoaffective disorder, unspecified: Secondary | ICD-10-CM | POA: Diagnosis not present

## 2017-10-14 DIAGNOSIS — E440 Moderate protein-calorie malnutrition: Secondary | ICD-10-CM | POA: Diagnosis present

## 2017-10-14 DIAGNOSIS — Z823 Family history of stroke: Secondary | ICD-10-CM | POA: Diagnosis not present

## 2017-10-14 DIAGNOSIS — F419 Anxiety disorder, unspecified: Secondary | ICD-10-CM | POA: Diagnosis not present

## 2017-10-14 DIAGNOSIS — F251 Schizoaffective disorder, depressive type: Secondary | ICD-10-CM | POA: Diagnosis not present

## 2017-10-14 DIAGNOSIS — Z79899 Other long term (current) drug therapy: Secondary | ICD-10-CM

## 2017-10-14 DIAGNOSIS — D649 Anemia, unspecified: Secondary | ICD-10-CM

## 2017-10-14 LAB — URINALYSIS, ROUTINE W REFLEX MICROSCOPIC
BILIRUBIN URINE: NEGATIVE
GLUCOSE, UA: NEGATIVE mg/dL
Ketones, ur: 20 mg/dL — AB
NITRITE: POSITIVE — AB
PH: 6 (ref 5.0–8.0)
Protein, ur: 100 mg/dL — AB
Specific Gravity, Urine: 1.023 (ref 1.005–1.030)
Squamous Epithelial / LPF: NONE SEEN

## 2017-10-14 LAB — I-STAT CHEM 8, ED
BUN: 14 mg/dL (ref 6–20)
CHLORIDE: 109 mmol/L (ref 101–111)
Calcium, Ion: 1.18 mmol/L (ref 1.15–1.40)
Creatinine, Ser: 0.5 mg/dL — ABNORMAL LOW (ref 0.61–1.24)
Glucose, Bld: 91 mg/dL (ref 65–99)
HCT: 39 % (ref 39.0–52.0)
Hemoglobin: 13.3 g/dL (ref 13.0–17.0)
POTASSIUM: 3.3 mmol/L — AB (ref 3.5–5.1)
SODIUM: 144 mmol/L (ref 135–145)
TCO2: 22 mmol/L (ref 22–32)

## 2017-10-14 LAB — CBC
HEMATOCRIT: 39.5 % (ref 39.0–52.0)
Hemoglobin: 13.3 g/dL (ref 13.0–17.0)
MCH: 30.1 pg (ref 26.0–34.0)
MCHC: 33.7 g/dL (ref 30.0–36.0)
MCV: 89.4 fL (ref 78.0–100.0)
PLATELETS: 187 10*3/uL (ref 150–400)
RBC: 4.42 MIL/uL (ref 4.22–5.81)
RDW: 15.2 % (ref 11.5–15.5)
WBC: 7.2 10*3/uL (ref 4.0–10.5)

## 2017-10-14 LAB — I-STAT TROPONIN, ED: TROPONIN I, POC: 0 ng/mL (ref 0.00–0.08)

## 2017-10-14 LAB — ETHANOL

## 2017-10-14 LAB — CREATININE, SERUM
Creatinine, Ser: 0.79 mg/dL (ref 0.61–1.24)
GFR calc Af Amer: >60 mL/min (ref >60)
GFR calc non Af Amer: >60 mL/min (ref >60)

## 2017-10-14 LAB — RAPID URINE DRUG SCREEN, HOSP PERFORMED
Amphetamines: NOT DETECTED
Barbiturates: NOT DETECTED
Benzodiazepines: NOT DETECTED
Cocaine: NOT DETECTED
OPIATES: NOT DETECTED
Tetrahydrocannabinol: NOT DETECTED

## 2017-10-14 LAB — CBC WITH DIFFERENTIAL/PLATELET
BASOS ABS: 0.1 10*3/uL (ref 0.0–0.1)
Basophils Relative: 1 %
Eosinophils Absolute: 0 10*3/uL (ref 0.0–0.7)
Eosinophils Relative: 0 %
HEMATOCRIT: 40.2 % (ref 39.0–52.0)
HEMOGLOBIN: 13.7 g/dL (ref 13.0–17.0)
LYMPHS PCT: 20 %
Lymphs Abs: 1.8 10*3/uL (ref 0.7–4.0)
MCH: 30.6 pg (ref 26.0–34.0)
MCHC: 34.1 g/dL (ref 30.0–36.0)
MCV: 89.9 fL (ref 78.0–100.0)
MONO ABS: 0.6 10*3/uL (ref 0.1–1.0)
MONOS PCT: 7 %
NEUTROS ABS: 6.6 10*3/uL (ref 1.7–7.7)
Neutrophils Relative %: 72 %
Platelets: 221 10*3/uL (ref 150–400)
RBC: 4.47 MIL/uL (ref 4.22–5.81)
RDW: 15.2 % (ref 11.5–15.5)
WBC: 9.1 10*3/uL (ref 4.0–10.5)

## 2017-10-14 LAB — AMMONIA
Ammonia: 17 umol/L (ref 9–35)
Ammonia: 30 umol/L (ref 9–35)

## 2017-10-14 LAB — BASIC METABOLIC PANEL
ANION GAP: 14 (ref 5–15)
BUN: 13 mg/dL (ref 6–20)
CHLORIDE: 108 mmol/L (ref 101–111)
CO2: 21 mmol/L — ABNORMAL LOW (ref 22–32)
Calcium: 9.4 mg/dL (ref 8.9–10.3)
Creatinine, Ser: 0.64 mg/dL (ref 0.61–1.24)
GFR calc non Af Amer: >60 mL/min (ref >60)
Glucose, Bld: 102 mg/dL — ABNORMAL HIGH (ref 65–99)
Potassium: 3.5 mmol/L (ref 3.5–5.1)
Sodium: 143 mmol/L (ref 135–145)

## 2017-10-14 LAB — VITAMIN B12: Vitamin B-12: 317 pg/mL (ref 180–914)

## 2017-10-14 LAB — HEPATIC FUNCTION PANEL
ALBUMIN: 3.6 g/dL (ref 3.5–5.0)
ALK PHOS: 71 U/L (ref 38–126)
ALT: 18 U/L (ref 17–63)
AST: 20 U/L (ref 15–41)
BILIRUBIN TOTAL: 1 mg/dL (ref 0.3–1.2)
Bilirubin, Direct: 0.1 mg/dL (ref 0.1–0.5)
Indirect Bilirubin: 0.9 mg/dL (ref 0.3–0.9)
Total Protein: 7.1 g/dL (ref 6.5–8.1)

## 2017-10-14 LAB — MAGNESIUM: Magnesium: 2 mg/dL (ref 1.7–2.4)

## 2017-10-14 LAB — SALICYLATE LEVEL: Salicylate Lvl: <7 mg/dL (ref 2.8–30.0)

## 2017-10-14 LAB — TSH: TSH: 0.883 u[IU]/mL (ref 0.350–4.500)

## 2017-10-14 LAB — CBG MONITORING, ED: GLUCOSE-CAPILLARY: 108 mg/dL — AB (ref 65–99)

## 2017-10-14 LAB — ACETAMINOPHEN LEVEL

## 2017-10-14 LAB — VALPROIC ACID LEVEL: Valproic Acid Lvl: <10 ug/mL — ABNORMAL LOW (ref 50.0–100.0)

## 2017-10-14 MED ORDER — ALUM & MAG HYDROXIDE-SIMETH 200-200-20 MG/5ML PO SUSP: 30.0000 mL | Freq: Four times a day (QID) | ORAL | Status: DC | PRN

## 2017-10-14 MED ORDER — ACETAMINOPHEN 650 MG RE SUPP: 650.0000 mg | Freq: Four times a day (QID) | RECTAL | Status: DC | PRN

## 2017-10-14 MED ORDER — ONDANSETRON HCL 4 MG/2ML IJ SOLN: 4.0000 mg | Freq: Four times a day (QID) | INTRAMUSCULAR | Status: DC | PRN

## 2017-10-14 MED ORDER — POLYETHYLENE GLYCOL 3350 17 G PO PACK
17.0000 g | PACK | Freq: Every day | ORAL | Status: DC | PRN
  Administered 2017-10-18 – 2017-10-28 (×3): 17 g via ORAL
  Filled 2017-10-14 (×3): qty 1

## 2017-10-14 MED ORDER — ENOXAPARIN SODIUM 40 MG/0.4ML ~~LOC~~ SOLN
40.0000 mg | SUBCUTANEOUS | Status: DC
  Administered 2017-10-14 – 2017-11-05 (×23): 40 mg via SUBCUTANEOUS
  Filled 2017-10-14 (×23): qty 0.4

## 2017-10-14 MED ORDER — IBUPROFEN 600 MG PO TABS: 600.0000 mg | ORAL_TABLET | Freq: Three times a day (TID) | ORAL | Status: DC | PRN

## 2017-10-14 MED ORDER — SULFAMETHOXAZOLE-TRIMETHOPRIM 800-160 MG PO TABS
1.0000 | ORAL_TABLET | Freq: Two times a day (BID) | ORAL | Status: AC
  Administered 2017-10-14 – 2017-10-20 (×13): 1 via ORAL
  Filled 2017-10-14 (×14): qty 1

## 2017-10-14 MED ORDER — ONDANSETRON HCL 4 MG PO TABS: 4.0000 mg | ORAL_TABLET | Freq: Three times a day (TID) | ORAL | Status: DC | PRN

## 2017-10-14 MED ORDER — DIVALPROEX SODIUM ER 500 MG PO TB24
1000.0000 mg | ORAL_TABLET | Freq: Every day | ORAL | Status: DC
  Administered 2017-10-14 – 2017-10-23 (×9): 1000 mg via ORAL
  Filled 2017-10-14 (×11): qty 2

## 2017-10-14 MED ORDER — QUETIAPINE FUMARATE 300 MG PO TABS
300.0000 mg | ORAL_TABLET | Freq: Every day | ORAL | Status: DC
  Administered 2017-10-14: 300 mg via ORAL
  Filled 2017-10-14: qty 1

## 2017-10-14 MED ORDER — NICOTINE 21 MG/24HR TD PT24
21.0000 mg | MEDICATED_PATCH | Freq: Every day | TRANSDERMAL | Status: DC
  Filled 2017-10-14: qty 1

## 2017-10-14 MED ORDER — ONDANSETRON HCL 4 MG PO TABS: 4.0000 mg | ORAL_TABLET | Freq: Four times a day (QID) | ORAL | Status: DC | PRN

## 2017-10-14 MED ORDER — SODIUM CHLORIDE 0.9 % IV SOLN: 250.0000 mL | INTRAVENOUS | Status: DC | PRN

## 2017-10-14 MED ORDER — SODIUM CHLORIDE 0.9% FLUSH
3.0000 mL | Freq: Two times a day (BID) | INTRAVENOUS | Status: DC
  Administered 2017-10-14 – 2017-10-30 (×30): 3 mL via INTRAVENOUS

## 2017-10-14 MED ORDER — SODIUM CHLORIDE 0.9% FLUSH: 3.0000 mL | INTRAVENOUS | Status: DC | PRN

## 2017-10-14 MED ORDER — DIVALPROEX SODIUM ER 500 MG PO TB24: 500.0000 mg | ORAL_TABLET | ORAL | Status: DC

## 2017-10-14 MED ORDER — ZOLPIDEM TARTRATE 5 MG PO TABS: 5.0000 mg | ORAL_TABLET | Freq: Every evening | ORAL | Status: DC | PRN

## 2017-10-14 MED ORDER — DIVALPROEX SODIUM ER 500 MG PO TB24
500.0000 mg | ORAL_TABLET | ORAL | Status: AC
  Administered 2017-10-15 – 2017-11-05 (×21): 500 mg via ORAL
  Filled 2017-10-14 (×21): qty 1

## 2017-10-14 MED ORDER — CLONAZEPAM 1 MG PO TABS: 1.0000 mg | ORAL_TABLET | Freq: Every day | ORAL | Status: DC

## 2017-10-14 MED ORDER — ACETAMINOPHEN 325 MG PO TABS
650.0000 mg | ORAL_TABLET | Freq: Four times a day (QID) | ORAL | Status: DC | PRN
  Administered 2017-10-18 – 2017-11-05 (×3): 650 mg via ORAL
  Filled 2017-10-14 (×4): qty 2

## 2017-10-14 NOTE — ED Notes (Signed)
Attempted report x 1; name and call back number provided 

## 2017-10-14 NOTE — ED Notes (Signed)
Ordered mechanical soft tray
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
## 2017-10-14 NOTE — ED Provider Notes (Signed)
Medical screening examination/treatment/procedure(s) were conducted as a shared visit with non-physician practitioner(s) and myself.  I personally evaluated the patient during the encounter.   EKG Interpretation  Date/Time:  Saturday October 14 2017 11:01:06 EST Ventricular Rate:  62 PR Interval:    QRS Duration: 93 QT Interval:  426 QTC Calculation: 433 R Axis:   68 Text Interpretation:  Sinus rhythm RSR' in V1 or V2, probably normal variant Minimal ST depression, inferior leads Confirmed by Lacretia Leigh ([id number]) on 10/14/2017 12:23:45 PM       57 year old male with history of schizoaffective disorder presents with altered mental status.  Patient when asked questions has repetitive comments.  He does appear to be responding to internal stimuli.  Head CT is negative for acute process here.  Has a normal ammonia.  Medical screening labs are pending and anticipate behavior health consult     Lacretia Leigh, MD 10/14/17 1224

## 2017-10-14 NOTE — ED Notes (Signed)
Pt transported to CT ?

## 2017-10-14 NOTE — Progress Notes (Signed)
NURSING PROGRESS NOTE  Franklin Ayers [id number] Admission Data: 10/14/2017 6:32 PM Attending Provider: Lind Covert, MD OYD:XAJOIN, Holli Humbles, FNP Code Status: full   Franklin Ayers is a 57 y.o. male patient admitted from ED:  --No complaints of shortness of breath.  -No complaints of chest pain.   Blood pressure 125/69, pulse 77, temperature 97.7 F (36.5 C), temperature source Oral, resp. rate 20, SpO2 100 %.   IV Fluids:  IV in place, occlusive dsg intact without redness.  Allergies:  Penicillins  Past Medical History:   has a past medical history of Manic depression ([place]), Mental disorder, and Schizoaffective disorder ([place]).  Past Surgical History:   has a past surgical history that includes Tonsillectomy.  Social History:   reports that he has quit smoking. His smoking use included cigarettes. He smoked 2.00 packs per day. he has never used smokeless tobacco. He reports that he does not drink alcohol or use drugs.   Patient/Family orientated to room. Information packet given to patient/family. Admission inpatient armband information verified with patient/family to include name and date of birth and placed on patient arm. Side rails up x 2, fall assessment and education completed with patient/family. Patient/family able to verbalize understanding of risk associated with falls and verbalized understanding to call for assistance before getting out of bed. Call light within reach. Patient/family able to voice and demonstrate understanding of unit orientation instructions.    Patient very anxious, unable to speak to give name or any other responses.  Provider text paged to make aware.   Will continue to evaluate and treat per MD orders.

## 2017-10-14 NOTE — ED Notes (Signed)
Introduced self to patient for first time, able to identify self, selectively answering questions, wringing hands, agreed to take bactrim but refused nicotine patch (denies tobacco use)

## 2017-10-14 NOTE — ED Provider Notes (Signed)
[place] EMERGENCY DEPARTMENT Provider Note   CSN: [id number] Arrival date & time: 10/14/17  1011     History   Chief Complaint Chief Complaint  Patient presents with  . Altered Mental Status    HPI   Blood pressure 117/78, pulse 67, temperature 98.6 F (37 C), temperature source Rectal, resp. rate 16, SpO2 97 %.  Franklin Ayers is a 57 y.o. male brought in by EMS for altered mental status, he was found outside on his porch this morning by his elderly mother, he was naked.  It appears that he defecated in the kitchen as well.  Mother saw him last night and he was in his normal state of health.  Past medical history significant for schizoaffective disorder.  Patient grossly altered, level 5 caveat secondary to altered mental status.   Past Medical History:  Diagnosis Date  . Manic depression ([place])   . Mental disorder   . Schizoaffective disorder [facility])     Patient Active Problem List   Diagnosis Date Noted  . Schizoaffective disorder, bipolar type ([place]) 12/23/2016    Past Surgical History:  Procedure Laterality Date  . TONSILLECTOMY         Home Medications    Prior to Admission medications   Medication Sig Start Date End Date Taking? Authorizing Provider  clonazePAM (KLONOPIN) 1 MG tablet Take 1 mg by mouth at bedtime.    [provider]  divalproex (DEPAKOTE ER) 500 MG 24 hr tablet Take 2 tablets (1,000 mg total) by mouth at bedtime. For mood control Patient not taking: Reported on 09/08/2017 08/03/17   Money, Lowry Ram, FNP  QUEtiapine (SEROQUEL) 300 MG tablet Take 1 tablet (300 mg total) by mouth at bedtime. For mood control 08/03/17   Money, Lowry Ram, FNP    Family History Family History  Problem Relation Age of Onset  . Cancer Other   . Stroke Other     Social History Social History   Tobacco Use  . Smoking status: Former Smoker    Packs/day: 2.00    Types: Cigarettes  . Smokeless tobacco: Never Used  . Tobacco  comment: refused  Substance Use Topics  . Alcohol use: No    Frequency: Never    Comment: occasional  . Drug use: No     Allergies   Penicillins   Review of Systems Review of Systems  Unable to perform ROS: Mental status change     Physical Exam Updated Vital Signs BP 108/86   Pulse 74   Temp 98.6 F (37 C) (Rectal)   Resp (!) 21   SpO2 100%   Physical Exam  Constitutional: He appears well-developed and well-nourished. No distress.  HENT:  Head: Normocephalic and atraumatic.  Mouth/Throat: Oropharynx is clear and moist.  MMM  Crusting to bilateral eyelashes.  No significant conjunctival injection.  Eyes: Conjunctivae and EOM are normal. Pupils are equal, round, and reactive to light.  Neck: Normal range of motion.  Rigid c-collar in place  Cardiovascular: Normal rate, regular rhythm and intact distal pulses.  Pulmonary/Chest: Effort normal and breath sounds normal. No stridor. No respiratory distress. He has no wheezes. He has no rales. He exhibits no tenderness.  Abdominal: Soft. He exhibits no distension and no mass. There is no tenderness. There is no rebound and no guarding. No hernia.  Musculoskeletal: Normal range of motion.  Grip strength 3 out of 5 bilaterally  Neurological: He is alert. He displays normal reflexes. No cranial nerve deficit  or sensory deficit. He exhibits normal muscle tone. Coordination normal.  Moves all extremities, positive gag reflex, has difficulty following simple commands.  Patient does not know his name, this date, the year, when asked about any medications or medical problems he says I am "hyper something."  Skin: He is not diaphoretic.  Psychiatric: He has a normal mood and affect.  Nursing note and vitals reviewed.    ED Treatments / Results  Labs (all labs ordered are listed, but only abnormal results are displayed) Labs Reviewed  BASIC METABOLIC PANEL - Abnormal; Notable for the following components:      Result Value     CO2 21 (*)    Glucose, Bld 102 (*)    All other components within normal limits  ACETAMINOPHEN LEVEL - Abnormal; Notable for the following components:   Acetaminophen (Tylenol), Serum <10 (*)    All other components within normal limits  VALPROIC ACID LEVEL - Abnormal; Notable for the following components:   Valproic Acid Lvl <10 (*)    All other components within normal limits  URINALYSIS, ROUTINE W REFLEX MICROSCOPIC - Abnormal; Notable for the following components:   Color, Urine AMBER (*)    APPearance CLOUDY (*)    Hgb urine dipstick MODERATE (*)    Ketones, ur 20 (*)    Protein, ur 100 (*)    Nitrite POSITIVE (*)    Leukocytes, UA LARGE (*)    Bacteria, UA RARE (*)    All other components within normal limits  CBG MONITORING, ED - Abnormal; Notable for the following components:   Glucose-Capillary 108 (*)    All other components within normal limits  I-STAT CHEM 8, ED - Abnormal; Notable for the following components:   Potassium 3.3 (*)    Creatinine, Ser 0.50 (*)    All other components within normal limits  URINE CULTURE  AMMONIA  ETHANOL  RAPID URINE DRUG SCREEN, HOSP PERFORMED  HEPATIC FUNCTION PANEL  SALICYLATE LEVEL  CBC WITH DIFFERENTIAL/PLATELET  MAGNESIUM  I-STAT TROPONIN, ED  GC/CHLAMYDIA PROBE AMP ([place]) NOT AT [facility]    EKG  EKG Interpretation  Date/Time:  Saturday October 14 2017 11:01:06 EST Ventricular Rate:  62 PR Interval:    QRS Duration: 93 QT Interval:  426 QTC Calculation: 433 R Axis:   68 Text Interpretation:  Sinus rhythm RSR' in V1 or V2, probably normal variant Minimal ST depression, inferior leads Confirmed by Lacretia Leigh ([id number]) on 10/14/2017 12:23:45 PM       Radiology Ct Head Wo Contrast  Result Date: 10/14/2017 CLINICAL DATA:  Polytrauma, Pt was found laying on his mother's porch, not known how long he was outside. Would not respond to EMS, In ED he is alert but not answering questions appropriately EXAM: CT HEAD  WITHOUT CONTRAST CT CERVICAL SPINE WITHOUT CONTRAST TECHNIQUE: Multidetector CT imaging of the head and cervical spine was performed following the standard protocol without intravenous contrast. Multiplanar CT image reconstructions of the cervical spine were also generated. COMPARISON:  03/16/2016 FINDINGS: CT HEAD FINDINGS Brain: No evidence of acute infarction, hemorrhage, hydrocephalus, extra-axial collection or mass lesion/mass effect. Vascular: No hyperdense vessel or unexpected calcification. Skull: No osseous abnormality. Sinuses/Orbits: Visualized paranasal sinuses are clear. Visualized mastoid sinuses are clear. Visualized orbits demonstrate no focal abnormality. Other: Mild left parietal scalp soft tissue swelling likely reflecting a contusion. CT CERVICAL SPINE FINDINGS Alignment: Normal. Skull base and vertebrae: No acute fracture. No primary bone lesion or focal pathologic process. Soft tissues and  spinal canal: No prevertebral fluid or swelling. No visible canal hematoma. Disc levels: Mild degenerative disc disease with disc height loss at C5-6. Broad-based disc osteophyte complex at C5-6 with bilateral uncovertebral degenerative changes. No significant foraminal stenosis. Anterior bridging osteophyte at C4-5. Upper chest: Lung apices are clear. Other: Small sebaceous cyst in the upper posterior neck soft tissues. No other fluid collection or hematoma. IMPRESSION: 1. No acute intracranial pathology. 2. No acute osseous injury of the cervical spine. 3. Mild cervical spine spondylosis as described above. Electronically Signed   By: Kathreen Devoid   On: 10/14/2017 11:34   Ct Cervical Spine Wo Contrast  Result Date: 10/14/2017 CLINICAL DATA:  Polytrauma, Pt was found laying on his mother's porch, not known how long he was outside. Would not respond to EMS, In ED he is alert but not answering questions appropriately EXAM: CT HEAD WITHOUT CONTRAST CT CERVICAL SPINE WITHOUT CONTRAST TECHNIQUE: Multidetector  CT imaging of the head and cervical spine was performed following the standard protocol without intravenous contrast. Multiplanar CT image reconstructions of the cervical spine were also generated. COMPARISON:  03/16/2016 FINDINGS: CT HEAD FINDINGS Brain: No evidence of acute infarction, hemorrhage, hydrocephalus, extra-axial collection or mass lesion/mass effect. Vascular: No hyperdense vessel or unexpected calcification. Skull: No osseous abnormality. Sinuses/Orbits: Visualized paranasal sinuses are clear. Visualized mastoid sinuses are clear. Visualized orbits demonstrate no focal abnormality. Other: Mild left parietal scalp soft tissue swelling likely reflecting a contusion. CT CERVICAL SPINE FINDINGS Alignment: Normal. Skull base and vertebrae: No acute fracture. No primary bone lesion or focal pathologic process. Soft tissues and spinal canal: No prevertebral fluid or swelling. No visible canal hematoma. Disc levels: Mild degenerative disc disease with disc height loss at C5-6. Broad-based disc osteophyte complex at C5-6 with bilateral uncovertebral degenerative changes. No significant foraminal stenosis. Anterior bridging osteophyte at C4-5. Upper chest: Lung apices are clear. Other: Small sebaceous cyst in the upper posterior neck soft tissues. No other fluid collection or hematoma. IMPRESSION: 1. No acute intracranial pathology. 2. No acute osseous injury of the cervical spine. 3. Mild cervical spine spondylosis as described above. Electronically Signed   By: Kathreen Devoid   On: 10/14/2017 11:34    Procedures Procedures (including critical care time)  Medications Ordered in ED Medications  ibuprofen (ADVIL,MOTRIN) tablet 600 mg (not administered)  zolpidem (AMBIEN) tablet 5 mg (not administered)  ondansetron (ZOFRAN) tablet 4 mg (not administered)  alum & mag hydroxide-simeth (MAALOX/MYLANTA) 200-200-20 MG/5ML suspension 30 mL (not administered)  nicotine (NICODERM CQ - dosed in mg/24 hours)  patch 21 mg (not administered)  clonazePAM (KLONOPIN) tablet 1 mg (not administered)  QUEtiapine (SEROQUEL) tablet 300 mg (not administered)  sulfamethoxazole-trimethoprim (BACTRIM DS,SEPTRA DS) 800-160 MG per tablet 1 tablet (not administered)     Initial Impression / Assessment and Plan / ED Course  I have reviewed the triage vital signs and the nursing notes.  Pertinent labs & imaging results that were available during my care of the patient were reviewed by me and considered in my medical decision making (see chart for details).     Vitals:   10/14/17 1300 10/14/17 1315 10/14/17 1330 10/14/17 1345  BP: 105/76 107/73 104/74 108/86  Pulse: 69 62 92 74  Resp: 19 16 18  (!) 21  Temp:      TempSrc:      SpO2: 92% 100% 98% 100%    Medications  ibuprofen (ADVIL,MOTRIN) tablet 600 mg (not administered)  zolpidem (AMBIEN) tablet 5 mg (not administered)  ondansetron (  ZOFRAN) tablet 4 mg (not administered)  alum & mag hydroxide-simeth (MAALOX/MYLANTA) 200-200-20 MG/5ML suspension 30 mL (not administered)  nicotine (NICODERM CQ - dosed in mg/24 hours) patch 21 mg (not administered)  clonazePAM (KLONOPIN) tablet 1 mg (not administered)  QUEtiapine (SEROQUEL) tablet 300 mg (not administered)  sulfamethoxazole-trimethoprim (BACTRIM DS,SEPTRA DS) 800-160 MG per tablet 1 tablet (not administered)    Franklin Ayers is 57 y.o. male presenting with altered mental status, he was found outside on the porch naked this morning, it does appear that he defecated on his mother's kitchen floor.  He was seen normal yesterday evening.  Head and cervical spine CT negative, blood work reassuring with no significant abnormality, mild hypokalemia with normal magnesium.  Urinalysis consistent with infection.  Urine culture and GC chlamydia added.  Will treat for complicated UTI with Bactrim twice daily for 7 days. Patient afebrile, vital signs normal, I do not think he is septic.  Will need admission,  patient's primary care is no valid health.  Unassigned admission to family practice.  Final Clinical Impressions(s) / ED Diagnoses   Final diagnoses:  Urinary tract infection with hematuria, site unspecified  Altered mental status, unspecified altered mental status type    ED Discharge Orders    None       Monico Blitz, [place] 10/14/17 1453

## 2017-10-14 NOTE — H&P (Signed)
[facility] Admission History and Physical Service Pager: [phone]  Patient name: Franklin Ayers Medical record number: [id number] Date of birth: 10-23-1960 Age: 57 y.o. Gender: male  Primary Care Provider: Barrie Lyme, FNP Consultants: Psychiatry Code Status: Unable to discuss- will put as full code for now  Chief Complaint: "I feel too small"  Assessment and Plan: Franklin Ayers is a 57 y.o. male presenting with altered mental status and a UTI. PMH is significant for schizoaffective disorder.  Altered Mental Status: Likely acute psychosis. Patient did not take his psych medications last night and mother does not know if he takes them regularly (supposed to be taking Depakote and Seroquel). Depakote level was <10 on arrival to the ED. Acute depakote withdrawal is also a possible contributor. UTI also likely contributing. No signs of other sources of infection such as meningitis, as patient is able to move his neck without pain. No signs of head trauma and CT head was negative, so intracranial abnormality unlikely. All labs are normal, so electrolyte abnormalities not contributing. Per mother, there are no substances or medications in the house besides the medications that patient takes, so think substance toxicity/withdrawal is unlikely. UDS, ethyl alcohol, tylenol, and salicylate levels all negative in the ED. Stroke unlikely as patient does not have any gross deficits, but unable to complete full neuro exam. In the ED, patient oriented to person only. - Admit to med-surg under inpatient status, attending Dr. Erin Hearing. - Psychiatry consult placed and they will see him tomorrow - Continue home medications- Depakote 500mg  in the AM and 1000mg  in the PM and Seroquel 300mg  qhs - Check HIV, RPR, Vitamin B12, Folate, TSH, and ammonia to rule out other causes of AMS - Will monitor mental status closely - Think patient would benefit from inpatient psych  UTI: UA  with moderate Hgb, large LE, positive nitrites. No signs of sepsis and vitals are all within normal limits. No CVA tenderness to suggest pyelonephritis. No abdominal pain to suggest nephrolithiasis. Patient and mother unable to answer if he is urinating like normal. - Patient with Penicillin allergy (shortness of breath) and risk of QT prolongation with concomitant use of fluoroquinolones and high dose Seroquel, so will continue Bactrim 800-160mg  bid x 7 days - Urine culture pending - Strict I/O - If he has urinary retention, would need prostate exam and likely renal US, otherwise do not think he needs further work-up at this time.  Schizoaffective Disorder: Uncontrolled. Follows with Dr. Vernon Prey (psychiatrist) at [facility]. Also sees a therapist at [facility]. Does not seem to take his medications as prescribed. - Continue home medications- Depakote 500mg  in the AM and 1000mg  in the PM and Seroquel 300mg  qhs - Psychiatry consult as above - If patient is still here on Monday, would consider touching base with patient's psychiatrist  FEN/GI: regular diet Prophylaxis: Lovenox  Disposition: Likely needs inpatient psych placement for acute stabilization.   History of Present Illness:  Franklin Ayers is a 57 y.o. male presenting with altered mental status. History is obtained from mother over the phone, as patient is unable to answer any questions. He was normal last night when his mother went to bed around 8:30PM. He never took his medications last night because they were still sitting on the table. This morning, she came downstairs at 9:30am. She found him laying on the back patio outside in the cold. His pants were down and his rear end was showing. He was hardly  wearing any clothes. He was awake and mumbling. Mom also noted that he had a bowel movement on the coffee table. She then called EMS because it was so cold outside. She states this has never happened before. He has  been acting normally recently, although mom did notice that he seemed a little depressed yesterday because he was talking about his deceased father and uncle. Over the last couple of months, he has not been eating well. This is mostly because he lost his dentures. Mom has been giving him soft foods like bananas and muffins. Mom said he has not complained of any pain over the last couple of days. She states she has no idea if he has been urinating like normal, as she does not keep track of this. She thinks he is having normal bowel movements. No recent changes in any medications. She states she cannot take care of him at home anymore.  In the ED, vitals were within normal limits. CBC and BMP were unremarkable. UA showed moderate Hgb, large LE, positive nitrites, TNTC WBC, TNTC RBC. UDS, ethyl alcohol, tylenol level, and salicylate levels were all negative. Depakote level was <10. CT head and c-spine were unremarkable. Patient was started on Bactrim for UTI. He was admitted for further management.  Review Of Systems: Per HPI with the following additions: Unable to obtain, as patient does not answer questions appropriately.   Patient Active Problem List   Diagnosis Date Noted  . Schizoaffective disorder, bipolar type ([place]) 12/23/2016    Past Medical History: Past Medical History:  Diagnosis Date  . Manic depression ([place])   . Mental disorder   . Schizoaffective disorder [facility])     Past Surgical History: Past Surgical History:  Procedure Laterality Date  . TONSILLECTOMY      Social History: Social History   Tobacco Use  . Smoking status: Former Smoker    Packs/day: 2.00    Types: Cigarettes  . Smokeless tobacco: Never Used  . Tobacco comment: refused  Substance Use Topics  . Alcohol use: No    Frequency: Never    Comment: occasional  . Drug use: No   Additional social history: Lives at home with his mother. Was previously in a group home for 10 years. Please also refer to relevant  sections of EMR.  Family History: Family History  Problem Relation Age of Onset  . Cancer Other   . Stroke Other     Allergies and Medications: Allergies  Allergen Reactions  . Penicillins Shortness Of Breath    .Has patient had a PCN reaction causing immediate rash, facial/tongue/throat swelling, SOB or lightheadedness with hypotension: no Has patient had a PCN reaction causing severe rash involving mucus membranes or skin necrosis: yes Has patient had a PCN reaction that required hospitalization: no Has patient had a PCN reaction occurring within the last 10 years: no If all of the above answers are "NO", then may proceed with Cephalosporin use.    No current facility-administered medications on file prior to encounter.    Current Outpatient Medications on File Prior to Encounter  Medication Sig Dispense Refill  . clonazePAM (KLONOPIN) 1 MG tablet Take 1 mg by mouth at bedtime.    . divalproex (DEPAKOTE ER) 500 MG 24 hr tablet Take 2 tablets (1,000 mg total) by mouth at bedtime. For mood control (Patient not taking: Reported on 09/08/2017) 60 tablet 0  . QUEtiapine (SEROQUEL) 300 MG tablet Take 1 tablet (300 mg total) by mouth at bedtime. For mood  control 30 tablet 0    Objective: BP 108/86   Pulse 74   Temp 98.6 F (37 C) (Rectal)   Resp (!) 21   SpO2 100%  Exam: General: Laying in bed, in NAD Eyes: No scleral icterus, PERRLA, EOMI ENTM: Nose normal, poor dentition, oropharynx clear Neck: Supple, full ROM, looking all over the room Cardiovascular: RRR, no murmurs Respiratory: CTAB, normal work of breathing Gastrointestinal: +BS, soft, non-tender, non-distended MSK: No edema or cyanosis, extremities are warm and well-perfused, no CVA tenderness Derm: No rashes or lesions on exposed skin Neuro: Awake, alert, oriented only to person, able to move all extremities, no gross deficits Psych: Does not answer questions appropriately, will respond to questions but doesn't make  sense, appears mildly anxious and fidgety, inappropriate thought content  Labs and Imaging: CBC BMET  Recent Labs  Lab 10/14/17 1120 10/14/17 1136  WBC 9.1  --   HGB 13.7 13.3  HCT 40.2 39.0  PLT 221  --    Recent Labs  Lab 10/14/17 1120 10/14/17 1136  NA 143 144  K 3.5 3.3*  CL 108 109  CO2 21*  --   BUN 13 14  CREATININE 0.64 0.50*  GLUCOSE 102* 91  CALCIUM 9.4  --      UA: moderate Hgb, large LE, positive nitrites, TNTC RBC, TNTC WBC UDS: negative Troponin: 0.00 Acetaminophen level: <59 Salicylate level: <0.9 Valproic acid level: <10 Ethyl alcohol level: <10  CT head and c-spine: negative   Franklin Ayers, Pete Pelt, MD 10/14/2017, 2:52 PM PGY-3, [place] Intern pager: [phone], text pages welcome

## 2017-10-14 NOTE — ED Triage Notes (Signed)
Pt arrives by [facility] from home where mother found pt laying on the back porch, unknown how long patient had been outside. For ems pt would not speak or follow any commands. Upon assessment in ED pt is alert and not oriented to person place or time. Pt states, "he is a project" and "he doesn't have a legal name". When asked other questions pt does not respond appropriately.

## 2017-10-15 DIAGNOSIS — F419 Anxiety disorder, unspecified: Secondary | ICD-10-CM

## 2017-10-15 DIAGNOSIS — R319 Hematuria, unspecified: Secondary | ICD-10-CM

## 2017-10-15 DIAGNOSIS — N390 Urinary tract infection, site not specified: Secondary | ICD-10-CM

## 2017-10-15 DIAGNOSIS — R450 Nervousness: Secondary | ICD-10-CM

## 2017-10-15 DIAGNOSIS — R4182 Altered mental status, unspecified: Secondary | ICD-10-CM

## 2017-10-15 DIAGNOSIS — N3289 Other specified disorders of bladder: Secondary | ICD-10-CM

## 2017-10-15 DIAGNOSIS — Z87891 Personal history of nicotine dependence: Secondary | ICD-10-CM

## 2017-10-15 LAB — RPR: RPR Ser Ql: NONREACTIVE

## 2017-10-15 LAB — BASIC METABOLIC PANEL
Anion gap: 11 (ref 5–15)
BUN: 15 mg/dL (ref 6–20)
CHLORIDE: 108 mmol/L (ref 101–111)
CO2: 21 mmol/L — AB (ref 22–32)
CREATININE: 0.83 mg/dL (ref 0.61–1.24)
Calcium: 9.2 mg/dL (ref 8.9–10.3)
GFR calc non Af Amer: >60 mL/min (ref >60)
Glucose, Bld: 96 mg/dL (ref 65–99)
POTASSIUM: 3.3 mmol/L — AB (ref 3.5–5.1)
SODIUM: 140 mmol/L (ref 135–145)

## 2017-10-15 LAB — CBC
HCT: 36.9 % — ABNORMAL LOW (ref 39.0–52.0)
HEMOGLOBIN: 12.3 g/dL — AB (ref 13.0–17.0)
MCH: 30.1 pg (ref 26.0–34.0)
MCHC: 33.3 g/dL (ref 30.0–36.0)
MCV: 90.4 fL (ref 78.0–100.0)
PLATELETS: 193 10*3/uL (ref 150–400)
RBC: 4.08 MIL/uL — AB (ref 4.22–5.81)
RDW: 15.6 % — ABNORMAL HIGH (ref 11.5–15.5)
WBC: 6.1 10*3/uL (ref 4.0–10.5)

## 2017-10-15 LAB — HIV ANTIBODY (ROUTINE TESTING W REFLEX): HIV Screen 4th Generation wRfx: NONREACTIVE

## 2017-10-15 MED ORDER — QUETIAPINE FUMARATE 25 MG PO TABS
100.0000 mg | ORAL_TABLET | Freq: Every day | ORAL | Status: DC
  Administered 2017-10-15 – 2017-11-04 (×19): 100 mg via ORAL
  Filled 2017-10-15 (×21): qty 4

## 2017-10-15 MED ORDER — QUETIAPINE FUMARATE 25 MG PO TABS
200.0000 mg | ORAL_TABLET | Freq: Every day | ORAL | Status: DC
  Administered 2017-10-16 – 2017-10-25 (×9): 200 mg via ORAL
  Filled 2017-10-15 (×11): qty 8

## 2017-10-15 NOTE — Discharge Summary (Addendum)
[facility] Discharge Summary  Patient name: Franklin Ayers Medical record number: [id number] Date of birth: 12-26-1960 Age: 57 y.o. Gender: male Date of Admission: 10/14/2017  Date of Discharge: 11/06/2017 Admitting Physician: Lind Covert, MD  Primary Care Provider: Barrie Lyme, FNP Consultants: Psychiatry  Indication for Hospitalization: Altered mental status   Discharge Diagnoses/Problem List:  Catatonia Acute psychosis secondary to medication nonadherence Schizoaffective disorder UTI with hematuria, resolved Naval skin tissue infection, improved BPH with acute urinary retention, improved Moderate malnutrition  Disposition: Inpatient psychiatry   Discharge Condition: Improved  Discharge Exam:  General: Sitting up in chair, awake and alert  Cardiovascular: RRR, no MRG Respiratory: CTAB, normal work of breathing Abdomen: soft nontender nondistended; well healing periumbilical wound Extremities: No edema, 2+ DP Neuro/psych: Moving extremities spontaneously throughout encounter,   Brief Hospital Course:   Schizoaffective disorder with psychosis and catatonia Franklin Ayers is a 57 y.o. male who presented in altered mental status with psychotic features after missing a dose of his Depakote and Seroquel at home.  Full workup was initiated for altered mental status including head CT, Depakote levels, UDS, UTI, TSH, folate and B12, HIV, RPR, ammonia, ethanol, Tylenol, salicylate levels, CMP, CBC.  Head CT and labs were all normal and During hospital hospitalization, positive psychotic features abated while on medication, however negative features such as decreased social withdrawal and minimal verbal response persisted.  Psychiatry was consulted and initially recommended for inpatient hospitalization.However, acute psychotic symptoms improved to the point that they changed their decision and recommended that inpatient hospitalization was no  longer needed. Physical therapy and OT recommended SNF for moderate weakness due to chronic illness. Patient also presented with catatonia with decreased verbalization, facial grimacing. Catatonia well controlled on ativan 0.5 mg every 8 hours p.o.   UTI with hematuria UA which showed large leukocytes and nitrates.  Urine culture showed.  Patient was started on Bactrim for UTI due to penicillin allergy and wanting to avoid fluoroquinolones which can potentiate QTC prolongation as patient is already on Seroquel can do the same. Resolved urine culture showed greater than 100K STAPHYLOCOCCUS LUGDUNENSIS, susceptible to Bactrim.  Completed 7-day course of Bactrim.  Repeat UA was negative for infection or hematuria.  BPH with acute urinary retention Patient initially required catheterization due to an frequent urination in the setting of fluctuating mental status. Patient was started on trial of tamsulosin.  Past spontaneous void trial.  Naval skin tissue infection, improved Patient was found to have purulent drainage from navel while on Bactrim for treatment for UTI.  Given patient's fluctuating mental status, unable to obtain reliable history or physical exam.  CT abdomen was obtained to rule out any wound tracking into the abdomen.  CT abdomen was negative for abscess.  Wound culture was obtained for possible antibiotic resistance.  Wound culture showed growth of 100K STAPHYLOCOCCUS LUGDUNENSIS.  ID was consulted and doxycycline for 5-day course was completed.  Purulent drainage from wound wound resolved.  He has continued to have wound care with topical Bacitracin.   Moderate malnutrition Patient was evaluated by general nutrition as patient was not initially eating with his maximum waning mental status.  But he was started on nutritional supplementation and found to have moderate nutrition due to his chronic illness.  He is also been placed on a soft diet as he has poor dentition and has difficulty  eating solid foods.  He is significantly improved with.  Recommend continuing nutrition supplementation as prescribed.  Issues for Follow  Up:  Spoke with patient's mother during patient's hospitalization.  She feels that she is unable to cope with taking care of him upon discharge.  She requested placement in an assisted living after his hospitalization.  Physical therapy also recommended SNF placement.  Please continue to work on patient's social placement status post his inpatient hospitalization.   Significant Procedures: None  Significant Labs and Imaging:  Recent Labs  Lab 11/01/17 0410 11/04/17 0834  WBC 5.2 6.5  HGB 10.1* 10.2*  HCT 30.7* 31.4*  PLT 202 237   Recent Labs  Lab 11/01/17 0410 11/04/17 0415 11/04/17 0945  NA 137  --  137  K 3.9  --  4.2  CL 97*  --  98*  CO2 28  --  26  GLUCOSE 82  --  112*  BUN 8  --  8  CREATININE 0.62 0.53* 0.50*  CALCIUM 8.9  --  8.9    Ct Head Wo Contrast  Result Date: 10/14/2017 CLINICAL DATA:  Polytrauma, Pt was found laying on his mother's porch, not known how long he was outside. Would not respond to EMS, In ED he is alert but not answering questions appropriately EXAM: CT HEAD WITHOUT CONTRAST CT CERVICAL SPINE WITHOUT CONTRAST TECHNIQUE: Multidetector CT imaging of the head and cervical spine was performed following the standard protocol without intravenous contrast. Multiplanar CT image reconstructions of the cervical spine were also generated. COMPARISON:  03/16/2016 FINDINGS: CT HEAD FINDINGS Brain: No evidence of acute infarction, hemorrhage, hydrocephalus, extra-axial collection or mass lesion/mass effect. Vascular: No hyperdense vessel or unexpected calcification. Skull: No osseous abnormality. Sinuses/Orbits: Visualized paranasal sinuses are clear. Visualized mastoid sinuses are clear. Visualized orbits demonstrate no focal abnormality. Other: Mild left parietal scalp soft tissue swelling likely reflecting a contusion. CT  CERVICAL SPINE FINDINGS Alignment: Normal. Skull base and vertebrae: No acute fracture. No primary bone lesion or focal pathologic process. Soft tissues and spinal canal: No prevertebral fluid or swelling. No visible canal hematoma. Disc levels: Mild degenerative disc disease with disc height loss at C5-6. Broad-based disc osteophyte complex at C5-6 with bilateral uncovertebral degenerative changes. No significant foraminal stenosis. Anterior bridging osteophyte at C4-5. Upper chest: Lung apices are clear. Other: Small sebaceous cyst in the upper posterior neck soft tissues. No other fluid collection or hematoma. IMPRESSION: 1. No acute intracranial pathology. 2. No acute osseous injury of the cervical spine. 3. Mild cervical spine spondylosis as described above. Electronically Signed   By: Kathreen Devoid   On: 10/14/2017 11:34   Ct Cervical Spine Wo Contrast  Result Date: 10/14/2017 CLINICAL DATA:  Polytrauma, Pt was found laying on his mother's porch, not known how long he was outside. Would not respond to EMS, In ED he is alert but not answering questions appropriately EXAM: CT HEAD WITHOUT CONTRAST CT CERVICAL SPINE WITHOUT CONTRAST TECHNIQUE: Multidetector CT imaging of the head and cervical spine was performed following the standard protocol without intravenous contrast. Multiplanar CT image reconstructions of the cervical spine were also generated. COMPARISON:  03/16/2016 FINDINGS: CT HEAD FINDINGS Brain: No evidence of acute infarction, hemorrhage, hydrocephalus, extra-axial collection or mass lesion/mass effect. Vascular: No hyperdense vessel or unexpected calcification. Skull: No osseous abnormality. Sinuses/Orbits: Visualized paranasal sinuses are clear. Visualized mastoid sinuses are clear. Visualized orbits demonstrate no focal abnormality. Other: Mild left parietal scalp soft tissue swelling likely reflecting a contusion. CT CERVICAL SPINE FINDINGS Alignment: Normal. Skull base and vertebrae: No acute  fracture. No primary bone lesion or focal pathologic process. Soft  tissues and spinal canal: No prevertebral fluid or swelling. No visible canal hematoma. Disc levels: Mild degenerative disc disease with disc height loss at C5-6. Broad-based disc osteophyte complex at C5-6 with bilateral uncovertebral degenerative changes. No significant foraminal stenosis. Anterior bridging osteophyte at C4-5. Upper chest: Lung apices are clear. Other: Small sebaceous cyst in the upper posterior neck soft tissues. No other fluid collection or hematoma. IMPRESSION: 1. No acute intracranial pathology. 2. No acute osseous injury of the cervical spine. 3. Mild cervical spine spondylosis as described above. Electronically Signed   By: Kathreen Devoid   On: 10/14/2017 11:34    Results/Tests Pending at Time of Discharge: None  Discharge Medications:  Allergies as of 11/06/2017      Reactions   Penicillins Shortness Of Breath   .Has patient had a PCN reaction causing immediate rash, facial/tongue/throat swelling, SOB or lightheadedness with hypotension: no Has patient had a PCN reaction causing severe rash involving mucus membranes or skin necrosis: yes Has patient had a PCN reaction that required hospitalization: no Has patient had a PCN reaction occurring within the last 10 years: no If all of the above answers are "NO", then may proceed with Cephalosporin use.      Medication List    STOP taking these medications   QUEtiapine 100 MG tablet Commonly known as:  SEROQUEL   QUEtiapine 300 MG tablet Commonly known as:  SEROQUEL Replaced by:  QUEtiapine 300 MG 24 hr tablet     TAKE these medications   divalproex 500 MG 24 hr tablet Commonly known as:  DEPAKOTE ER Take 1 tablet (500 mg total) by mouth daily with breakfast. What changed:    how much to take  when to take this  additional instructions   divalproex 500 MG 24 hr tablet Commonly known as:  DEPAKOTE ER Take 2 tablets (1,000 mg total) by mouth  daily with supper. What changed:  You were already taking a medication with the same name, and this prescription was added. Make sure you understand how and when to take each.   feeding supplement (ENSURE ENLIVE) Liqd Take 237 mLs by mouth 2 (two) times daily between meals.   LORazepam 0.5 MG tablet Commonly known as:  ATIVAN Take 1 tablet (0.5 mg total) by mouth every 8 (eight) hours.   multivitamin with minerals Tabs tablet Take 1 tablet by mouth daily.   mupirocin cream 2 % Commonly known as:  BACTROBAN Apply topically daily. Apply until resolution of peri-naval wound   QUEtiapine 300 MG 24 hr tablet Commonly known as:  SEROQUEL XR Take 1 tablet (300 mg total) by mouth daily with lunch. Replaces:  QUEtiapine 300 MG tablet   tamsulosin 0.4 MG Caps capsule Commonly known as:  FLOMAX Take 2 capsules (0.8 mg total) by mouth daily.            Durable Medical Equipment  (From admission, onward)        Start     Ordered   11/02/17 1230  For home use only DME Walker rolling  Once    Question:  Patient needs a walker to treat with the following condition  Answer:  Physical deconditioning   11/02/17 1229      Discharge Instructions: Please refer to Patient Instructions section of EMR for full details.  Patient was counseled important signs and symptoms that should prompt return to medical care, changes in medications, dietary instructions, activity restrictions, and follow up appointments.   Follow-Up Appointments:  Contact information  for follow-up providers    Barrie Lyme, FNP.   Specialty:  Nurse Practitioner Contact information: [place] [address] [phone]            Contact information for after-discharge care    [facility] .   Service:  Skilled Nursing Contact information: [address] [place] [phone]                   Bonnita Hollow, MD 11/06/2017, 12:06 PM PGY-1, [place]

## 2017-10-15 NOTE — Progress Notes (Signed)
Patient fluctuates throughout the day on weather he can resound. He was able to spoon feed himself for lunch but was unable to for dinner.    Patients mother called for update, she did say that the generalized rash is new and she has not seen it before,  She would like a provider to update her tomorrow (10/16/17) on plan of care.

## 2017-10-15 NOTE — Progress Notes (Signed)
Family Medicine Teaching Service Daily Progress Note Intern Pager: [phone]  Patient name: Franklin Ayers Medical record number: [id number] Date of birth: 1961/09/01 Age: 57 y.o. Gender: male  Primary Care Provider: Barrie Lyme, FNP Consultants: Psychiatry Code Status: Full  Pt Overview and Major Events to Date:  Franklin Ayers is a 57 y.o. male presenting with altered mental status and a UTI. PMH is significant for schizoaffective disorder.  Assessment and Plan: Altered Mental Status: Likely acute psychosis secondary to missing a dose of psychiatric medications from home.  Patient is nonverbal this morning.  Although did not "no", when I asked if he would like me to tell anyone about his current situation.  Causes of altered mental status have been explored with troponin negative, normal TSH, normal glucose, negative UDS.  HIV, RPR, vitamin 12, folate, ammonia are still pending - Psychiatry consult, appreciate recommendations - Continue home medications- Depakote 500mg  in the AM and 1000mg  in the PM and Seroquel 300mg  qhs - Will monitor mental status closely  UTI: UA with moderate Hgb, large LE, positive nitrites. No signs of sepsis and vitals are all within normal limits. No CVA tenderness to suggest pyelonephritis. No abdominal pain to suggest nephrolithiasis. Patient and mother unable to answer if he is urinating like normal. - Patient with Penicillin allergy (shortness of breath) and risk of QT prolongation with concomitant use of fluoroquinolones and high dose Seroquel, so will continue Bactrim 800-160mg  bid x 7 days - Urine culture pending - Strict I/O - If he has urinary retention, would need prostate exam and likely renal US, otherwise do not think he needs further work-up at this time.  Schizoaffective Disorder: Uncontrolled. Follows with Dr. Vernon Prey (psychiatrist) at [facility]. Also sees a therapist at [place]. Does not seem to take his  medications as prescribed. - Continue home medications- Depakote 500mg  in the AM and 1000mg  in the PM and Seroquel 300mg  qhs - Psychiatry consult as above - If patient is still here on Monday, would consider touching base with patient's psychiatrist  FEN/GI: regular diet Prophylaxis: Lovenox  Subjective:  Nonverbal.  Did have one mild had not as "no", when responding to did not want to tell anyone.  Otherwise no other response.  Objective: Temp:  [97.6 F (36.4 C)-98.6 F (37 C)] 97.6 F (36.4 C) (02/03 0519) Pulse Rate:  [48-106] 99 (02/03 0519) Resp:  [14-22] 20 (02/03 0519) BP: (104-125)/(66-86) 105/73 (02/03 0519) SpO2:  [78 %-100 %] 96 % (02/03 0519) Physical Exam: General: No acute distress, lying in bed Cardiovascular: RRR, no MRG respiratory: CTAB, normal work of breathing Abdomen: Soft nontender nondistended Extremities: No edema, 2+ DP  Laboratory: Recent Labs  Lab 10/14/17 1120 10/14/17 1136 10/14/17 2142 10/15/17 0741  WBC 9.1  --  7.2 6.1  HGB 13.7 13.3 13.3 12.3*  HCT 40.2 39.0 39.5 36.9*  PLT 221  --  187 193   Recent Labs  Lab 10/14/17 1120 10/14/17 1136 10/14/17 2142 10/15/17 0741  NA 143 144  --  140  K 3.5 3.3*  --  3.3*  CL 108 109  --  108  CO2 21*  --   --  21*  BUN 13 14  --  15  CREATININE 0.64 0.50* 0.79 0.83  CALCIUM 9.4  --   --  9.2  PROT 7.1  --   --   --   BILITOT 1.0  --   --   --   ALKPHOS 71  --   --   --  ALT 18  --   --   --   AST 20  --   --   --   GLUCOSE 102* 91  --  96   Imaging/Diagnostic Tests: Ct Head Wo Contrast  Result Date: 10/14/2017 CLINICAL DATA:  Polytrauma, Pt was found laying on his mother's porch, not known how long he was outside. Would not respond to EMS, In ED he is alert but not answering questions appropriately EXAM: CT HEAD WITHOUT CONTRAST CT CERVICAL SPINE WITHOUT CONTRAST TECHNIQUE: Multidetector CT imaging of the head and cervical spine was performed following the standard protocol without  intravenous contrast. Multiplanar CT image reconstructions of the cervical spine were also generated. COMPARISON:  03/16/2016 FINDINGS: CT HEAD FINDINGS Brain: No evidence of acute infarction, hemorrhage, hydrocephalus, extra-axial collection or mass lesion/mass effect. Vascular: No hyperdense vessel or unexpected calcification. Skull: No osseous abnormality. Sinuses/Orbits: Visualized paranasal sinuses are clear. Visualized mastoid sinuses are clear. Visualized orbits demonstrate no focal abnormality. Other: Mild left parietal scalp soft tissue swelling likely reflecting a contusion. CT CERVICAL SPINE FINDINGS Alignment: Normal. Skull base and vertebrae: No acute fracture. No primary bone lesion or focal pathologic process. Soft tissues and spinal canal: No prevertebral fluid or swelling. No visible canal hematoma. Disc levels: Mild degenerative disc disease with disc height loss at C5-6. Broad-based disc osteophyte complex at C5-6 with bilateral uncovertebral degenerative changes. No significant foraminal stenosis. Anterior bridging osteophyte at C4-5. Upper chest: Lung apices are clear. Other: Small sebaceous cyst in the upper posterior neck soft tissues. No other fluid collection or hematoma. IMPRESSION: 1. No acute intracranial pathology. 2. No acute osseous injury of the cervical spine. 3. Mild cervical spine spondylosis as described above. Electronically Signed   By: Kathreen Devoid   On: 10/14/2017 11:34   Ct Cervical Spine Wo Contrast  Result Date: 10/14/2017 CLINICAL DATA:  Polytrauma, Pt was found laying on his mother's porch, not known how long he was outside. Would not respond to EMS, In ED he is alert but not answering questions appropriately EXAM: CT HEAD WITHOUT CONTRAST CT CERVICAL SPINE WITHOUT CONTRAST TECHNIQUE: Multidetector CT imaging of the head and cervical spine was performed following the standard protocol without intravenous contrast. Multiplanar CT image reconstructions of the cervical  spine were also generated. COMPARISON:  03/16/2016 FINDINGS: CT HEAD FINDINGS Brain: No evidence of acute infarction, hemorrhage, hydrocephalus, extra-axial collection or mass lesion/mass effect. Vascular: No hyperdense vessel or unexpected calcification. Skull: No osseous abnormality. Sinuses/Orbits: Visualized paranasal sinuses are clear. Visualized mastoid sinuses are clear. Visualized orbits demonstrate no focal abnormality. Other: Mild left parietal scalp soft tissue swelling likely reflecting a contusion. CT CERVICAL SPINE FINDINGS Alignment: Normal. Skull base and vertebrae: No acute fracture. No primary bone lesion or focal pathologic process. Soft tissues and spinal canal: No prevertebral fluid or swelling. No visible canal hematoma. Disc levels: Mild degenerative disc disease with disc height loss at C5-6. Broad-based disc osteophyte complex at C5-6 with bilateral uncovertebral degenerative changes. No significant foraminal stenosis. Anterior bridging osteophyte at C4-5. Upper chest: Lung apices are clear. Other: Small sebaceous cyst in the upper posterior neck soft tissues. No other fluid collection or hematoma. IMPRESSION: 1. No acute intracranial pathology. 2. No acute osseous injury of the cervical spine. 3. Mild cervical spine spondylosis as described above. Electronically Signed   By: Kathreen Devoid   On: 10/14/2017 11:34    Bonnita Hollow, MD 10/15/2017, 10:02 AM PGY-1, [place] Intern pager: [phone], text pages welcome

## 2017-10-15 NOTE — Consult Note (Signed)
[facility] Consult   Reason for Consult:  ''schizoaffective disorder'' Referring Physician:  Dr. Erin Hearing Patient Identification: Franklin Ayers MRN:  [id number] Principal Diagnosis: Schizoaffective disorder [facility]) Diagnosis:   Patient Active Problem List   Diagnosis Date Noted  . Schizoaffective disorder ([place]) [F25.9] 12/23/2016    Priority: Low  . Urinary tract infection with hematuria [N39.0, R31.9]   . Altered mental status [R41.82] 10/14/2017    Total Time spent with patient: 45 minutes  Subjective:   Franklin Ayers is a 57 y.o. male patient admitted with due to altered mental status, psychosis.  HPI:  Patient with long history of Schizoaffective disorder-Depressed type and multiple inpatient psychiatric admission. Patient reportedly brought to the hospital due to altered mental status,worsening symptoms of psychosis including not eating, not drinking, not talking, not taking medications, and not getting out of bed. Patient is a poor historian who reports that he lives with his 57 year old mother. Per chart reviewed, patient has history of living in a group home for over 10 years but recently moving back in with his aged mother who is not able to care for him. Patient appears depressed, withdrawn, has a disorganized thoughts process, tangential speech and admits to hearing voices but unable to elaborate. He is unable to provide answers to most questions.   Past Psychiatric History: as above  Risk to Self: Suicidal Ideation: (Pt could not answer, but mom is concerned pt has "given up") Suicidal Intent: No Is patient at risk for suicide?: Yes Suicidal Plan?: No Access to Means: No What has been your use of drugs/alcohol within the last 12 months?: (pt denies) How many times?: 0 Intentional Self Injurious Behavior: None Risk to Others: Homicidal Ideation: No Thoughts of Harm to Others: No Comment - Thoughts of Harm to Others: none Current Homicidal Intent:  No Current Homicidal Plan: No Access to Homicidal Means: No History of harm to others?: No Assessment of Violence: None Noted Does patient have access to weapons?: No Criminal Charges Pending?: No Does patient have a court date: No Prior Inpatient Therapy: Prior Inpatient Therapy: Yes Prior Therapy Dates: 2018 and previous Prior Therapy Facilty/Provider(s): [facility], [facility]) Reason for Treatment: Schizoaffective (Bipolar, per mom) Prior Outpatient Therapy: Prior Outpatient Therapy: Yes Prior Therapy Dates: Earlier 2018 Prior Therapy Facilty/Provider(s): [facility]  Reason for Treatment: Mental Health Does patient have an ACCT team?: No Does patient have Intensive In-House Services?  : No Does patient have [facility] services? : No Does patient have P4CC services?: No  Past Medical History:  Past Medical History:  Diagnosis Date  . Manic depression ([place])   . Mental disorder   . Schizoaffective disorder [facility])     Past Surgical History:  Procedure Laterality Date  . TONSILLECTOMY     Family History:  Family History  Problem Relation Age of Onset  . Cancer Other   . Stroke Other    Family Psychiatric  History:  Social History:  Social History   Substance and Sexual Activity  Alcohol Use No  . Frequency: Never   Comment: occasional     Social History   Substance and Sexual Activity  Drug Use No    Social History   Socioeconomic History  . Marital status: Single    Spouse name: None  . Number of children: None  . Years of education: None  . Highest education level: None  Social Needs  . Financial resource strain: None  . Food insecurity - worry: None  .  Food insecurity - inability: None  . Transportation needs - medical: None  . Transportation needs - non-medical: None  Occupational History  . None  Tobacco Use  . Smoking status: Former Smoker    Packs/day: 2.00    Types: Cigarettes  . Smokeless tobacco: Never Used  . Tobacco  comment: refused  Substance and Sexual Activity  . Alcohol use: No    Frequency: Never    Comment: occasional  . Drug use: No  . Sexual activity: No  Other Topics Concern  . None  Social History Narrative  . None   Additional Social History:    Allergies:   Allergies  Allergen Reactions  . Penicillins Shortness Of Breath    .Has patient had a PCN reaction causing immediate rash, facial/tongue/throat swelling, SOB or lightheadedness with hypotension: no Has patient had a PCN reaction causing severe rash involving mucus membranes or skin necrosis: yes Has patient had a PCN reaction that required hospitalization: no Has patient had a PCN reaction occurring within the last 10 years: no If all of the above answers are "NO", then may proceed with Cephalosporin use.     Labs:  Results for orders placed or performed during the hospital encounter of 10/14/17 (from the past 48 hour(s))  CBG monitoring, ED     Status: Abnormal   Collection Time: 10/14/17 10:34 AM  Result Value Ref Range   Glucose-Capillary 108 (H) 65 - 99 mg/dL  Ammonia     Status: None   Collection Time: 10/14/17 11:20 AM  Result Value Ref Range   Ammonia 17 9 - 35 umol/L    Comment: Performed at [facility] [address] [place], [place] [address]  Ethanol     Status: None   Collection Time: 10/14/17 11:20 AM  Result Value Ref Range   Alcohol, Ethyl (B) <10 <10 mg/dL    Comment:        LOWEST DETECTABLE LIMIT FOR SERUM ALCOHOL IS 10 mg/dL FOR MEDICAL PURPOSES ONLY Performed at [facility], [place] [address] [place], [place] [address]   Rapid urine drug screen (hospital performed)     Status: None   Collection Time: 10/14/17 11:20 AM  Result Value Ref Range   Opiates NONE DETECTED NONE DETECTED   Cocaine NONE DETECTED NONE DETECTED   Benzodiazepines NONE DETECTED NONE DETECTED   Amphetamines NONE DETECTED NONE DETECTED   Tetrahydrocannabinol NONE DETECTED NONE DETECTED   Barbiturates NONE  DETECTED NONE DETECTED    Comment: (NOTE) DRUG SCREEN FOR MEDICAL PURPOSES ONLY.  IF CONFIRMATION IS NEEDED FOR ANY PURPOSE, NOTIFY LAB WITHIN 5 DAYS. LOWEST DETECTABLE LIMITS FOR URINE DRUG SCREEN Drug Class                     Cutoff (ng/mL) Amphetamine and metabolites    1000 Barbiturate and metabolites    200 Benzodiazepine                 081 Tricyclics and metabolites     300 Opiates and metabolites        300 Cocaine and metabolites        300 THC                            50 Performed at [facility], [place] [address], [place],  [address]   Basic metabolic panel     Status: Abnormal   Collection  Time: 10/14/17 11:20 AM  Result Value Ref Range   Sodium 143 135 - 145 mmol/L   Potassium 3.5 3.5 - 5.1 mmol/L   Chloride 108 101 - 111 mmol/L   CO2 21 (L) 22 - 32 mmol/L   Glucose, Bld 102 (H) 65 - 99 mg/dL   BUN 13 6 - 20 mg/dL   Creatinine, Ser 0.64 0.61 - 1.24 mg/dL   Calcium 9.4 8.9 - 10.3 mg/dL   GFR calc non Af Amer >60 >60 mL/min   GFR calc Af Amer >60 >60 mL/min    Comment: (NOTE) The eGFR has been calculated using the CKD EPI equation. This calculation has not been validated in all clinical situations. eGFR's persistently <60 mL/min signify possible Chronic Kidney Disease.    Anion gap 14 5 - 15    Comment: Performed at [place] [address] [place], [place] [address]  Hepatic function panel     Status: None   Collection Time: 10/14/17 11:20 AM  Result Value Ref Range   Total Protein 7.1 6.5 - 8.1 g/dL   Albumin 3.6 3.5 - 5.0 g/dL   AST 20 15 - 41 U/L   ALT 18 17 - 63 U/L   Alkaline Phosphatase 71 38 - 126 U/L   Total Bilirubin 1.0 0.3 - 1.2 mg/dL   Bilirubin, Direct 0.1 0.1 - 0.5 mg/dL   Indirect Bilirubin 0.9 0.3 - 0.9 mg/dL    Comment: Performed at [facility] [address], [place], [place] [address]  Salicylate level     Status: None   Collection Time: 10/14/17 11:20 AM  Result Value Ref Range   Salicylate Lvl  <8.7 2.8 - 30.0 mg/dL    Comment: Performed at [facility] [address] [place], [place] [address]  Acetaminophen level     Status: Abnormal   Collection Time: 10/14/17 11:20 AM  Result Value Ref Range   Acetaminophen (Tylenol), Serum <10 (L) 10 - 30 ug/mL    Comment:        THERAPEUTIC CONCENTRATIONS VARY SIGNIFICANTLY. A RANGE OF 10-30 ug/mL MAY BE AN EFFECTIVE CONCENTRATION FOR MANY PATIENTS. HOWEVER, SOME ARE BEST TREATED AT CONCENTRATIONS OUTSIDE THIS RANGE. ACETAMINOPHEN CONCENTRATIONS >150 ug/mL AT 4 HOURS AFTER INGESTION AND >50 ug/mL AT 12 HOURS AFTER INGESTION ARE OFTEN ASSOCIATED WITH TOXIC REACTIONS. Performed at [facility], [place] [address], [place], [place] [address]   CBC with Differential     Status: None   Collection Time: 10/14/17 11:20 AM  Result Value Ref Range   WBC 9.1 4.0 - 10.5 K/uL   RBC 4.47 4.22 - 5.81 MIL/uL   Hemoglobin 13.7 13.0 - 17.0 g/dL   HCT 40.2 39.0 - 52.0 %   MCV 89.9 78.0 - 100.0 fL   MCH 30.6 26.0 - 34.0 pg   MCHC 34.1 30.0 - 36.0 g/dL   RDW 15.2 11.5 - 15.5 %   Platelets 221 150 - 400 K/uL   Neutrophils Relative % 72 %   Neutro Abs 6.6 1.7 - 7.7 K/uL   Lymphocytes Relative 20 %   Lymphs Abs 1.8 0.7 - 4.0 K/uL   Monocytes Relative 7 %   Monocytes Absolute 0.6 0.1 - 1.0 K/uL   Eosinophils Relative 0 %   Eosinophils Absolute 0.0 0.0 - 0.7 K/uL   Basophils Relative 1 %   Basophils Absolute 0.1 0.0 - 0.1 K/uL    Comment: Performed at [facility], [address] [place], [place] [address]  Valproic acid level     Status: Abnormal   Collection Time: 10/14/17 11:20 AM  Result Value Ref Range   Valproic Acid Lvl <10 (L) 50.0 - 100.0 ug/mL    Comment: RESULTS CONFIRMED BY MANUAL DILUTION Performed at [facility] [address] [place], [place] [address]   Magnesium     Status: None   Collection Time: 10/14/17 11:20 AM  Result Value Ref Range   Magnesium 2.0 1.7 - 2.4 mg/dL    Comment: Performed at  [facility], [place] [address] [place], [place] [address]  Urinalysis, Routine w reflex microscopic     Status: Abnormal   Collection Time: 10/14/17 11:20 AM  Result Value Ref Range   Color, Urine AMBER (A) YELLOW    Comment: BIOCHEMICALS MAY BE AFFECTED BY COLOR   APPearance CLOUDY (A) CLEAR   Specific Gravity, Urine 1.023 1.005 - 1.030   pH 6.0 5.0 - 8.0   Glucose, UA NEGATIVE NEGATIVE mg/dL   Hgb urine dipstick MODERATE (A) NEGATIVE   Bilirubin Urine NEGATIVE NEGATIVE   Ketones, ur 20 (A) NEGATIVE mg/dL   Protein, ur 100 (A) NEGATIVE mg/dL   Nitrite POSITIVE (A) NEGATIVE   Leukocytes, UA LARGE (A) NEGATIVE   RBC / HPF TOO NUMEROUS TO COUNT 0 - 5 RBC/hpf   WBC, UA TOO NUMEROUS TO COUNT 0 - 5 WBC/hpf   Bacteria, UA RARE (A) NONE SEEN   Squamous Epithelial / LPF NONE SEEN NONE SEEN   WBC Clumps PRESENT    Mucus PRESENT    Hyaline Casts, UA PRESENT     Comment: Performed at [facility], [place] [address], [place], [place] [address]  I-stat troponin, ED     Status: None   Collection Time: 10/14/17 11:35 AM  Result Value Ref Range   Troponin i, poc 0.00 0.00 - 0.08 ng/mL   Comment 3            Comment: Due to the release kinetics of cTnI, a negative result within the first hours of the onset of symptoms does not rule out myocardial infarction with certainty. If myocardial infarction is still suspected, repeat the test at appropriate intervals.   I-Stat Chem 8, ED     Status: Abnormal   Collection Time: 10/14/17 11:36 AM  Result Value Ref Range   Sodium 144 135 - 145 mmol/L   Potassium 3.3 (L) 3.5 - 5.1 mmol/L   Chloride 109 101 - 111 mmol/L   BUN 14 6 - 20 mg/dL   Creatinine, Ser 0.50 (L) 0.61 - 1.24 mg/dL   Glucose, Bld 91 65 - 99 mg/dL   Calcium, Ion 1.18 1.15 - 1.40 mmol/L   TCO2 22 22 - 32 mmol/L   Hemoglobin 13.3 13.0 - 17.0 g/dL   HCT 39.0 39.0 - 52.0 %  Urine culture     Status: Abnormal (Preliminary result)   Collection Time: 10/14/17  1:55 PM  Result  Value Ref Range   Specimen Description URINE, RANDOM    Special Requests NONE    Culture (A)     >=100,000 COLONIES/mL STAPHYLOCOCCUS SPECIES (COAGULASE NEGATIVE) SUSCEPTIBILITIES TO FOLLOW Performed at [facility], [place] [address], [place], [place] [address]    Report Status PENDING   CBC     Status: None   Collection Time: 10/14/17  9:42 PM  Result Value Ref Range   WBC 7.2 4.0 - 10.5 K/uL  RBC 4.42 4.22 - 5.81 MIL/uL   Hemoglobin 13.3 13.0 - 17.0 g/dL   HCT 39.5 39.0 - 52.0 %   MCV 89.4 78.0 - 100.0 fL   MCH 30.1 26.0 - 34.0 pg   MCHC 33.7 30.0 - 36.0 g/dL   RDW 15.2 11.5 - 15.5 %   Platelets 187 150 - 400 K/uL    Comment: Performed at [facility], [place] [address] [place], [place] [address]  Creatinine, serum     Status: None   Collection Time: 10/14/17  9:42 PM  Result Value Ref Range   Creatinine, Ser 0.79 0.61 - 1.24 mg/dL   GFR calc non Af Amer >60 >60 mL/min   GFR calc Af Amer >60 >60 mL/min    Comment: (NOTE) The eGFR has been calculated using the CKD EPI equation. This calculation has not been validated in all clinical situations. eGFR's persistently <60 mL/min signify possible Chronic Kidney Disease. Performed at [facility], [place] [address], [place], [place] [address]   Vitamin B12     Status: None   Collection Time: 10/14/17  9:42 PM  Result Value Ref Range   Vitamin B-12 317 180 - 914 pg/mL    Comment: (NOTE) This assay is not validated for testing neonatal or myeloproliferative syndrome specimens for Vitamin B12 levels. Performed at [facility], [place] [address], [place], [place] [address]   TSH     Status: None   Collection Time: 10/14/17  9:42 PM  Result Value Ref Range   TSH 0.883 0.350 - 4.500 uIU/mL    Comment: Performed by a 3rd Generation assay with a functional sensitivity of <=0.01 uIU/mL. Performed at [facility], [place] [address], [place], [place] [address]   Ammonia     Status: None   Collection Time:  10/14/17  9:42 PM  Result Value Ref Range   Ammonia 30 9 - 35 umol/L    Comment: Performed at [facility], [place] [address], [place], [place] [address]  Basic metabolic panel     Status: Abnormal   Collection Time: 10/15/17  7:41 AM  Result Value Ref Range   Sodium 140 135 - 145 mmol/L   Potassium 3.3 (L) 3.5 - 5.1 mmol/L   Chloride 108 101 - 111 mmol/L   CO2 21 (L) 22 - 32 mmol/L   Glucose, Bld 96 65 - 99 mg/dL   BUN 15 6 - 20 mg/dL   Creatinine, Ser 0.83 0.61 - 1.24 mg/dL   Calcium 9.2 8.9 - 10.3 mg/dL   GFR calc non Af Amer >60 >60 mL/min   GFR calc Af Amer >60 >60 mL/min    Comment: (NOTE) The eGFR has been calculated using the CKD EPI equation. This calculation has not been validated in all clinical situations. eGFR's persistently <60 mL/min signify possible Chronic Kidney Disease.    Anion gap 11 5 - 15    Comment: Performed at [facility] [address] [place], [place] [address]  CBC     Status: Abnormal   Collection Time: 10/15/17  7:41 AM  Result Value Ref Range   WBC 6.1 4.0 - 10.5 K/uL   RBC 4.08 (L) 4.22 - 5.81 MIL/uL   Hemoglobin 12.3 (L) 13.0 - 17.0 g/dL   HCT 36.9 (L) 39.0 - 52.0 %   MCV 90.4 78.0 - 100.0 fL   MCH 30.1 26.0 - 34.0 pg   MCHC 33.3 30.0 - 36.0 g/dL  RDW 15.6 (H) 11.5 - 15.5 %   Platelets 193 150 - 400 K/uL    Comment: Performed at [facility], [place] [address], [place], [place] [address]    Current Facility-Administered Medications  Medication Dose Route Frequency Provider Last Rate Last Dose  . 0.9 %  sodium chloride infusion  250 mL Intravenous PRN Mayo, Pete Pelt, MD      . acetaminophen (TYLENOL) tablet 650 mg  650 mg Oral Q6H PRN Mayo, Pete Pelt, MD       Or  . acetaminophen (TYLENOL) suppository 650 mg  650 mg Rectal Q6H PRN Mayo, Pete Pelt, MD      . divalproex (DEPAKOTE ER) 24 hr tablet 1,000 mg  1,000 mg Oral QHS Lind Covert, MD   1,000 mg at 10/14/17 2112  . divalproex (DEPAKOTE ER) 24 hr tablet 500  mg  500 mg Oral Q24H Chambliss, Marshall L, MD   500 mg at 10/15/17 1049  . enoxaparin (LOVENOX) injection 40 mg  40 mg Subcutaneous Q24H Mayo, Pete Pelt, MD   40 mg at 10/14/17 2112  . ondansetron (ZOFRAN) tablet 4 mg  4 mg Oral Q6H PRN Mayo, Pete Pelt, MD       Or  . ondansetron [facility]) injection 4 mg  4 mg Intravenous Q6H PRN Mayo, Pete Pelt, MD      . polyethylene glycol (MIRALAX / GLYCOLAX) packet 17 g  17 g Oral Daily PRN Mayo, Pete Pelt, MD      . QUEtiapine (SEROQUEL) tablet 100 mg  100 mg Oral Q supper Lind Covert, MD      . QUEtiapine (SEROQUEL) tablet 200 mg  200 mg Oral QHS Chambliss, Marshall L, MD      . sodium chloride flush (NS) 0.9 % injection 3 mL  3 mL Intravenous Q12H Mayo, Pete Pelt, MD   3 mL at 10/14/17 2113  . sodium chloride flush (NS) 0.9 % injection 3 mL  3 mL Intravenous PRN Mayo, Pete Pelt, MD      . sulfamethoxazole-trimethoprim (BACTRIM DS,SEPTRA DS) 800-160 MG per tablet 1 tablet  1 tablet Oral Q12H Pisciotta, Elmyra Ricks, PA-C   1 tablet at 10/15/17 1049    Musculoskeletal: Strength & Muscle Tone: within normal limits Gait & Station: unsteady Patient leans: Front  Psychiatric Specialty Exam: Physical Exam  Psychiatric: Judgment and thought content normal. His affect is blunt. His speech is delayed and tangential. He is slowed, withdrawn and actively hallucinating. Cognition and memory are impaired.    Review of Systems  Constitutional: Negative.   HENT: Negative.   Eyes: Negative.   Respiratory: Negative.   Cardiovascular: Negative.   Musculoskeletal: Negative.   Skin: Negative.   Endo/Heme/Allergies: Negative.   Psychiatric/Behavioral: Positive for depression and hallucinations. The patient is nervous/anxious.     Blood pressure 105/73, pulse 99, temperature 97.6 F (36.4 C), temperature source Oral, resp. rate 20, SpO2 96 %.There is no height or weight on file to calculate BMI.  General Appearance: Casual  Eye Contact:  Minimal  Speech:   Slow  Volume:  Decreased  Mood:  Depressed and Dysphoric  Affect:  Constricted  Thought Process:  Disorganized  Orientation:  Other:  only to person  Thought Content:  Illogical and Hallucinations: Auditory  Suicidal Thoughts:  No  Homicidal Thoughts:  No  Memory:  unable to assess  Judgement:  Poor  Insight:  Shallow  Psychomotor Activity:  Psychomotor Retardation  Concentration:  Concentration: Fair and Attention Span:  Fair  Recall:  Poor  Fund of Knowledge:  Poor  Language:  Fair  Akathisia:  No  Handed:  Right  AIMS (if indicated):     Assets:  Social Support  ADL's:  Impaired  Cognition:  Impaired,  Mild  Sleep:   fair     Treatment Plan Summary: Daily contact with patient to assess and evaluate symptoms and progress in treatment and Medication management Continue Depakote and Seroquel to address schizoaffective disorder Units social worker to assist with inpatient psychiatric placement.  Disposition: Recommend psychiatric Inpatient admission when medically cleared.  Corena Pilgrim, MD 10/15/2017 2:20 PM

## 2017-10-16 ENCOUNTER — Inpatient Hospital Stay (HOSPITAL_COMMUNITY): Payer: Medicare Other

## 2017-10-16 DIAGNOSIS — N3289 Other specified disorders of bladder: Secondary | ICD-10-CM

## 2017-10-16 DIAGNOSIS — E876 Hypokalemia: Secondary | ICD-10-CM

## 2017-10-16 DIAGNOSIS — D649 Anemia, unspecified: Secondary | ICD-10-CM

## 2017-10-16 LAB — CBC
HEMATOCRIT: 35.3 % — AB (ref 39.0–52.0)
Hemoglobin: 11.9 g/dL — ABNORMAL LOW (ref 13.0–17.0)
MCH: 30.7 pg (ref 26.0–34.0)
MCHC: 33.7 g/dL (ref 30.0–36.0)
MCV: 91 fL (ref 78.0–100.0)
Platelets: 174 10*3/uL (ref 150–400)
RBC: 3.88 MIL/uL — AB (ref 4.22–5.81)
RDW: 15.5 % (ref 11.5–15.5)
WBC: 7.4 10*3/uL (ref 4.0–10.5)

## 2017-10-16 LAB — BASIC METABOLIC PANEL
ANION GAP: 11 (ref 5–15)
BUN: 16 mg/dL (ref 6–20)
CHLORIDE: 108 mmol/L (ref 101–111)
CO2: 22 mmol/L (ref 22–32)
Calcium: 9 mg/dL (ref 8.9–10.3)
Creatinine, Ser: 0.83 mg/dL (ref 0.61–1.24)
Glucose, Bld: 80 mg/dL (ref 65–99)
POTASSIUM: 4 mmol/L (ref 3.5–5.1)
SODIUM: 141 mmol/L (ref 135–145)

## 2017-10-16 LAB — URINE CULTURE: Culture: >=100000 — AB

## 2017-10-16 LAB — FOLATE RBC
Folate, Hemolysate: 281.9 ng/mL
Folate, RBC: 748 ng/mL (ref >498)
Hematocrit: 37.7 % (ref 37.5–51.0)

## 2017-10-16 LAB — GC/CHLAMYDIA PROBE AMP (~~LOC~~) NOT AT ARMC
Chlamydia: NEGATIVE
Neisseria Gonorrhea: NEGATIVE

## 2017-10-16 MED ORDER — POTASSIUM CHLORIDE CRYS ER 20 MEQ PO TBCR
40.0000 meq | EXTENDED_RELEASE_TABLET | Freq: Two times a day (BID) | ORAL | Status: DC
  Administered 2017-10-16 – 2017-10-18 (×5): 40 meq via ORAL
  Filled 2017-10-16 (×5): qty 2

## 2017-10-16 MED ORDER — SODIUM CHLORIDE 0.9 % IV BOLUS (SEPSIS)
1000.0000 mL | Freq: Once | INTRAVENOUS | Status: AC
  Administered 2017-10-16: 1000 mL via INTRAVENOUS

## 2017-10-16 NOTE — Progress Notes (Signed)
Pt. Refused evening Seroquel. MD notified.

## 2017-10-16 NOTE — Progress Notes (Signed)
Pt refused all his PO meds, even after multiple attempts. MD resident on call notified. Will continue to monitor.

## 2017-10-16 NOTE — Progress Notes (Signed)
The patient's mother Ms. Asberry Lascola to update her about her son Franklin Ayers.  Informed her that he is going to be eventually transferred to inpatient psychiatry for management of schizoaffective disorder.  Informed her that he is being appropriately treated for UTI.  Patient's mother told me that she is concerned for her son, and asked where he might be going after he is medically discharge.  She says that she is 28, frail, and is unable to fully care for her son and asked that he be transferred to some type of assisted living.  I informed her that he is still an the midst of his current acute psychosis and that placement has not been decided because of his current condition.

## 2017-10-16 NOTE — Progress Notes (Signed)
Family Medicine Teaching Service Daily Progress Note Intern Pager: [phone]  Patient name: Franklin Ayers Medical record number: [id number] Date of birth: 1961-01-18 Age: 57 y.o. Gender: male  Primary Care Provider: Barrie Lyme, FNP Consultants: Psychiatry Code Status: Full  Pt Overview and Major Events to Date:  Franklin Ayers is a 57 y.o. male presenting with altered mental status and a UTI. PMH is significant for schizoaffective disorder.  Assessment and Plan: Acute psychosis secondary to missing a dose of psychiatric medications from home.  Patient refused to take his evening meds including Seroquel and Bactrim.  He is also had minimal p.o. he has demonstrated.  Exhibiting more positive psychotic this morning, he is awake and able to answer some questions as and that is not hungry and that he is in some groin pain, but not oriented to self or place and makes several other nonsensical statements that are out of context.  Appears dry on exam and with minimal p.o. will give fluid saline bolus. - Psychiatry conditions-inpatient management when medically stable - Continue home medications - Depakote 500mg  in the AM and 1000mg  in the PM and Seroquel 300mg  qhs - Will monitor mental status closely  UTI with hematuria: Urine culture showed greater than 100K STAPHYLOCOCCUS LUGDUNENSIS, susceptible to Bactrim. Currently treated with Bactrim, patient.  Patient has no ins and outs, he is making urine at the bedside.  UA consistent with large hematuria and too numerous to count white blood cells.  Urine did appear blood-tinged this a.m.  Hemoglobin is down from 13.3-12.3.  Likely due to losses in urine due to UTI.  Endorse some groin pain this a.m.  Did not endorse any tenderness in CVA region or over penis or genitals.  Unsure if discomfort was related to condom catheter placed or due to UTI.  Denied any pain with urination or feeling urgency to urinate.  Rectal exam did not reveal any enlarged  prostate or prostate tenderness.  Although tenderness would be difficult to assess given current psychosis.  Material likely to to UTI, although BPH is possible but less likely given rectal exam.  Patient did have a postvoid residual of greater than 200 after 1 L bolus.  Did order in and out cath to further assess and obtaining a renal ultrasound to rule out any GU structural abnormalities or hydronephrosis. - Patient with Penicillin allergy (shortness of breath) and risk of QT prolongation with concomitant use of fluoroquinolones and high dose Seroquel, so will continue Bactrim 800-160mg  bid x 7 days - Strict I/O -Recheck CBC -Renal ultrasound -Recommend rechecking UA after completing treatment of UTI, to assess for clearance and persistent hematuria.  Recommend further workup if hematuria still present.  Hypokalemia Low normal potassium 3.3.  Will replete with K-Dur.  And recheck BMP.  Schizoaffective Disorder: Uncontrolled. Follows with Dr. Vernon Prey (psychiatrist) at [facility]. Also sees a therapist at [place]. Does not seem to take his medications as prescribed. - Continue home medications- Depakote 500mg  in the AM and 1000mg  in the PM and Seroquel 300mg  qhs - Psychiatry consult as above  Social: Spoke with patient's mother.  She feels that she is unable to cope with taking care of him upon discharge.  She requested placement in an assisted living.  FEN/GI:  Soft diet due to poor condition Prophylaxis: Lovenox  Disposition: Medically stable for discharge to inpatient psych  Subjective:  Alert and awake in the room.  Says he is having some groin pain.  Says he  is not hungry or thirsty.  Says he does not like talking to people.  Uttering nonsensical statements like "if I had [place] spots I would be rich".  Objective: Temp:  [98 F (36.7 C)-98.7 F (37.1 C)] 98 F (36.7 C) (02/04 0546) Pulse Rate:  [55-94] 63 (02/04 0546) Resp:  [17-18] 18 (02/04 0546) BP:  (114-128)/(68-82) 114/68 (02/04 0546) SpO2:  [95 %-99 %] 99 % (02/04 0546) Physical Exam: General: Alert and conversational, lying in bed Cardiovascular: RRR, no MRG respiratory: CTAB, normal work of breathing Abdomen: Soft nontender nondistended Extremities: No edema, 2+ DP GU: Condom catheter in place, no obvious purulent drainage or erythema or lesions noted, not tender to palpation on exam Psych: Making nonsensical and out of context statements during conversation, calm demeanor DRE: Boggy prostate, without gross enlargement or asymmetry, no rectal masses appreciated, no appreciated prostate tenderness upon palpation Laboratory: Recent Labs  Lab 10/14/17 1120 10/14/17 1136 10/14/17 2142 10/15/17 0741  WBC 9.1  --  7.2 6.1  HGB 13.7 13.3 13.3 12.3*  HCT 40.2 39.0 39.5 36.9*  PLT 221  --  187 193   Recent Labs  Lab 10/14/17 1120 10/14/17 1136 10/14/17 2142 10/15/17 0741  NA 143 144  --  140  K 3.5 3.3*  --  3.3*  CL 108 109  --  108  CO2 21*  --   --  21*  BUN 13 14  --  15  CREATININE 0.64 0.50* 0.79 0.83  CALCIUM 9.4  --   --  9.2  PROT 7.1  --   --   --   BILITOT 1.0  --   --   --   ALKPHOS 71  --   --   --   ALT 18  --   --   --   AST 20  --   --   --   GLUCOSE 102* 91  --  [place]   Bonnita Hollow, MD 10/16/2017, 7:24 AM PGY-1, [place] Intern pager: [phone], text pages welcome

## 2017-10-17 DIAGNOSIS — F251 Schizoaffective disorder, depressive type: Secondary | ICD-10-CM

## 2017-10-17 LAB — BASIC METABOLIC PANEL
Anion gap: 11 (ref 5–15)
BUN: 13 mg/dL (ref 6–20)
CHLORIDE: 108 mmol/L (ref 101–111)
CO2: 23 mmol/L (ref 22–32)
Calcium: 9.2 mg/dL (ref 8.9–10.3)
Creatinine, Ser: 0.66 mg/dL (ref 0.61–1.24)
GFR calc Af Amer: >60 mL/min (ref >60)
GFR calc non Af Amer: >60 mL/min (ref >60)
Glucose, Bld: 84 mg/dL (ref 65–99)
POTASSIUM: 4.2 mmol/L (ref 3.5–5.1)
Sodium: 142 mmol/L (ref 135–145)

## 2017-10-17 MED ORDER — SODIUM CHLORIDE 0.9 % IV BOLUS (SEPSIS)
1000.0000 mL | Freq: Once | INTRAVENOUS | Status: AC
  Administered 2017-10-17: 1000 mL via INTRAVENOUS

## 2017-10-17 MED ORDER — TAMSULOSIN HCL 0.4 MG PO CAPS
0.4000 mg | ORAL_CAPSULE | Freq: Every day | ORAL | Status: DC
  Administered 2017-10-17 – 2017-10-21 (×5): 0.4 mg via ORAL
  Filled 2017-10-17 (×5): qty 1

## 2017-10-17 MED ORDER — SODIUM CHLORIDE 0.9 % IV BOLUS (SEPSIS)
1000.0000 mL | Freq: Once | INTRAVENOUS | Status: AC
Start: 2017-10-17 — End: 2017-10-17
  Administered 2017-10-17: 1000 mL via INTRAVENOUS

## 2017-10-17 NOTE — Progress Notes (Signed)
CSW sent referral to [facility], Mendel Ryder. She reported that patient would likely be more appropriate for a geri psych facility due to him needing strict I&Os and the fact that patient's mother not able to accept him back. CSW to send referral out.  Percell Locus Damien Batty LCSW [phone]

## 2017-10-17 NOTE — Progress Notes (Signed)
Family Medicine Teaching Service Daily Progress Note Intern Pager: [phone]  Patient name: Franklin Ayers Medical record number: [id number] Date of birth: Nov 22, 1960 Age: 57 y.o. Gender: male  Primary Care Provider: Barrie Lyme, FNP Consultants: Psychiatry Code Status: Full  Pt Overview and Major Events to Date:  Franklin Ayers is a 57 y.o. male presenting with altered mental status and a UTI. PMH is significant for schizoaffective disorder.  Assessment and Plan: Acute psychosis secondary to missing a dose of psychiatric medications from home.  Patient refused  evening Seroquel.  He is also had minimal p.o. this a.m.  - Psychiatry conditions-inpatient management when medically stable -Social work consult to facilitate transfer - Continue home medications - Depakote 500mg  in the AM and 1000mg  in the PM and Seroquel 300mg  qhs - Will monitor mental status closely  UTI with hematuria: Urine culture showed greater than 100K STAPHYLOCOCCUS LUGDUNENSIS, susceptible to Bactrim. Currently treated with Bactrim, patient.  He has had normal urine output.  Hemoglobin stable around 12.  Likely due to losses in urine due to UTI.  To exam did not demonstrate any type of enlarged prostate or tenderness upon exam.  Renal ultrasound did not demonstrate any hydronephrosis or obstruction..  - Patient with Penicillin allergy (shortness of breath) and risk of QT prolongation with concomitant use of fluoroquinolones and high dose Seroquel, so will continue  -day 4 of 7 on Bactrim 800-160mg  bid  - Strict I/O -Recheck CBC -Recommend rechecking UA after completing treatment of UTI, to assess for clearance and persistent hematuria.  Recommend further workup if hematuria still present.  Hypokalemia, resolved  Social: Spoke with patient's mother.  She feels that she is unable to cope with taking care of him upon discharge.  She requested placement in an assisted living.  FEN/GI:  Soft diet due to poor  condition Prophylaxis: Lovenox  Disposition: Medically stable for discharge to inpatient psych  Subjective:  Asleep in bed.  On response of to verbal questioning.    Objective: Temp:  [98.3 F (36.8 C)-98.6 F (37 C)] 98.6 F (37 C) (02/05 0435) Pulse Rate:  [82-98] 98 (02/05 0435) Resp:  [18-20] 18 (02/05 0435) BP: (122-129)/(73-77) 122/76 (02/05 0435) SpO2:  [96 %] 96 % (02/05 0435) Physical Exam: General: Sleeping in bed, arousable Cardiovascular: RRR, no MRG Respiratory: CTAB, normal work of breathing Abdomen: No CVA tenderness soft nontender nondistended Extremities: No edema, 2+ DP  Laboratory: Recent Labs  Lab 10/14/17 2142 10/15/17 0741 10/16/17 1021  WBC 7.2 6.1 7.4  HGB 13.3 12.3* 11.9*  HCT 39.5  37.7 36.9* 35.3*  PLT 187 193 174   Recent Labs  Lab 10/14/17 1120  10/15/17 0741 10/16/17 1021 10/17/17 0321  NA 143   < > 140 141 142  K 3.5   < > 3.3* 4.0 4.2  CL 108   < > 108 108 108  CO2 21*  --  21* 22 23  BUN 13   < > 15 16 13   CREATININE 0.64   < > 0.83 0.83 0.66  CALCIUM 9.4  --  9.2 9.0 9.2  PROT 7.1  --   --   --   --   BILITOT 1.0  --   --   --   --   ALKPHOS 71  --   --   --   --   ALT 18  --   --   --   --   AST 20  --   --   --   --  GLUCOSE 102*   < > 96 80 84   < > = values in this interval not displayed.   Bonnita Hollow, MD 10/17/2017, 9:26 AM PGY-1, [place] Intern pager: [phone], text pages welcome

## 2017-10-18 DIAGNOSIS — R338 Other retention of urine: Secondary | ICD-10-CM

## 2017-10-18 DIAGNOSIS — R4587 Impulsiveness: Secondary | ICD-10-CM

## 2017-10-18 DIAGNOSIS — E440 Moderate protein-calorie malnutrition: Secondary | ICD-10-CM

## 2017-10-18 LAB — MAGNESIUM: Magnesium: 1.9 mg/dL (ref 1.7–2.4)

## 2017-10-18 LAB — PHOSPHORUS: Phosphorus: 2.3 mg/dL — ABNORMAL LOW (ref 2.5–4.6)

## 2017-10-18 LAB — BASIC METABOLIC PANEL
Anion gap: 10 (ref 5–15)
BUN: 12 mg/dL (ref 6–20)
CHLORIDE: 105 mmol/L (ref 101–111)
CO2: 23 mmol/L (ref 22–32)
Calcium: 9.2 mg/dL (ref 8.9–10.3)
Creatinine, Ser: 0.76 mg/dL (ref 0.61–1.24)
GFR calc non Af Amer: >60 mL/min (ref >60)
Glucose, Bld: 150 mg/dL — ABNORMAL HIGH (ref 65–99)
POTASSIUM: 4.5 mmol/L (ref 3.5–5.1)
Sodium: 138 mmol/L (ref 135–145)

## 2017-10-18 LAB — CBC
HEMATOCRIT: 35.5 % — AB (ref 39.0–52.0)
HEMOGLOBIN: 11.6 g/dL — AB (ref 13.0–17.0)
MCH: 30.1 pg (ref 26.0–34.0)
MCHC: 32.7 g/dL (ref 30.0–36.0)
MCV: 92 fL (ref 78.0–100.0)
PLATELETS: 145 10*3/uL — AB (ref 150–400)
RBC: 3.86 MIL/uL — AB (ref 4.22–5.81)
RDW: 15.5 % (ref 11.5–15.5)
WBC: 3.5 10*3/uL — AB (ref 4.0–10.5)

## 2017-10-18 MED ORDER — LORAZEPAM 1 MG PO TABS
1.0000 mg | ORAL_TABLET | Freq: Once | ORAL | Status: AC
  Administered 2017-10-18: 1 mg via ORAL
  Filled 2017-10-18: qty 1

## 2017-10-18 MED ORDER — ENSURE ENLIVE PO LIQD
237.0000 mL | Freq: Two times a day (BID) | ORAL | Status: DC
  Administered 2017-10-18 – 2017-11-06 (×34): 237 mL via ORAL

## 2017-10-18 MED ORDER — LORAZEPAM 1 MG PO TABS
1.0000 mg | ORAL_TABLET | Freq: Three times a day (TID) | ORAL | Status: DC
  Administered 2017-10-18 – 2017-10-23 (×12): 1 mg via ORAL
  Filled 2017-10-18 (×8): qty 1
  Filled 2017-10-18: qty 2
  Filled 2017-10-18 (×4): qty 1

## 2017-10-18 MED ORDER — MUPIROCIN CALCIUM 2 % EX CREA
TOPICAL_CREAM | Freq: Every day | CUTANEOUS | Status: DC
  Administered 2017-10-18 – 2017-11-06 (×20): via TOPICAL
  Filled 2017-10-18: qty 15

## 2017-10-18 MED ORDER — K PHOS MONO-SOD PHOS DI & MONO 155-852-130 MG PO TABS
250.0000 mg | ORAL_TABLET | Freq: Once | ORAL | Status: AC
  Administered 2017-10-18: 250 mg via ORAL
  Filled 2017-10-18: qty 1

## 2017-10-18 MED ORDER — PROSIGHT PO TABS
1.0000 | ORAL_TABLET | Freq: Every day | ORAL | Status: DC
  Administered 2017-10-18 – 2017-11-06 (×19): 1 via ORAL
  Filled 2017-10-18 (×19): qty 1

## 2017-10-18 MED ORDER — LORAZEPAM 2 MG/ML IJ SOLN: 1.0000 mg | Freq: Once | INTRAMUSCULAR | Status: AC

## 2017-10-18 NOTE — Progress Notes (Signed)
CSW faxed referral to [facility], [facility], and [place] for review.  Percell Locus Harrietta Incorvaia LCSW [phone]

## 2017-10-18 NOTE — Progress Notes (Signed)
Patient seen this evening. Per nurse tech at bedside, patient had significant improvement. Now talking and interactive, knows his name and what he had for lunch. Patient recognized Dr. Grandville Silos and states he feels better. Will continue ativan 1mg  TID.  Bufford Lope, DO PGY-2, [facility] 10/18/2017 5:30 PM

## 2017-10-18 NOTE — Consult Note (Signed)
[facility] Face-to-Face Psychiatry Consult   Reason for Consult:  Management of depressive symptoms Referring Physician:  Dr. Erin Hearing Patient Identification: Franklin Ayers MRN:  [id number] Principal Diagnosis: Schizoaffective disorder [facility]) Diagnosis:   Patient Active Problem List   Diagnosis Date Noted  . Anemia [D64.9]   . Hypokalemia [E87.6]   . Bladder distension [N32.89]   . Urinary tract infection with hematuria [N39.0, R31.9]   . Altered mental status [R41.82] 10/14/2017  . Schizoaffective disorder ([place]) [F25.9] 12/23/2016    Total Time spent with patient: 1 hour  Subjective:   Franklin Ayers is a 57 y.o. male patient admitted with altered mental status.  HPI:   Per chart review, patient was admitted with altered mental status and was found to have an UTI with hematuria. He is currently receiving Bactrim. He was seen by the psychiatry consult service on 2/3 for psychosis. He had not been eating, drinking, talking or taking his medications. He was also lying in the bed all day. He was disorganized in thought process with tangential speech, withdrawn behavior and depressed mood at time of interview. He lives with his 15 y/o mother. He was previously living in a group home for over 10 years. His mother is no longer able to care for him so he will likely need placement in an ALF in the near future. He is prescribed Depakote 500/1000 mg daily and Seroquel 200 mg qhs.   On interview, patient is nearly mute. He shakes his nod no when asked if he is having hallucinations. He was unable to answer further questions. He was able to follow simple commands after demonstrating them (such as raising his hands and moving his legs). He exhibited grimacing and stereotypic movements throughout interview with frequent eye blinking.   Past Psychiatric History: Schizoaffective disorder  Risk to Self: Suicidal Ideation: (Pt could not answer, but mom is concerned pt has "given up") Suicidal Intent:  No Is patient at risk for suicide?: Yes Suicidal Plan?: No Access to Means: No What has been your use of drugs/alcohol within the last 12 months?: (pt denies) How many times?: 0 Intentional Self Injurious Behavior: None Risk to Others: Homicidal Ideation: No Thoughts of Harm to Others: No Comment - Thoughts of Harm to Others: none Current Homicidal Intent: No Current Homicidal Plan: No Access to Homicidal Means: No History of harm to others?: No Assessment of Violence: None Noted Does patient have access to weapons?: No Criminal Charges Pending?: No Does patient have a court date: No Prior Inpatient Therapy: Prior Inpatient Therapy: Yes Prior Therapy Dates: 2018 and previous Prior Therapy Facilty/Provider(s): [facility], [facility]) Reason for Treatment: Schizoaffective (Bipolar, per mom) Prior Outpatient Therapy: Prior Outpatient Therapy: Yes Prior Therapy Dates: Earlier 2018 Prior Therapy Facilty/Provider(s): [facility]  Reason for Treatment: Mental Health Does patient have an ACCT team?: No Does patient have Intensive In-House Services?  : No Does patient have [facility] services? : No Does patient have P4CC services?: No  Past Medical History:  Past Medical History:  Diagnosis Date  . Manic depression ([place])   . Mental disorder   . Schizoaffective disorder [facility])     Past Surgical History:  Procedure Laterality Date  . TONSILLECTOMY     Family History:  Family History  Problem Relation Age of Onset  . Cancer Other   . Stroke Other    Family Psychiatric  History: Unknown Social History:  Social History   Substance and Sexual Activity  Alcohol Use No  . Frequency: Never  Comment: occasional     Social History   Substance and Sexual Activity  Drug Use No    Social History   Socioeconomic History  . Marital status: Single    Spouse name: None  . Number of children: None  . Years of education: None  . Highest education level: None  Social  Needs  . Financial resource strain: None  . Food insecurity - worry: None  . Food insecurity - inability: None  . Transportation needs - medical: None  . Transportation needs - non-medical: None  Occupational History  . None  Tobacco Use  . Smoking status: Former Smoker    Packs/day: 2.00    Types: Cigarettes  . Smokeless tobacco: Never Used  . Tobacco comment: refused  Substance and Sexual Activity  . Alcohol use: No    Frequency: Never    Comment: occasional  . Drug use: No  . Sexual activity: No  Other Topics Concern  . None  Social History Narrative  . None   Additional Social History: He lives at home with his 23 y/o mother.     Allergies:   Allergies  Allergen Reactions  . Penicillins Shortness Of Breath    .Has patient had a PCN reaction causing immediate rash, facial/tongue/throat swelling, SOB or lightheadedness with hypotension: no Has patient had a PCN reaction causing severe rash involving mucus membranes or skin necrosis: yes Has patient had a PCN reaction that required hospitalization: no Has patient had a PCN reaction occurring within the last 10 years: no If all of the above answers are "NO", then may proceed with Cephalosporin use.     Labs:  Results for orders placed or performed during the hospital encounter of 10/14/17 (from the past 48 hour(s))  Basic metabolic panel     Status: None   Collection Time: 10/17/17  3:21 AM  Result Value Ref Range   Sodium 142 135 - 145 mmol/L   Potassium 4.2 3.5 - 5.1 mmol/L   Chloride 108 101 - 111 mmol/L   CO2 23 22 - 32 mmol/L   Glucose, Bld 84 65 - 99 mg/dL   BUN 13 6 - 20 mg/dL   Creatinine, Ser 0.66 0.61 - 1.24 mg/dL   Calcium 9.2 8.9 - 10.3 mg/dL   GFR calc non Af Amer >60 >60 mL/min   GFR calc Af Amer >60 >60 mL/min    Comment: (NOTE) The eGFR has been calculated using the CKD EPI equation. This calculation has not been validated in all clinical situations. eGFR's persistently <60 mL/min signify  possible Chronic Kidney Disease.    Anion gap 11 5 - 15    Comment: Performed at [facility] [address], [place], [place] [address]  Basic metabolic panel     Status: Abnormal   Collection Time: 10/18/17  9:41 AM  Result Value Ref Range   Sodium 138 135 - 145 mmol/L   Potassium 4.5 3.5 - 5.1 mmol/L   Chloride 105 101 - 111 mmol/L   CO2 23 22 - 32 mmol/L   Glucose, Bld 150 (H) 65 - 99 mg/dL   BUN 12 6 - 20 mg/dL   Creatinine, Ser 0.76 0.61 - 1.24 mg/dL   Calcium 9.2 8.9 - 10.3 mg/dL   GFR calc non Af Amer >60 >60 mL/min   GFR calc Af Amer >60 >60 mL/min    Comment: (NOTE) The eGFR has been calculated using the CKD EPI equation. This calculation has not been validated  in all clinical situations. eGFR's persistently <60 mL/min signify possible Chronic Kidney Disease.    Anion gap 10 5 - 15    Comment: Performed at [facility] [address], [place], [place] [address]  CBC     Status: Abnormal   Collection Time: 10/18/17  9:41 AM  Result Value Ref Range   WBC 3.5 (L) 4.0 - 10.5 K/uL   RBC 3.86 (L) 4.22 - 5.81 MIL/uL   Hemoglobin 11.6 (L) 13.0 - 17.0 g/dL   HCT 35.5 (L) 39.0 - 52.0 %   MCV 92.0 78.0 - 100.0 fL   MCH 30.1 26.0 - 34.0 pg   MCHC 32.7 30.0 - 36.0 g/dL   RDW 15.5 11.5 - 15.5 %   Platelets 145 (L) 150 - 400 K/uL    Comment: Performed at [facility], [place] [address] [place], [place] [address]  Magnesium     Status: None   Collection Time: 10/18/17  9:41 AM  Result Value Ref Range   Magnesium 1.9 1.7 - 2.4 mg/dL    Comment: Performed at [facility] [address], [place], [place] [address]  Phosphorus     Status: Abnormal   Collection Time: 10/18/17  9:41 AM  Result Value Ref Range   Phosphorus 2.3 (L) 2.5 - 4.6 mg/dL    Comment: Performed at [facility] [address] [place], [place] [address]    Current Facility-Administered Medications  Medication Dose Route Frequency Provider Last Rate Last Dose  . 0.9 %   sodium chloride infusion  250 mL Intravenous PRN Mayo, Pete Pelt, MD      . acetaminophen (TYLENOL) tablet 650 mg  650 mg Oral Q6H PRN Mayo, Pete Pelt, MD       Or  . acetaminophen (TYLENOL) suppository 650 mg  650 mg Rectal Q6H PRN Mayo, Pete Pelt, MD      . divalproex (DEPAKOTE ER) 24 hr tablet 1,000 mg  1,000 mg Oral QHS Lind Covert, MD   1,000 mg at 10/17/17 2334  . divalproex (DEPAKOTE ER) 24 hr tablet 500 mg  500 mg Oral Q24H Lind Covert, MD   500 mg at 10/18/17 0954  . enoxaparin (LOVENOX) injection 40 mg  40 mg Subcutaneous Q24H Mayo, Pete Pelt, MD   40 mg at 10/17/17 2335  . multivitamin (PROSIGHT) tablet 1 tablet  1 tablet Oral Daily Mayo, Pete Pelt, MD   1 tablet at 10/18/17 1248  . ondansetron (ZOFRAN) tablet 4 mg  4 mg Oral Q6H PRN Mayo, Pete Pelt, MD       Or  . ondansetron [facility]) injection 4 mg  4 mg Intravenous Q6H PRN Mayo, Pete Pelt, MD      . phosphorus (K PHOS NEUTRAL) tablet 250 mg  250 mg Oral Once Mayo, Pete Pelt, MD      . polyethylene glycol (MIRALAX / GLYCOLAX) packet 17 g  17 g Oral Daily PRN Mayo, Pete Pelt, MD      . QUEtiapine (SEROQUEL) tablet 100 mg  100 mg Oral Q supper Lind Covert, MD   100 mg at 10/17/17 1722  . QUEtiapine (SEROQUEL) tablet 200 mg  200 mg Oral QHS Lind Covert, MD   200 mg at 10/17/17 2334  . sodium chloride flush (NS) 0.9 % injection 3 mL  3 mL Intravenous Q12H Mayo, Pete Pelt, MD   3 mL at 10/18/17 0955  . sodium chloride flush (NS) 0.9 %  injection 3 mL  3 mL Intravenous PRN Mayo, Pete Pelt, MD      . sulfamethoxazole-trimethoprim (BACTRIM DS,SEPTRA DS) 800-160 MG per tablet 1 tablet  1 tablet Oral Q12H Pisciotta, Nicole, PA-C   1 tablet at 10/18/17 0954  . tamsulosin (FLOMAX) capsule 0.4 mg  0.4 mg Oral Daily Orson Eva J, DO   0.4 mg at 10/18/17 4917    Musculoskeletal: Strength & Muscle Tone: UTA due to AMS. Gait & Station: UTA since patient was lying in bed. Patient leans: N/A  Psychiatric  Specialty Exam: Physical Exam  Nursing note and vitals reviewed. Constitutional: He appears well-developed and well-nourished.  HENT:  Head: Normocephalic and atraumatic.  Neck: Normal range of motion.  Respiratory: Effort normal.  Musculoskeletal: Normal range of motion.  Neurological: He is alert.  Psychiatric: Thought content normal. His affect is blunt. His speech is delayed. He is slowed. Cognition and memory are impaired. He expresses impulsivity.    Review of Systems  Unable to perform ROS: Mental status change    Blood pressure 95/60, pulse (!) 59, temperature 98 F (36.7 C), temperature source Oral, resp. rate 18, SpO2 99 %.There is no height or weight on file to calculate BMI.  General Appearance: Fairly Groomed, middle aged, Caucasian male, wearing a hospital gown and lying in bed. NAD.   Eye Contact:  Poor  Speech:  Slow  Volume:  Decreased  Mood:  Did not state  Affect:  Flat  Thought Process: Superficially Linear but minimal speech.  Orientation:  Other:  UTA since patient has minimal speech.  Thought Content:  UTA since patient has minimal speech.   Suicidal Thoughts:  No  Homicidal Thoughts:  No  Memory:  Immediate;   UTA since patient has minimal speech.  Recent;   UTA since patient has minimal speech.  Remote;   UTA since patient has minimal speech.   Judgement:  Impaired  Insight:  Poor  Psychomotor Activity:  He grimaces with sterotypic movements throughout interview.  Concentration:  Concentration: Poor and Attention Span: Poor  Recall:  UTA since patient has minimal speech.   Fund of Knowledge:  UTA since patient has minimal speech.   Language:  Poor  Akathisia:  NA  Handed:  Right  AIMS (if indicated):   N/A  Assets:  Housing Social Support  ADL's:  Impaired  Cognition:  Impaired secondary to altered mental status   Sleep:   N/A    Assessment:  Franklin Ayers is a 57 y.o. male who was admitted with altered mental status and found to have an  UTI with hematuria. Psychiatry was consulted on 2/3 for psychosis. He has reportedly not been eating, drinking, talking or taking his medications at home. He was nearly mute on interview and exhibited stereotypic movements and grimacing. His symptoms are concerning for catatonia secondary to medical condition versus severe psychiatric illness. An Ativan challenge will likely be important to confirm diagnosis. His symptoms could also be secondary to severe psychiatric illness which may require adjusting his antipsychotic medication.   Treatment Plan Summary: -Give Ativan 1 mg today. Reassess for improvement of symptoms. If symptoms improve then increase Ativan to 1 mg TID for catatonia.  -If symptoms do not improve then patient will likely need an adjustment of his antipsychotic medication due to severe psychiatric illness causing current symptoms.  -Continue Depakote 500 mg q am and 1000 mg qhs.  -Continue Seroquel 300 mg daily.  -QTc 433 on 2/3.  -Psychiatry will continue to follow  as needed.   Disposition: Recommend psychiatric Inpatient admission when medically cleared.  Faythe Dingwall, DO 10/18/2017 1:17 PM

## 2017-10-18 NOTE — Progress Notes (Signed)
Family Medicine Teaching Service Daily Progress Note Intern Pager: [phone]  Patient name: Franklin Ayers Medical record number: [id number] Date of birth: 11-05-1960 Age: 57 y.o. Gender: male  Primary Care Provider: Barrie Lyme, FNP Consultants: Psychiatry Code Status: Full  Pt Overview and Major Events to Date:  Franklin Ayers is a 57 y.o. male presenting with altered mental status and a UTI. PMH is significant for schizoaffective disorder.  Assessment and Plan:  Acute psychosis secondary to schizoaffective disorder  secondary to missing a dose of psychiatric medications from home.  Patient refused  evening Seroquel.   - Psychiatry conditions-inpatient management when medically stable -Pending referral to geriatric psych as behavioral health felt that it was not appropriate to receive him due to lack of placement upon discharge of inpatient hospitalization - Depakote 500mg  in the AM and 1000mg  in the PM and Seroquel 300mg  qhs - Will monitor mental status closely  Minimal p.o.  Patient has had minimal p.o. this hospitalization.  With nothing eaten in the past 24 hours.  Today he has been given 2 normal saline IV boluses, we abstain from starting IV fluids as patient was able to eat before coming in and we did not want to suppress his hunger or thirst drives.  Likely not eating or drinking due to acute psychosis.  Will place nutrition consult and trial assist with feeding.  patient recently lost dentures.  And has very poor dentition.  He has a soft diet home where he was fed by his mother.  We will continue that while he is inpatient. -Appreciate psychiatry recommendations -Nutrition consult appreciated -Assist with feeds -Consider another saline bolus this p.m. if patient continues to have low p.o. -Check BMP, CBC, magnesium, phosphorus  ?Acute urinary retention  Patient had 420 mL retained in bladder this a.m. Unsure if it is true tension, because of patient's mental status  and current catheter condom, do not know if this is true post void residual. DDX: Medication induced, psychogenic,UTI, BPH.  Patient has history of intermittently taking Seroquel at home.  Does have some anticholinergic side effects, although not on a very high dose, may be leading to acute urinary retention.  Acute psychosis may be masking the appearance of acute urinary retention due to poor initiation and global confusion.  Patient does have UTI with hematuria.  Perhaps some urethral inflammation is leading to some level of retention.  We have ruled out hydronephrosis which makes stone or pyelonephritis less likely.  Patient could have early onset BPH, although DRE did not reveal grossly enlarged prostate or prostatitis.    -Appreciate psychiatry recommendations -Continue tamsulosin 0.4 mg, consider increasing if still has minimal urine output -Strict I's and O's -Bladder scan every 8, consider I and O if greater than 300 mL  UTI with hematuria: Urine culture showed greater than 100K STAPHYLOCOCCUS LUGDUNENSIS, susceptible to Bactrim. Currently treated with Bactrim, patient.  He has had normal urine output.  Hemoglobin stable around 12.  Likely due to losses in urine due to UTI.  To exam did not demonstrate any type of enlarged prostate or tenderness upon exam.  Renal ultrasound did not demonstrate any hydronephrosis or obstruction..  - Patient with Penicillin allergy (shortness of breath) and risk of QT prolongation with concomitant use of fluoroquinolones and high dose Seroquel, so will continue  -day 5 of 7 on Bactrim 800-160mg  bid  - Strict I/O -Recheck CBC -Recommend rechecking UA after completing treatment of UTI, to assess for clearance and persistent hematuria.  Recommend further  workup if hematuria still present.  Social: Spoke with patient's mother.  She feels that she is unable to cope with taking care of him upon discharge.  She requested placement in an assisted living.  FEN/GI:  Soft  diet due to poor dentition Prophylaxis: Lovenox  Disposition: Medically stable for discharge to inpatient psych  Subjective:  Awake in bed.  Answers yes/no questions.  Does not endorse being hungry, thirsty, pain, needing to urinate.  Objective: Temp:  [98 F (36.7 C)-98.8 F (37.1 C)] 98 F (36.7 C) (02/06 0458) Pulse Rate:  [59-65] 59 (02/06 0458) Resp:  [18] 18 (02/06 0458) BP: (94-99)/(55-60) 95/60 (02/06 0458) SpO2:  [96 %-99 %] 99 % (02/06 0458) Physical Exam: General: Awake in bed.  Does not make eye contact. Cardiovascular: RRR, no MRG Respiratory: CTAB, normal work of breathing Abdomen: No CVA tenderness soft nontender nondistended Extremities: No edema, 2+ DP Neuro/psych: She is not oriented to self place or situation.  Can answer yes/no questions.  Moving feet and slightly fidgeting.  laboratory: Recent Labs  Lab 10/14/17 2142 10/15/17 0741 10/16/17 1021  WBC 7.2 6.1 7.4  HGB 13.3 12.3* 11.9*  HCT 39.5  37.7 36.9* 35.3*  PLT 187 193 174   Recent Labs  Lab 10/14/17 1120  10/15/17 0741 10/16/17 1021 10/17/17 0321  NA 143   < > 140 141 142  K 3.5   < > 3.3* 4.0 4.2  CL 108   < > 108 108 108  CO2 21*  --  21* 22 23  BUN 13   < > 15 16 13   CREATININE 0.64   < > 0.83 0.83 0.66  CALCIUM 9.4  --  9.2 9.0 9.2  PROT 7.1  --   --   --   --   BILITOT 1.0  --   --   --   --   ALKPHOS 71  --   --   --   --   ALT 18  --   --   --   --   AST 20  --   --   --   --   GLUCOSE 102*   < > 96 80 84   < > = values in this interval not displayed.   Bonnita Hollow, MD 10/18/2017, 7:28 AM PGY-1, [place] Intern pager: [phone], text pages welcome

## 2017-10-18 NOTE — Care Management Important Message (Signed)
Important Message  Patient Details  Name: Franklin Ayers MRN: [id number] Date of Birth: 1961/07/27   Medicare Important Message Given:  No   Due to illness patient is not able to sign.  Franklin Ayers 10/18/2017, 11:40 AM

## 2017-10-18 NOTE — Progress Notes (Addendum)
Initial Nutrition Assessment  DOCUMENTATION CODES:   Non-severe (moderate) malnutrition in context of chronic illness  INTERVENTION:  1. Ensure Enlive po BID, each supplement provides 350 kcal and 20 grams of protein 2. Magic cup TID with meals, each supplement provides 290 kcal and 9 grams of protein  NUTRITION DIAGNOSIS:   Moderate Malnutrition related to chronic illness as evidenced by moderate muscle depletion, mild fat depletion, moderate fat depletion, mild muscle depletion, percent weight loss.  GOAL:   Patient will meet greater than or equal to 90% of their needs  MONITOR:   PO intake, I & O's, Labs, Supplement acceptance, Weight trends  REASON FOR ASSESSMENT:   Consult Assessment of nutrition requirement/status  ASSESSMENT:   57 yo male with PMH schizoaffective disorder, presents with AMS and UTI  Acute psychosis, possible catatonic state.  Attempted to call patient's Mother, RD left a message. Patient unable to provide any history. Ate breakfast well per NT. Per RN, he sometimes eats well and sometimes doesn't. It is unclear if patient is meeting his needs.  Weight in chart is 140 pounds from 08/21/2017, on bed scale patient is 127.2 pounds today, a 12.8 pound/9% severe weight loss over 2 months. RD will monitor, patient may need alternative forms of nutrition dependent upon PO intake and projected length of stay. There is concern with the placement of a feeding tube in the setting of acute psychosis, and risk of him pulling it out.  Labs reviewed:  Phos 2.3 Medications reviewed and include:  Prosight MVI  NUTRITION - FOCUSED PHYSICAL EXAM:    Most Recent Value  Orbital Region  No depletion  Upper Arm Region  Mild depletion  Thoracic and Lumbar Region  Mild depletion  Buccal Region  Moderate depletion  Temple Region  Mild depletion  Clavicle Bone Region  Mild depletion  Clavicle and Acromion Bone Region  Mild depletion  Scapular Bone Region  Unable to assess   Dorsal Hand  Mild depletion  Patellar Region  Moderate depletion  Anterior Thigh Region  Moderate depletion  Posterior Calf Region  Moderate depletion  Edema (RD Assessment)  None  Hair  Reviewed  Eyes  Reviewed  Mouth  Reviewed  Skin  Reviewed  Nails  Reviewed       Diet Order:  DIET SOFT Room service appropriate? Yes; Fluid consistency: Thin  EDUCATION NEEDS:   Not appropriate for education at this time  Skin:  Skin Assessment: Reviewed RN Assessment  Last BM:  PTA  Height:   Ht Readings from Last 1 Encounters:  08/21/17 5\' 8"  (1.727 m)    Weight:   08/21/17 - 127.2 pounds (57.81kg) on bed scale  Ideal Body Weight:  70 kg  BMI:  19.38  Estimated Nutritional Needs:   Kcal:  1600-1909 calories  Protein:  89-102 grams  Fluid:  1.6-1.9L  Satira Anis. Kyiesha Millward, MS, RD LDN Inpatient Clinical Dietitian Pager [phone]

## 2017-10-18 NOTE — Progress Notes (Signed)
Spoke with Dr. Mariea Clonts with psychiatry.  She recommends to trial the patient on 1 mg of Ativan.  Because she believes that patient is likely in a catatonic state.  If patient responds, she recommended 1 mg of Ativan 3 times a day.  If not, she will further adjust his antipsychotics.

## 2017-10-19 LAB — CBC
HCT: 34.6 % — ABNORMAL LOW (ref 39.0–52.0)
HEMOGLOBIN: 11.5 g/dL — AB (ref 13.0–17.0)
MCH: 30.3 pg (ref 26.0–34.0)
MCHC: 33.2 g/dL (ref 30.0–36.0)
MCV: 91.3 fL (ref 78.0–100.0)
PLATELETS: 159 10*3/uL (ref 150–400)
RBC: 3.79 MIL/uL — ABNORMAL LOW (ref 4.22–5.81)
RDW: 15.2 % (ref 11.5–15.5)
WBC: 5.7 10*3/uL (ref 4.0–10.5)

## 2017-10-19 LAB — BASIC METABOLIC PANEL
Anion gap: 11 (ref 5–15)
BUN: 13 mg/dL (ref 6–20)
CALCIUM: 8.7 mg/dL — AB (ref 8.9–10.3)
CHLORIDE: 104 mmol/L (ref 101–111)
CO2: 25 mmol/L (ref 22–32)
CREATININE: 0.57 mg/dL — AB (ref 0.61–1.24)
GFR calc Af Amer: >60 mL/min (ref >60)
GFR calc non Af Amer: >60 mL/min (ref >60)
Glucose, Bld: 78 mg/dL (ref 65–99)
Potassium: 3.7 mmol/L (ref 3.5–5.1)
Sodium: 140 mmol/L (ref 135–145)

## 2017-10-19 LAB — PHOSPHORUS: Phosphorus: 2.9 mg/dL (ref 2.5–4.6)

## 2017-10-19 MED ORDER — POLYVINYL ALCOHOL 1.4 % OP SOLN
1.0000 [drp] | Freq: Every day | OPHTHALMIC | Status: DC
  Administered 2017-10-19 – 2017-11-06 (×19): 1 [drp] via OPHTHALMIC
  Filled 2017-10-19 (×3): qty 15

## 2017-10-19 NOTE — Progress Notes (Signed)
With Dr. Mariea Clonts with psychiatry.  And updated her about patient's improved state on Ativan.  She recommended continuing Ativan 3 times daily.  She plans to sign off.

## 2017-10-19 NOTE — Progress Notes (Signed)
Interim Progress Note: Examined patient this afternoon. Sitting up in bed. Mom in room and patient able to tell me that his mom is here to visit him today. Answering questions appropriately. States that he doesn't have much of an appetite. Alert and oriented to person and place. Appears comfortable. Will continue to monitor.  Hyman Bible, MD PGY-3

## 2017-10-19 NOTE — Progress Notes (Signed)
Family Medicine Teaching Service Daily Progress Note Intern Pager: [phone]  Patient name: Franklin Ayers Medical record number: [id number] Date of birth: March 30, 1961 Age: 57 y.o. Gender: male  Primary Care Provider: Barrie Lyme, FNP Consultants: Psychiatry Code Status: Full  Pt Overview and Major Events to Date:  Franklin Ayers is a 57 y.o. male presenting with altered mental status and a UTI. PMH is significant for schizoaffective disorder.  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  He is now oriented to self and place.  He yesterday he was able to recall what he ate and recognized me as his provider.  We will continue to monitor on Ativan.  -Ativan 1 mg, 3 times daily -Psychiatry conditions-inpatient management when medically stable - Depakote 500mg  in the AM and 1000mg  in the PM and Seroquel 300mg  qhs - Will monitor mental status closely  Moderate malnutrition due to chronic illness  Improve p.o. intake with the scheduled Ativan.  Assessed by nutrition, recommended nutritional supplement and Magic cups.  Mildly low phosphorus at 2.3.  Will replete as necessary.  Plan to recheck phosphorus.  Other electrolytes normal.  MG normal at 1.9. -Appreciate psychiatry recommendations -Nutrition consult appreciated -Assist with feeds -Nutritional supplement  -Check BMP, phosphorus  Acute urinary retention  DDX: Medication induced, psychogenic,UTI, BPH.  Patient has history of intermittently taking Seroquel at home.  Does have some anticholinergic side effects, although not on a very high dose, may be leading to acute urinary retention.    -Keep indwelling Foley for 1 week (inserted on 2/6), spontaneous voiding trial removal. -Appreciate psychiatry recommendations -Continue tamsulosin 0.4 mg -Strict I's and O's  UTI with hematuria: Urine culture showed greater  than 100K STAPHYLOCOCCUS LUGDUNENSIS, susceptible to Bactrim. Currently treated with Bactrim, patient.  He has had normal urine output.  Hemoglobin stable around 1.5.   -day 6 of 7 on Bactrim 800-160mg  bid  - Strict I/O -Recommend rechecking UA after completing treatment of UTI, to assess for clearance and persistent hematuria.  Recommend further workup if hematuria still present.  Eye pain Does not appear infected such as blepharitis. 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  Will start lubricating eyedrops and warm compresses as needed. -Tylenol as needed  Social: Spoke with patient's mother.  She feels that she is unable to cope with taking care of him upon discharge.  She requested placement in an assisted living.  FEN/GI:  Soft diet due to poor dentition Prophylaxis: Lovenox  Disposition: Medically stable for discharge to inpatient psych  Subjective:  Awake in bed.  Oriented to self and place.  Endorses some eye pain.  Objective: Temp:  [97.5 F (36.4 C)-98.6 F (37 C)] 97.5 F (36.4 C) (02/07 0500) Pulse Rate:  [72-88] 72 (02/07 0500) Resp:  [18] 18 (02/07 0500) BP: (99-112)/(59-64) 103/64 (02/07 0500) SpO2:  [95 %-99 %] 97 % (02/07 0500) Physical Exam: General: Awake in bed, no acute distress HEENT: Eyes are in icteric, not injected, dried Rheum on eyelashes, oily appearing discharge in right eye Cardiovascular: RRR, no MRG Respiratory: CTAB, normal work of breathing Abdomen: No CVA tenderness soft nontender nondistended Extremities: No edema, 2+ DP Neuro/psych: Making eye contact, oriented to self and place.  laboratory: Recent Labs  Lab 10/16/17 1021 10/18/17 0941 10/19/17 0306  WBC 7.4 3.5* 5.7  HGB 11.9* 11.6* 11.5*  HCT 35.3* 35.5* 34.6*  PLT 174 145* 159   Recent Labs  Lab 10/14/17 1120  10/17/17 0321 10/18/17 0941 10/19/17 0306  NA 143   < > 142 138 140  K 3.5   < > 4.2 4.5 3.7  CL 108   < > 108 105 104  CO2 21*   < > 23 23 25   BUN 13   < > 13 12 13   CREATININE  0.64   < > 0.66 0.76 0.57*  CALCIUM 9.4   < > 9.2 9.2 8.7*  PROT 7.1  --   --   --   --   BILITOT 1.0  --   --   --   --   ALKPHOS 71  --   --   --   --   ALT 18  --   --   --   --   AST 20  --   --   --   --   GLUCOSE 102*   < > 84 150* 78   < > = values in this interval not displayed.   Bonnita Hollow, MD 10/19/2017, 9:28 AM PGY-1, [place] Intern pager: [phone], text pages welcome

## 2017-10-20 ENCOUNTER — Encounter (HOSPITAL_COMMUNITY): Payer: Self-pay | Admitting: *Deleted

## 2017-10-20 ENCOUNTER — Other Ambulatory Visit: Payer: Self-pay

## 2017-10-20 LAB — BASIC METABOLIC PANEL
ANION GAP: 12 (ref 5–15)
BUN: 7 mg/dL (ref 6–20)
CALCIUM: 9.2 mg/dL (ref 8.9–10.3)
CO2: 27 mmol/L (ref 22–32)
Chloride: 100 mmol/L — ABNORMAL LOW (ref 101–111)
Creatinine, Ser: 0.68 mg/dL (ref 0.61–1.24)
GFR calc Af Amer: >60 mL/min (ref >60)
Glucose, Bld: 80 mg/dL (ref 65–99)
Potassium: 3.5 mmol/L (ref 3.5–5.1)
Sodium: 139 mmol/L (ref 135–145)

## 2017-10-20 NOTE — Progress Notes (Addendum)
Pt drowsy, difficult to arouse. Able to state name after stimulation, drifts quickly back to sleep. No other acute neurological findings. Dr. Garlan Fillers made aware, will monitor closely. Will hold HS dose of  ativan if pt remains drowsy, per provider.  Addendum: 2220: Pt resting, however more easily aroused and verbal. Ativan administered as ordered. Will monitor

## 2017-10-20 NOTE — Progress Notes (Signed)
Family Medicine Teaching Service Daily Progress Note Intern Pager: [phone]  Patient name: Franklin Ayers Medical record number: [id number] Date of birth: August 06, 1961 Age: 57 y.o. Gender: male  Primary Care Provider: Barrie Lyme, FNP Consultants: Psychiatry Code Status: Full  Pt Overview and Major Events to Date:  Franklin Ayers is a 57 y.o. male presenting with altered mental status and a UTI. PMH is significant for schizoaffective disorder.  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  He is now oriented to self and place.  Up and eating when seen in the room this afternoon. More sleepy this morning but arousable. We will continue to monitor on Ativan.  -Ativan 1 mg, 3 times daily -Psychiatry conditions-inpatient management when medically stable - Depakote 500mg  in the AM and 1000mg  in the PM and Seroquel 300mg  qhs - Will monitor mental status closely  Moderate malnutrition due to chronic illness  Improve p.o. intake with the scheduled Ativan.  Assessed by nutrition, recommended nutritional supplement and Magic cups.  Phosphorus normalized at 2.9.  Other electrolytes normal.  MG normal at 1.9. -Appreciate psychiatry recommendations -Nutrition consult appreciated -Assist with feeds -Nutritional supplement  -Check BMP, phosphorus  Acute urinary retention  DDX: Medication induced, psychogenic,UTI, BPH.  Patient has history of intermittently taking Seroquel at home.  Does have some anticholinergic side effects, although not on a very high dose, may be leading to acute urinary retention.   - Remove foley and do voiding trial -Appreciate psychiatry recommendations -Continue tamsulosin 0.4 mg -Strict I's and O's  UTI with hematuria: Urine culture showed greater than 100K STAPHYLOCOCCUS LUGDUNENSIS, susceptible to Bactrim. Currently treated with Bactrim, patient.   He has had normal urine output.  Hemoglobin stable around 1.5.   -day 7 of 7 on Bactrim 800-160mg  bid  -Recommend rechecking UA after completing treatment of UTI, to assess for clearance and persistent hematuria.  Recommend further workup if hematuria still present.  Superficial wound below umbilicus: Acute. Patient is afebrile with an elevated WBC, and states it does not hurt. He shows no signs of pain when being palpated. - Start Bactroban 2% cream to area  Eye pain: Acute. Improved.  Does not appear infected such as blepharitis. 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  Will start lubricating eyedrops and warm compresses as needed. -Tylenol as needed - cont lubricating eyedrops  Social: Spoke with patient's mother.  She feels that she is unable to cope with taking care of him upon discharge.  She requested placement in an assisted living.  FEN/GI:  Soft diet due to poor dentition Prophylaxis: Lovenox  Disposition: Medically stable for discharge to inpatient psych  Subjective:  Awake in bed.  Oriented to self and place. He is sleepy this morning but awakens to his name being called. He is difficult to understand this am but when I checked on him again this afternoon he was much more awake and interactive. He had no complaints and stated he was doing "ok".  Objective: Temp:  [98.5 F (36.9 C)-98.6 F (37 C)] 98.6 F (37 C) (02/08 0517) Pulse Rate:  [66-77] 77 (02/08 0517) Resp:  [16-18] 18 (02/08 0517) BP: (99-108)/(61-84) 99/61 (02/08 0517) SpO2:  [98 %-100 %] 99 % (02/08 0517) Physical Exam: General: Awake in bed, no acute distress HEENT: no scleral icterus, EOMI, PERRLA Cardiovascular: RRR, no MRG Respiratory: CTAB, normal work of breathing Abdomen: No CVA tenderness soft nontender nondistended; belly button has area of  erythema with mild skin breakdown and pus in the belly button. Extremities: No edema, 2+ DP Neuro/psych: Making eye contact, oriented to self and place.  laboratory: Recent  Labs  Lab 10/16/17 1021 10/18/17 0941 10/19/17 0306  WBC 7.4 3.5* 5.7  HGB 11.9* 11.6* 11.5*  HCT 35.3* 35.5* 34.6*  PLT 174 145* 159   Recent Labs  Lab 10/14/17 1120  10/18/17 0941 10/19/17 0306 10/20/17 0555  NA 143   < > 138 140 139  K 3.5   < > 4.5 3.7 3.5  CL 108   < > 105 104 100*  CO2 21*   < > 23 25 27   BUN 13   < > 12 13 7   CREATININE 0.64   < > 0.76 0.57* 0.68  CALCIUM 9.4   < > 9.2 8.7* 9.2  PROT 7.1  --   --   --   --   BILITOT 1.0  --   --   --   --   ALKPHOS 71  --   --   --   --   ALT 18  --   --   --   --   AST 20  --   --   --   --   GLUCOSE 102*   < > 150* 78 80   < > = values in this interval not displayed.   Nuala Alpha, DO 10/20/2017, 7:17 AM PGY-1, [place] Intern pager: [phone], text pages welcome

## 2017-10-20 NOTE — Progress Notes (Signed)
Discussed case with medical director. Inpatient psych facilities unable to accept patient with a foley and monitoring strict I's and O's. CSW will await medical readiness to refer patient out.   Percell Locus Mahagony Grieb LCSW [phone]

## 2017-10-20 NOTE — Progress Notes (Signed)
Urinary catheter removed per MD order. Time of catheter removal 1640. Will monitor patient for voiding. Manya Silvas B

## 2017-10-21 ENCOUNTER — Inpatient Hospital Stay (HOSPITAL_COMMUNITY): Payer: Medicare Other

## 2017-10-21 ENCOUNTER — Encounter (HOSPITAL_COMMUNITY): Payer: Self-pay | Admitting: Radiology

## 2017-10-21 DIAGNOSIS — R319 Hematuria, unspecified: Secondary | ICD-10-CM

## 2017-10-21 DIAGNOSIS — L089 Local infection of the skin and subcutaneous tissue, unspecified: Secondary | ICD-10-CM

## 2017-10-21 DIAGNOSIS — F202 Catatonic schizophrenia: Principal | ICD-10-CM

## 2017-10-21 LAB — BASIC METABOLIC PANEL
Anion gap: 11 (ref 5–15)
BUN: 7 mg/dL (ref 6–20)
CO2: 28 mmol/L (ref 22–32)
Calcium: 9 mg/dL (ref 8.9–10.3)
Chloride: 98 mmol/L — ABNORMAL LOW (ref 101–111)
Creatinine, Ser: 0.77 mg/dL (ref 0.61–1.24)
GFR calc Af Amer: >60 mL/min (ref >60)
GFR calc non Af Amer: >60 mL/min (ref >60)
Glucose, Bld: 73 mg/dL (ref 65–99)
POTASSIUM: 3.7 mmol/L (ref 3.5–5.1)
Sodium: 137 mmol/L (ref 135–145)

## 2017-10-21 LAB — URINALYSIS, ROUTINE W REFLEX MICROSCOPIC
Bilirubin Urine: NEGATIVE
GLUCOSE, UA: NEGATIVE mg/dL
Hgb urine dipstick: NEGATIVE
Ketones, ur: NEGATIVE mg/dL
LEUKOCYTES UA: NEGATIVE
Nitrite: NEGATIVE
PH: 6 (ref 5.0–8.0)
Protein, ur: NEGATIVE mg/dL
Specific Gravity, Urine: 1.013 (ref 1.005–1.030)

## 2017-10-21 LAB — MRSA PCR SCREENING: MRSA by PCR: NEGATIVE

## 2017-10-21 MED ORDER — DOXYCYCLINE HYCLATE 100 MG PO TABS
100.0000 mg | ORAL_TABLET | Freq: Two times a day (BID) | ORAL | Status: AC
  Administered 2017-10-21 – 2017-10-25 (×8): 100 mg via ORAL
  Filled 2017-10-21 (×9): qty 1

## 2017-10-21 MED ORDER — TAMSULOSIN HCL 0.4 MG PO CAPS
0.8000 mg | ORAL_CAPSULE | Freq: Every day | ORAL | Status: DC
  Administered 2017-10-23 – 2017-11-06 (×15): 0.8 mg via ORAL
  Filled 2017-10-21 (×15): qty 2

## 2017-10-21 MED ORDER — IOPAMIDOL (ISOVUE-300) INJECTION 61%
INTRAVENOUS | Status: AC
  Administered 2017-10-21: 100 mL
  Filled 2017-10-21: qty 100

## 2017-10-21 NOTE — Progress Notes (Signed)
Patient more awake and talking some when asked questions will give short response. Contrast started at 1430.

## 2017-10-21 NOTE — Progress Notes (Signed)
Patient unable to void. Bladder scan showed 470ml. I & O cath drained out 45ml.

## 2017-10-21 NOTE — Progress Notes (Signed)
Family Medicine Teaching Service Daily Progress Note Intern Pager: [phone]  Patient name: Franklin Ayers Medical record number: [id number] Date of birth: August 18, 1961 Age: 57 y.o. Gender: male  Primary Care Provider: Barrie Lyme, FNP Consultants: Psychiatry Code Status: Full  Pt Overview and Major Events to Date:  Franklin Ayers is a 57 y.o. male presenting with altered mental status and a UTI. PMH is significant for schizoaffective disorder.  Assessment and Plan:  Catatonia  Acute psychosis  Schizoaffective disorder, improved Patient appears more catatonic this a.m.  Not responding verbally.  Evening dose of Ativan was held due to more depressed mental status.  This may be contributing to presentation exam.  Continue to monitor mental status. -Ativan 1 mg, 3 times daily -Psychiatry conditions-inpatient management when medically stable - Depakote 500mg  in the AM and 1000mg  in the PM and Seroquel 300mg  qhs - Will monitor mental status closely  Acute urinary retention  Unable to have inpatient psychiatric placement with Foley in place due to potential suicide risk of Foley. DDX: Medication induced, psychogenic,UTI, BPH.  Patient has history of intermittently taking Seroquel at home.  Does have some anticholinergic side effects, although not on a very high dose, may be leading to acute urinary retention.  Patient had Foley removed yesterday afternoon.  Did not void for 10 hours.  Bladder scan had greater than 300.  I/O cathed.  We will continue to attempt a void trial -Increase tamsulosin to 0.8 mg -Strict I's and O's -Every 8 hour bladder scans  Soft tissue infection: Was able to express what appeared to be a mild amount of pus within the umbilicus.  Mild erythematous, not warm. I do not palpate any nodule consistent with abscess.  Given the patient just completed course of Bactrim, concerned that patient may have resistant strain.  Will obtain wound culture, nasal swab for MRSA.   Continue topical Bactroban. Start doxycycline for 5-day course.  Acute. Patient is afebrile with an elevated WBC, and states it does not hurt. He shows no signs of pain when being palpated. -Continue Bactroban 2% cream to area, daily clean wound with clean room and apply adhesive bandage -Consider Keflex if does not improve  -CBC  UTI with hematuria: Urine culture showed greater than 100K STAPHYLOCOCCUS LUGDUNENSIS, susceptible to Bactrim. Currently treated with Bactrim, patient.  He has had normal urine output.  Hemoglobin stable around 1.5.   -day 7 of 7 on Bactrim 800-160mg  bid  -We will check UA to follow-up hematuria, concern may be false positive given recurrent in out and Foley placement catheters -Recommend further workup if hematuria still present.  Moderate malnutrition due to chronic illness  Improve p.o. intake with the scheduled Ativan.  Assessed by nutrition, recommended nutritional supplement and Magic cups.   -Nutrition consult appreciated -Assist with feeds -Nutritional supplement  -Daily BMP  Eye pain: . Improved.  Does not appear infected such as blepharitis. 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   -Tylenol as needed - cont lubricating eyedrops and warm compresses as needed  Social: Spoke with patient's mother.  She feels that she is unable to cope with taking care of him upon discharge.  She requested placement in an assisted living.  FEN/GI:  Soft diet due to poor dentition Prophylaxis: Lovenox  Disposition: Medically stable for discharge to inpatient psych  Subjective:  Awake in bed.  Is not oriented to self, place, situation.  We will not answer yes/no questions.  Does respond to name.    Objective: Temp:  [97.6 F (36.4  C)-98.6 F (37 C)] 98.1 F (36.7 C) (02/09 0558) Pulse Rate:  [79-92] 80 (02/09 0558) Resp:  [16] 16 (02/08 2053) BP: (94-97)/(58-69) 95/62 (02/09 0558) SpO2:  [98 %-99 %] 98 % (02/09 0558) Weight:  [127 lb 6.4 oz (57.8 kg)] 127 lb 6.4 oz (57.8 kg)  (02/08 1651) Physical Exam: General: Awake in bed, no acute distress Cardiovascular: RRR, no MRG Respiratory: CTAB, normal work of breathing Abdomen: No CVA tenderness soft nontender nondistended; mild purulent discharge within naval.  Mild erythema around the navel.  Extremities: No edema, 2+ DP Neuro/psych: Patient grimacing, some foot twitching,  laboratory: Recent Labs  Lab 10/16/17 1021 10/18/17 0941 10/19/17 0306  WBC 7.4 3.5* 5.7  HGB 11.9* 11.6* 11.5*  HCT 35.3* 35.5* 34.6*  PLT 174 145* 159   Recent Labs  Lab 10/14/17 1120  10/19/17 0306 10/20/17 0555 10/21/17 0422  NA 143   < > 140 139 137  K 3.5   < > 3.7 3.5 3.7  CL 108   < > 104 100* 98*  CO2 21*   < > 25 27 28   BUN 13   < > 13 7 7   CREATININE 0.64   < > 0.57* 0.68 0.77  CALCIUM 9.4   < > 8.7* 9.2 9.0  PROT 7.1  --   --   --   --   BILITOT 1.0  --   --   --   --   ALKPHOS 71  --   --   --   --   ALT 18  --   --   --   --   AST 20  --   --   --   --   GLUCOSE 102*   < > 78 80 73   < > = values in this interval not displayed.   Bonnita Hollow, MD 10/21/2017, 9:11 AM PGY-1, [place] Intern pager: [phone], text pages welcome

## 2017-10-21 NOTE — Progress Notes (Addendum)
Pt has not voided since F/C discontinued at 1640. 225 cc on bladder scan. Pt denies discomfort. Minimal po intake as pt has drowsy. Will encourage po. Dr. Grandville Silos made aware, no new orders. Urinal at bedside, will monitor.  0515: Pt still has not voided, 300 cc on bladder scan. Encouraged fluids, when pt is awake, still poor po. Dr. Grandville Silos made aware will I/O cath

## 2017-10-21 NOTE — Progress Notes (Signed)
Spoke with [facility] imaging.  They recommended getting CT abdomen with and without contrast to evaluate for abscess tracking.  Will obtain CT abdomen as recommended.

## 2017-10-22 ENCOUNTER — Inpatient Hospital Stay (HOSPITAL_COMMUNITY): Payer: Medicare Other

## 2017-10-22 DIAGNOSIS — E440 Moderate protein-calorie malnutrition: Secondary | ICD-10-CM

## 2017-10-22 LAB — BASIC METABOLIC PANEL
Anion gap: 11 (ref 5–15)
BUN: 11 mg/dL (ref 6–20)
CHLORIDE: 98 mmol/L — AB (ref 101–111)
CO2: 28 mmol/L (ref 22–32)
Calcium: 9 mg/dL (ref 8.9–10.3)
Creatinine, Ser: 0.66 mg/dL (ref 0.61–1.24)
GFR calc non Af Amer: >60 mL/min (ref >60)
Glucose, Bld: 84 mg/dL (ref 65–99)
POTASSIUM: 4.1 mmol/L (ref 3.5–5.1)
SODIUM: 137 mmol/L (ref 135–145)

## 2017-10-22 LAB — BLOOD GAS, ARTERIAL
ACID-BASE EXCESS: 6.8 mmol/L — AB (ref 0.0–2.0)
Bicarbonate: 31 mmol/L — ABNORMAL HIGH (ref 20.0–28.0)
DRAWN BY: 521601
FIO2: 21
O2 Saturation: 95.3 %
PH ART: 7.449 (ref 7.350–7.450)
Patient temperature: 97.7
pCO2 arterial: 45.1 mmHg (ref 32.0–48.0)
pO2, Arterial: 76.7 mmHg — ABNORMAL LOW (ref 83.0–108.0)

## 2017-10-22 LAB — COMPREHENSIVE METABOLIC PANEL
ALBUMIN: 3.1 g/dL — AB (ref 3.5–5.0)
ALT: 19 U/L (ref 17–63)
ANION GAP: 14 (ref 5–15)
AST: 19 U/L (ref 15–41)
Alkaline Phosphatase: 64 U/L (ref 38–126)
BUN: 10 mg/dL (ref 6–20)
CHLORIDE: 101 mmol/L (ref 101–111)
CO2: 24 mmol/L (ref 22–32)
Calcium: 9.2 mg/dL (ref 8.9–10.3)
Creatinine, Ser: 0.63 mg/dL (ref 0.61–1.24)
GFR calc Af Amer: >60 mL/min (ref >60)
Glucose, Bld: 78 mg/dL (ref 65–99)
POTASSIUM: 4.1 mmol/L (ref 3.5–5.1)
Sodium: 139 mmol/L (ref 135–145)
Total Bilirubin: 0.6 mg/dL (ref 0.3–1.2)
Total Protein: 6.6 g/dL (ref 6.5–8.1)

## 2017-10-22 LAB — CBC
HCT: 34.4 % — ABNORMAL LOW (ref 39.0–52.0)
HEMATOCRIT: 34.9 % — AB (ref 39.0–52.0)
HEMOGLOBIN: 11.3 g/dL — AB (ref 13.0–17.0)
HEMOGLOBIN: 11.7 g/dL — AB (ref 13.0–17.0)
MCH: 31.4 pg (ref 26.0–34.0)
MCH: 32.5 pg (ref 26.0–34.0)
MCHC: 32.8 g/dL (ref 30.0–36.0)
MCHC: 33.5 g/dL (ref 30.0–36.0)
MCV: 95.6 fL (ref 78.0–100.0)
MCV: 96.9 fL (ref 78.0–100.0)
Platelets: UNDETERMINED 10*3/uL (ref 150–400)
Platelets: DECREASED 10*3/uL (ref 150–400)
RBC: 3.6 MIL/uL — AB (ref 4.22–5.81)
RBC: 3.6 MIL/uL — ABNORMAL LOW (ref 4.22–5.81)
RDW: 15.3 % (ref 11.5–15.5)
RDW: 15.2 % (ref 11.5–15.5)
WBC: 7.2 10*3/uL (ref 4.0–10.5)
WBC: 6.5 10*3/uL (ref 4.0–10.5)

## 2017-10-22 LAB — TROPONIN I: Troponin I: <0.03 ng/mL (ref <0.03)

## 2017-10-22 LAB — VALPROIC ACID LEVEL: VALPROIC ACID LVL: 78 ug/mL (ref 50.0–100.0)

## 2017-10-22 LAB — GLUCOSE, CAPILLARY: GLUCOSE-CAPILLARY: 125 mg/dL — AB (ref 65–99)

## 2017-10-22 LAB — AMMONIA: Ammonia: 47 umol/L — ABNORMAL HIGH (ref 9–35)

## 2017-10-22 NOTE — Progress Notes (Signed)
Family Medicine Teaching Service Daily Progress Note Intern Pager: [phone]  Patient name: Franklin Ayers Medical record number: [id number] Date of birth: [birth year] Age: 57 y.o. Gender: male  Primary Care Provider: Barrie Lyme, FNP Consultants: Psychiatry Code Status: Full  Pt Overview and Major Events to Date:  Franklin Ayers is a 57 y.o. male presenting with altered mental status and a UTI. PMH is significant for schizoaffective disorder.  Assessment and Plan:  Altered mental status Unsure if this is waning mental status from schizoaffective disorder versus another organic etiology.  Patient was somnolent this a.m. was arousable with sternal rub to briefly nod yes to question.  Otherwise could not awaken.  Was moving lower extremities as a fidgeting, did not appear to be tremulous or as a seizure nature.  Had dilated pupils that were reactive bilaterally.  Was hemodynamically stable and afebrile.  Patient had not had his a.m. dose of Ativan or other psychiatric medications. Given change in mental status with new neuro findings, broad workup was initiated including CBG check which was normal.  ABG was unremarkable.  CMP, valproate levels, and CBC normal.  Ammonia was pending head CT was negative for acute stroke.  EKG pending -Every hour neuro checks -Follow neuro exam, if persist consider psychiatry versus neurology consult  Catatonia  Acute psychosis  Schizoaffective disorder -Ativan 1 mg, 3 times daily -Psychiatry conditions-inpatient management when medically stable -Depakote 500mg  in the AM and 1000mg  in the PM and Seroquel 300mg  qhs -Will monitor mental status closely  Acute urinary retention secondary to BPH, improved -tamsulosin to 0.8 mg -Strict I's and O's  Soft tissue infection with purulent drainage: CT abdomen negative for tracking of abscess.  Continue doxycycline for 5-day course.  Nasal swab negative for staph. Patient is afebrile with out leukocytosis, and  states it does not hurt. He shows no signs of pain when being palpated. -Wound cultures -Continue Bactroban 2% cream to area, daily clean wound with clean room and apply adhesive bandage -Daily CBC  UTI with hematuria:-Resolved urine culture showed greater than 100K STAPHYLOCOCCUS LUGDUNENSIS, susceptible to Bactrim.  Completed 7-day course of Bactrim.  Repeat UA was negative for infection or hematuria.   Moderate malnutrition due to chronic illness  -Nutrition consult appreciated -Assist with feeds -Nutritional supplement  -Daily BMP  Sicca,  Improved.  - cont lubricating eyedrops and warm compresses as needed  Social: Spoke with patient's mother.  She feels that she is unable to cope with taking care of him upon discharge.  She requested placement in an assisted living.  Behavioral health recommended geriatric psych placement.  Patient had barrier to placement psychiatric facility due to Foley placed as this is potential suicide risk from use of Foley catheter.  The patient was able to pass a void trial and Foley was DC'd.  Patient can be placed -Follow-up with social work for geriatric psych unit placement  FEN/GI:  Soft diet due to poor dentition Prophylaxis: Lovenox  Disposition: Medically stable for discharge to inpatient psych  Subjective:  Altered mental status.  Objective: Temp:  [97.7 F (36.5 C)-99.1 F (37.3 C)] 97.7 F (36.5 C) (02/10 0911) Pulse Rate:  [68-87] 85 (02/10 0911) Resp:  [12-14] 13 (02/10 0600) BP: (83-113)/(54-77) 113/77 (02/10 0911) SpO2:  [97 %-99 %] 99 % (02/10 0911) Physical Exam: General: Somnolent, awake to aggressive sternal rub, shook head with yes response, but then became somnolent again Cardiovascular: RRR, no MRG Respiratory: CTAB, normal work of breathing Abdomen: No CVA tenderness soft nontender  nondistended; mild purulent discharge within naval.  Mild erythema around the navel.  Extremities: No edema, 2+ DP Neuro/psych: Altered  mental status, not responding to questioning, moves lower extremities spontaneously, bilaterally pupils dilated to 5 mm reactive to light bilaterally   laboratory: Recent Labs  Lab 10/19/17 0306 10/22/17 0343 10/22/17 0846  WBC 5.7 7.2 6.5  HGB 11.5* 11.3* 11.7*  HCT 34.6* 34.4* 34.9*  PLT 159 PLATELET CLUMPS NOTED ON SMEAR, UNABLE TO ESTIMATE PENDING   Recent Labs  Lab 10/20/17 0555 10/21/17 0422 10/22/17 0343  NA 139 137 137  K 3.5 3.7 4.1  CL 100* 98* 98*  CO2 27 28 28   BUN 7 7 11   CREATININE 0.68 0.77 0.66  CALCIUM 9.2 9.0 9.0  GLUCOSE 80 73 84   Bonnita Hollow, MD 10/22/2017, 10:28 AM PGY-1, [place] Intern pager: [phone], text pages welcome

## 2017-10-22 NOTE — Progress Notes (Signed)
Patient is too drowsy to tolerate PO medications. Called the on call MD to notify.

## 2017-10-23 DIAGNOSIS — N400 Enlarged prostate without lower urinary tract symptoms: Secondary | ICD-10-CM

## 2017-10-23 LAB — BASIC METABOLIC PANEL
Anion gap: 13 (ref 5–15)
BUN: 8 mg/dL (ref 6–20)
CHLORIDE: 101 mmol/L (ref 101–111)
CO2: 25 mmol/L (ref 22–32)
CREATININE: 0.54 mg/dL — AB (ref 0.61–1.24)
Calcium: 9 mg/dL (ref 8.9–10.3)
GFR calc Af Amer: >60 mL/min (ref >60)
GFR calc non Af Amer: >60 mL/min (ref >60)
Glucose, Bld: 83 mg/dL (ref 65–99)
POTASSIUM: 4.1 mmol/L (ref 3.5–5.1)
Sodium: 139 mmol/L (ref 135–145)

## 2017-10-23 LAB — CBC
HEMATOCRIT: 33.7 % — AB (ref 39.0–52.0)
HEMOGLOBIN: 11.2 g/dL — AB (ref 13.0–17.0)
MCH: 31.9 pg (ref 26.0–34.0)
MCHC: 33.2 g/dL (ref 30.0–36.0)
MCV: 96 fL (ref 78.0–100.0)
Platelets: DECREASED 10*3/uL (ref 150–400)
RBC: 3.51 MIL/uL — ABNORMAL LOW (ref 4.22–5.81)
RDW: 15.3 % (ref 11.5–15.5)
WBC: 5.4 10*3/uL (ref 4.0–10.5)

## 2017-10-23 NOTE — Progress Notes (Deleted)
Per SNF SSN is: [place] LCSW [phone]

## 2017-10-23 NOTE — Progress Notes (Signed)
Per Medical Director, patient to be placed on [facility] waitlist.   Cedric Fishman LCSW [phone]

## 2017-10-23 NOTE — Progress Notes (Addendum)
[facility] stating they do not accept patient under 55 years of age even if patient is not ambulatory.   CSW notified that patient currently not needing foley. [facility] has denied patient due to patient not being ambulatory enough. CSW to continue to search for placement.    Franklin Locus Shannia Jacuinde LCSW [phone]

## 2017-10-23 NOTE — Evaluation (Signed)
Physical Therapy Evaluation Patient Details Name: Franklin Ayers MRN: [id number] DOB: 11-Jun-1961 Today's Date: 10/23/2017   History of Present Illness  Isaac Dubie a 57 y.o.malepresenting with altered mental status and a UTI. PMH is significant forschizoaffective disorder. Unsure of compliance with medications.  Clinical Impression  Pt non-verbal with exception of stating "weak" and "no" at the end of session in response to questions. Pt with no initiation of movement however did no resist PT during transfers and ambulation. Pt with improved participation as session went on. Pt unsafe to return home if 24/7 ASSIST can not be provided. Pt will most likely need ST-SNF to achieve safe supervision level of assist.    Follow Up Recommendations SNF;Supervision/Assistance - 24 hour    Equipment Recommendations  (TBD at next venue)    Recommendations for Other Services       Precautions / Restrictions Precautions Precautions: Fall Precaution Comments: non-verbal Restrictions Weight Bearing Restrictions: No      Mobility  Bed Mobility Overal bed mobility: Needs Assistance Bed Mobility: Supine to Sit;Sit to Supine     Supine to sit: Max assist(due to no initiation) Sit to supine: Supervision   General bed mobility comments: pt returned self to bed and scoot up higher in bed to command  Transfers Overall transfer level: Needs assistance Equipment used: 2 person hand held assist Transfers: Sit to/from Stand Sit to Stand: Min assist;Mod assist;+2 physical assistance         General transfer comment: pt with no initiation of transfer, max tactile and verbal cues required  Ambulation/Gait Ambulation/Gait assistance: Mod assist;+2 physical assistance Ambulation Distance (Feet): 150 Feet Assistive device: 2 person hand held assist Gait Pattern/deviations: Step-through pattern;Decreased stride length;Narrow base of support;Staggering left Gait velocity: slow Gait  velocity interpretation: Below normal speed for age/gender General Gait Details: pt with strong lean to the left, pt stepping on heals, v/c's to tell pt to look forward and try to stay in the middle, pt however unable to maintain  Stairs            Wheelchair Mobility    Modified Rankin (Stroke Patients Only)       Balance Overall balance assessment: Needs assistance Sitting-balance support: Bilateral upper extremity supported Sitting balance-Leahy Scale: Poor Sitting balance - Comments: pt with minimal initiation of mobility   Standing balance support: Bilateral upper extremity supported Standing balance-Leahy Scale: Poor Standing balance comment: dependent on external support                             Pertinent Vitals/Pain Pain Assessment: No/denies pain(pt shook head no to question)    Home Living Family/patient expects to be discharged to:: Unsure Living Arrangements: Parent               Additional Comments: was living with mother however per chart mother states she can't handle him anymore in this condition    Prior Function Level of Independence: Needs assistance         Comments: unsure of pt's PLOF as pt is non-verbal and mother not available.     Hand Dominance        Extremity/Trunk Assessment   Upper Extremity Assessment Upper Extremity Assessment: Generalized weakness    Lower Extremity Assessment Lower Extremity Assessment: Generalized weakness    Cervical / Trunk Assessment Cervical / Trunk Assessment: Kyphotic  Communication   Communication: (non-verbal, occasional nod of the head for yes/no)  Cognition Arousal/Alertness: Lethargic Behavior  During Therapy: Flat affect Overall Cognitive Status: History of cognitive impairments - at baseline                                 General Comments: h/o schizophrenia      General Comments      Exercises     Assessment/Plan    PT Assessment Patient  needs continued PT services  PT Problem List Decreased strength;Decreased activity tolerance;Decreased balance;Decreased mobility;Decreased coordination;Decreased cognition;Decreased knowledge of use of DME;Decreased safety awareness       PT Treatment Interventions DME instruction;Gait training;Functional mobility training;Therapeutic activities;Therapeutic exercise;Balance training;Neuromuscular re-education;Cognitive remediation    PT Goals (Current goals can be found in the Care Plan section)  Acute Rehab PT Goals Patient Stated Goal: didn't state PT Goal Formulation: Patient unable to participate in goal setting Time For Goal Achievement: 11/06/17 Potential to Achieve Goals: Fair    Frequency Min 2X/week   Barriers to discharge Decreased caregiver support mother unable to care of him    Co-evaluation               AM-PAC PT "6 Clicks" Daily Activity  Outcome Measure Difficulty turning over in bed (including adjusting bedclothes, sheets and blankets)?: Unable Difficulty moving from lying on back to sitting on the side of the bed? : Unable Difficulty sitting down on and standing up from a chair with arms (e.g., wheelchair, bedside commode, etc,.)?: Unable Help needed moving to and from a bed to chair (including a wheelchair)?: A Lot Help needed walking in hospital room?: A Lot Help needed climbing 3-5 steps with a railing? : Total 6 Click Score: 8    End of Session Equipment Utilized During Treatment: Gait belt Activity Tolerance: Patient tolerated treatment well Patient left: in bed;with call bell/phone within reach;with bed alarm set Nurse Communication: Mobility status PT Visit Diagnosis: Unsteadiness on feet (R26.81);History of falling (Z91.81)    Time: 0370-4888 PT Time Calculation (min) (ACUTE ONLY): 15 min   Charges:   PT Evaluation $PT Eval Moderate Complexity: 1 Mod     PT G Codes:        Kittie Plater, PT, DPT Pager #: [phone] Office #:  [phone]   Eletha Culbertson M Malaika Arnall 10/23/2017, 3:03 PM

## 2017-10-23 NOTE — Progress Notes (Signed)
Patient knew full name and date of birth, also that he was in the hospital called the health system,He was able to pick meal options for tomorrow, and inform me that he is full from oatmeal.

## 2017-10-23 NOTE — Progress Notes (Signed)
Bladder scan at 12AM was 235mL, RN notified Thompson,MD. Thompson,MD placed one time order for in and out cath and informed this RN that they would address this patient's voiding issue later in the morning on dayshift.  At [facility] this RN completed an in and out cath with a second RN, P.J. Sexton. 700 mL of urine was drained. Towards the end of draining, white sediment came out through the tube mixed with urine. RN notified Thompson,MD and no new orders were placed. Will continue to monitor and treat per MD orders.

## 2017-10-23 NOTE — Progress Notes (Signed)
At 9PM, patient was very drowsy and BP was running soft. Paged on-call Thompson,MD at family medicine teaching services due to high doses of seroquel at bedtime with ativan.  Per Ryan,RN on dayshift, patient was not arowsable throughout the day and was unable to take any of his day time meds.  Thompson,MD returned my page and informed this RN to go ahead and administer all of the patient's nighttime meds except for the ativan due to the patient's BP being soft.  Patient was more alert and was able to take all of his nighttime medications by mouth. Ativan was held. Will continue to monitor and treat per MD orders.

## 2017-10-23 NOTE — Progress Notes (Signed)
Family Medicine Teaching Service Daily Progress Note Intern Pager: [phone]  Patient name: Franklin Ayers Medical record number: [id number] Date of birth: 11/11/1960 Age: 57 y.o. Gender: male  Primary Care Provider: Barrie Lyme, FNP Consultants: Psychiatry Code Status: Full  Pt Overview and Major Events to Date:  Franklin Ayers is a 57 y.o. male presenting with altered mental status and a UTI. PMH is significant for schizoaffective disorder.  Assessment and Plan:  Catatonia  Acute psychosis  Schizoaffective disorder Workup for acute mental waning status was negative.  This included normal CT head, CMP, CBC, normal valproic acid levels, mildly elevated ammonia at 47.  Good afternoon he was much improved, though this morning he was very somnolent. -Ativan 1 mg, 3 times daily -Psychiatry conditions-inpatient management when medically stable -Depakote 500mg  in the AM and 1000mg  in the PM and Seroquel 300mg  qhs -Will monitor mental status closely  Acute urinary retention secondary to BPH, improved Patient needed I&O overnight due to elevated bladder scan of 293 and not voiding for nearly 8 hours.  IN noting at 700 out.  Patient missed dose of Flomax, scheduled get 0.8 today.  When nursing did I know noticed white purulent discharge at the end.  Will repeat UA to rule out UTI. -tamsulosin to 0.8 mg -Strict I's and O's  Soft tissue infection with purulent drainage: CT abdomen negative for tracking of abscess.  Continue doxycycline for 5-day course.  Nasal swab negative for staph. Patient is afebrile with out leukocytosis, and states it does not hurt. He shows no signs of pain when being palpated. -Wound cultures moderate gram-positive rods -Continue Bactroban 2% cream to area, daily clean wound with clean room and apply adhesive bandage -Daily CBC -Doxycycline 100 mg twice daily day 3 of 5  UTI with hematuria:-Resolved urine culture showed greater than 100K STAPHYLOCOCCUS  LUGDUNENSIS, susceptible to Bactrim.  Completed 7-day course of Bactrim.  Repeat UA was negative for infection or hematuria.   Moderate malnutrition due to chronic illness  -Nutrition consult appreciated -Assist with feeds -Nutritional supplement  -Daily BMP  Sicca,  Improved.  - cont lubricating eyedrops and warm compresses as needed  Social: Spoke with patient's mother.  She feels that she is unable to cope with taking care of him upon discharge.  She requested placement in an assisted living.  Behavioral health recommended geriatric psych placement.  Patient had barrier to placement psychiatric facility due to Foley placed as this is potential suicide risk from use of Foley catheter.  The patient was able to pass a void trial and Foley was DC'd.  Patient can be placed -Follow-up with social work for geriatric psych unit placement  FEN/GI:  Soft diet due to poor dentition Prophylaxis: Lovenox  Disposition: Medically stable for discharge to inpatient psych  Subjective:  Altered mental status.  Not responding to yes and is today.  Objective: Temp:  [97.7 F (36.5 C)-98.6 F (37 C)] 97.7 F (36.5 C) (02/11 0435) Pulse Rate:  [68-86] 68 (02/11 0435) Resp:  [14-18] 14 (02/11 0435) BP: (91-113)/(60-74) 103/70 (02/11 0435) SpO2:  [96 %-98 %] 98 % (02/11 0435) Physical Exam: General: Somnolent, has my cuts covering heads Cardiovascular: RRR, no MRG Respiratory: CTAB, normal work of breathing Abdomen: No CVA tenderness soft nontender nondistended; mild purulent discharge within naval.  Mild erythema around the navel.  Extremities: No edema, 2+ DP Neuro/psych: Altered mental status, not responding to questioning, moves lower extremities spontaneously, bilaterally pupils dilated to 5 mm reactive to light bilaterally  laboratory: Recent Labs  Lab 10/22/17 0343 10/22/17 0846 10/23/17 0401  WBC 7.2 6.5 5.4  HGB 11.3* 11.7* 11.2*  HCT 34.4* 34.9* 33.7*  PLT PLATELET CLUMPS NOTED  ON SMEAR, UNABLE TO ESTIMATE PLATELET CLUMPS NOTED ON SMEAR, COUNT APPEARS DECREASED PLATELET CLUMPS NOTED ON SMEAR, COUNT APPEARS DECREASED   Recent Labs  Lab 10/22/17 0343 10/22/17 0846 10/23/17 0401  NA 137 139 139  K 4.1 4.1 4.1  CL 98* 101 101  CO2 28 24 25   BUN 11 10 8   CREATININE 0.66 0.63 0.54*  CALCIUM 9.0 9.2 9.0  PROT  --  6.6  --   BILITOT  --  0.6  --   ALKPHOS  --  64  --   ALT  --  19  --   AST  --  19  --   GLUCOSE 84 78 83   Bonnita Hollow, MD 10/23/2017, 9:47 AM PGY-1, [place] Intern pager: [phone], text pages welcome

## 2017-10-24 LAB — CBC
HEMATOCRIT: 36.2 % — AB (ref 39.0–52.0)
Hemoglobin: 12 g/dL — ABNORMAL LOW (ref 13.0–17.0)
MCH: 31.6 pg (ref 26.0–34.0)
MCHC: 33.1 g/dL (ref 30.0–36.0)
MCV: 95.3 fL (ref 78.0–100.0)
Platelets: 137 10*3/uL — ABNORMAL LOW (ref 150–400)
RBC: 3.8 MIL/uL — AB (ref 4.22–5.81)
RDW: 15.5 % (ref 11.5–15.5)
WBC: 3.9 10*3/uL — ABNORMAL LOW (ref 4.0–10.5)

## 2017-10-24 LAB — AEROBIC CULTURE  (SUPERFICIAL SPECIMEN): SPECIAL REQUESTS: NORMAL

## 2017-10-24 LAB — AEROBIC CULTURE W GRAM STAIN (SUPERFICIAL SPECIMEN)

## 2017-10-24 MED ORDER — LORAZEPAM 1 MG PO TABS
1.0000 mg | ORAL_TABLET | Freq: Three times a day (TID) | ORAL | Status: DC
  Administered 2017-10-25 – 2017-10-31 (×19): 1 mg via ORAL
  Filled 2017-10-24 (×20): qty 1

## 2017-10-24 MED ORDER — DIVALPROEX SODIUM ER 500 MG PO TB24
1000.0000 mg | ORAL_TABLET | Freq: Every day | ORAL | Status: DC
  Filled 2017-10-24 (×2): qty 2

## 2017-10-24 NOTE — Progress Notes (Signed)
Family Medicine Teaching Service Daily Progress Note Intern Pager: [phone]  Patient name: Franklin Ayers Medical record number: [id number] Date of birth: [birth year] Age: 57 y.o. Gender: male  Primary Care Provider: Barrie Lyme, FNP Consultants: Psychiatry Code Status: Full  Pt Overview and Major Events to Date:  Franklin Ayers is a 57 y.o. male presenting with altered mental status and a UTI. PMH is significant for schizoaffective disorder.  Assessment and Plan:  Catatonia  Acute psychosis  Schizoaffective disorder Patient was somewhat this morning.  But appears to wake up in the afternoons.  Likely due to heavy sedation from psychiatric medications.  Will space out Ativan to every 8 and move evening doses of Seroquel and Depakote to 8:00. -Ativan 1 mg, every 8 hours-Psychiatry conditions-inpatient management when medically stable -Depakote 500mg  in the AM and 1000mg  at 8 PM PM and Seroquel 300mg  at 8 PM -Will monitor mental status closely  Acute urinary retention secondary to BPH, improved Bladder scans although not post void residual, have improved with less than 200.  There is some incontinence as well but patient overall seems to be urinating well on his own.  Continue current therapy -tamsulosin to 0.8 mg -Strict I's and O's  Soft tissue infection with purulent drainage: C -Wound cultures shows abundant CORYNEBACTERIUM STRIATUM -Continue Bactroban 2% cream to area, daily clean wound with clean room and apply adhesive bandage -Daily CBC -Doxycycline 100 mg twice daily day 4 of 5 -will consult ID for recommendations for abx coverage  UTI with hematuria:-Resolved urine culture showed greater than 100K STAPHYLOCOCCUS LUGDUNENSIS, susceptible to Bactrim.  Completed 7-day course of Bactrim.  Repeat UA was negative for infection or hematuria.   Moderate malnutrition due to chronic illness  -Nutrition consult appreciated -Assist with feeds -Nutritional supplement  -Daily  BMP  Sicca,  Improved.  - cont lubricating eyedrops and warm compresses as needed  Social: Spoke with patient's mother.  She feels that she is unable to cope with taking care of him upon discharge.  She requested placement in an assisted living.  Behavioral health recommended geriatric psych placement.  Patient had barrier to placement psychiatric facility due to Foley placed as this is potential suicide risk from use of Foley catheter.  The patient was able to pass a void trial and Foley was DC'd.  Patient can be placed -Follow-up with social work for geriatric psych unit placement  FEN/GI:  Soft diet due to poor dentition Prophylaxis: Lovenox  Disposition: Medically stable for discharge to inpatient psych  Subjective:  Altered mental status.  Not responding to yes and is today.  Objective: Temp:  [97.7 F (36.5 C)] 97.7 F (36.5 C) (02/12 0506) Pulse Rate:  [76-88] 76 (02/12 0506) Resp:  [16] 16 (02/12 0506) BP: (102)/(58-60) 102/58 (02/12 0506) SpO2:  [97 %-100 %] 100 % (02/12 0506) Physical Exam: General: Somnolent, lying in bed on his side Cardiovascular: RRR, no MRG Respiratory: CTAB, normal work of breathing Abdomen: No CVA tenderness soft nontender nondistended; no purulent discharge today .  Mild erythema around the navel.  Extremities: No edema, 2+ DP Neuro/psych: Altered mental status, not responding to questioning, moves lower extremities spontaneously, bilaterally pupils dilated to 5 mm reactive to light bilaterally   laboratory: Recent Labs  Lab 10/22/17 0846 10/23/17 0401 10/24/17 0924  WBC 6.5 5.4 3.9*  HGB 11.7* 11.2* 12.0*  HCT 34.9* 33.7* 36.2*  PLT PLATELET CLUMPS NOTED ON SMEAR, COUNT APPEARS DECREASED PLATELET CLUMPS NOTED ON SMEAR, COUNT APPEARS DECREASED 137*  Recent Labs  Lab 10/22/17 0343 10/22/17 0846 10/23/17 0401  NA 137 139 139  K 4.1 4.1 4.1  CL 98* 101 101  CO2 28 24 25   BUN 11 10 8   CREATININE 0.66 0.63 0.54*  CALCIUM 9.0 9.2  9.0  PROT  --  6.6  --   BILITOT  --  0.6  --   ALKPHOS  --  64  --   ALT  --  19  --   AST  --  19  --   GLUCOSE 84 78 83   Bonnita Hollow, MD 10/24/2017, 2:40 PM PGY-1, [place] Intern pager: [phone], text pages welcome

## 2017-10-24 NOTE — Progress Notes (Signed)
Nutrition Follow-up  DOCUMENTATION CODES:   Non-severe (moderate) malnutrition in context of chronic illness  INTERVENTION:  1. Continue Ensure Enlive po BID, each supplement provides 350 kcal and 20 grams of protein  2. Continue Magic cup TID with meals, each supplement provides 290 kcal and 9 grams of protein  3. Recommend daily weights  NUTRITION DIAGNOSIS:   Moderate Malnutrition related to chronic illness as evidenced by moderate muscle depletion, mild fat depletion, moderate fat depletion, mild muscle depletion, percent weight loss. -ongoing  GOAL:   Patient will meet greater than or equal to 90% of their needs -progressing  MONITOR:   PO intake, I & O's, Labs, Supplement acceptance, Weight trends  REASON FOR ASSESSMENT:   Consult Assessment of nutrition requirement/status  ASSESSMENT:   57 yo male with PMH schizoaffective disorder, presents with AMS and UTI  Discussed patient's status with NT. Patient must be set-up but is able to feed himself and eats ok. Ate 50% of breakfast potatoes, scrambled eggs, peaches, grits, and orange juice for breakfast. Ate 80% of lunch. Seems to be doing ok, no indication for nutrition support at this time.  More alert, unable to say much today. Medically stable for DC per MD, awaiting geri psych placement.  Labs reviewed Medications reviewed and include:  Ativan  Diet Order:  DIET SOFT Room service appropriate? Yes; Fluid consistency: Thin  EDUCATION NEEDS:   Not appropriate for education at this time  Skin:  Skin Assessment: Reviewed RN Assessment  Last BM:  10/21/2017  Height:   Ht Readings from Last 1 Encounters:  10/20/17 5\' 10"  (1.778 m)    Weight:   Wt Readings from Last 1 Encounters:  10/20/17 127 lb 6.4 oz (57.8 kg)    Ideal Body Weight:  70 kg  BMI:  Body mass index is 18.28 kg/m.  Estimated Nutritional Needs:   Kcal:  3094-0768 calories  Protein:  89-102 grams  Fluid:  1.6-1.9L  Satira Anis.  Monica Zahler, MS, RD LDN Inpatient Clinical Dietitian Pager [phone]

## 2017-10-24 NOTE — Progress Notes (Signed)
Pt spat out all PO meds; Seroquel, Ativan, Doxycycline, Depakote. Resident MD on call notified. Will continue to monitor.

## 2017-10-24 NOTE — Progress Notes (Signed)
Pt refused blood work this morning. Lab instructed to return around 10 am and attempt again. MD, notified

## 2017-10-24 NOTE — Progress Notes (Signed)
[place] requested CT abdomen results. CSW faxed.  Percell Locus Lydian Chavous LCSW [phone]

## 2017-10-25 DIAGNOSIS — N401 Enlarged prostate with lower urinary tract symptoms: Secondary | ICD-10-CM

## 2017-10-25 MED ORDER — ALUM HYDROXIDE-MAG CARBONATE 95-358 MG/15ML PO SUSP
30.0000 mL | ORAL | Status: DC | PRN
  Filled 2017-10-25: qty 30

## 2017-10-25 MED ORDER — DIVALPROEX SODIUM ER 500 MG PO TB24
1000.0000 mg | ORAL_TABLET | Freq: Every day | ORAL | Status: DC
  Administered 2017-10-25: 1000 mg via ORAL
  Filled 2017-10-25: qty 2

## 2017-10-25 NOTE — Progress Notes (Signed)
Family Medicine Teaching Service Daily Progress Note Intern Pager: [phone]  Patient name: Franklin Ayers Medical record number: [id number] Date of birth: December 22, 1960 Age: 57 y.o. Gender: male  Primary Care Provider: Barrie Lyme, FNP Consultants: Psychiatry Code Status: Full  Pt Overview and Major Events to Date:  Franklin Ayers is a 57 y.o. male presenting with altered mental status and a UTI. PMH is significant for schizoaffective disorder.  Assessment and Plan:  Catatonia  Acute psychosis  Schizoaffective disorder Medically stable for discharge.  Patient spit out evening psychiatric medications.  However, he is more alert this morning.  Sitting up in bed with glasses.   -Ativan 1 mg, every 8 hours -Psychiatry conditions-inpatient management when medically stable -Depakote 500mg  in the AM and 1000mg  at 8 PM PM and Seroquel 300mg  at 8 PM -Will monitor mental status closely   Indigestion Patient spoke that he had some abdominal pain after eating.  Will trial antacids   Acute urinary retention secondary to BPH, improved Patient is voiding on his own without retention.   -tamsulosin to 0.8 mg -Strict I's and O's  Soft tissue infection with purulent drainage:  -Wound cultures shows abundant CORYNEBACTERIUM STRIATUM.  Spoke with ID and they said it was appropriately treated with doxycycline. -Continue Bactroban 2% cream to area, daily clean wound with clean room and apply adhesive bandage -Daily CBC -Doxycycline 100 mg twice daily day 5 of 5  UTI with hematuria:-Resolved urine culture showed greater than 100K STAPHYLOCOCCUS LUGDUNENSIS, susceptible to Bactrim.  Completed 7-day course of Bactrim.  Repeat UA was negative for infection or hematuria.   Moderate malnutrition due to chronic illness  -Nutrition consult appreciated -Assist with feeds -Nutritional supplement  -Daily BMP  Sicca,  Improved.  - cont lubricating eyedrops and warm compresses as needed  Social:  Spoke with patient's mother.  She feels that she is unable to cope with taking care of him upon discharge.  She requested placement in an assisted living.  Behavioral health recommended geriatric psych placement.  Patient had barrier to placement psychiatric facility due to Foley placed as this is potential suicide risk from use of Foley catheter.  The patient was able to pass a void trial and Foley was DC'd.  Patient can be placed -Follow-up with social work for geriatric psych unit placement  FEN/GI:  Soft diet due to poor dentition Prophylaxis: Lovenox  Disposition: Medically stable for discharge to inpatient psych  Subjective:  Alert this morning.  Sitting up with glasses.  Endorses some mild abdominal pain after eating.  Also complains of eyes being too cold.  No other complaints. Objective: Temp:  [98 F (36.7 C)-98.1 F (36.7 C)] 98.1 F (36.7 C) (02/12 2158) Pulse Rate:  [75-79] 79 (02/12 2158) Resp:  [16] 16 (02/12 2158) BP: (103)/(58-66) 103/66 (02/12 2158) SpO2:  [98 %] 98 % (02/12 1458) Weight:  [126 lb 8 oz (57.4 kg)] 126 lb 8 oz (57.4 kg) (02/13 0711) Physical Exam: General: Upright sitting in bed with glasses, no acute distress Cardiovascular: RRR, no MRG Respiratory: CTAB, normal work of breathing Abdomen: soft nontender nondistended; no purulent discharge today.  No erythema around able.   Extremities: No edema, 2+ DP Neuro/psych: Alert and oriented x3.  Moves extremities spontaneously  laboratory: Recent Labs  Lab 10/22/17 0846 10/23/17 0401 10/24/17 0924  WBC 6.5 5.4 3.9*  HGB 11.7* 11.2* 12.0*  HCT 34.9* 33.7* 36.2*  PLT PLATELET CLUMPS NOTED ON SMEAR, COUNT APPEARS DECREASED PLATELET CLUMPS NOTED ON SMEAR, COUNT APPEARS  DECREASED 137*   Recent Labs  Lab 10/22/17 0343 10/22/17 0846 10/23/17 0401  NA 137 139 139  K 4.1 4.1 4.1  CL 98* 101 101  CO2 28 24 25   BUN 11 10 8   CREATININE 0.66 0.63 0.54*  CALCIUM 9.0 9.2 9.0  PROT  --  6.6  --   BILITOT   --  0.6  --   ALKPHOS  --  64  --   ALT  --  19  --   AST  --  19  --   GLUCOSE 84 78 83   Bonnita Hollow, MD 10/25/2017, 9:34 AM PGY-1, [place] Intern pager: [phone], text pages welcome

## 2017-10-25 NOTE — Progress Notes (Signed)
Physical Therapy Treatment Patient Details Name: Franklin Ayers MRN: [id number] DOB: February 20, 1961 Today's Date: 10/25/2017    History of Present Illness Franklin Ayers a 57 y.o.malepresenting with altered mental status and a UTI. PMH is significant forschizoaffective disorder. Unsure of compliance with medications.    PT Comments    Pt much improved from Monday. Pt more alert and now verbal and having a conversation. Pt remains to have delayed response time and difficulty sequencing but did participate in PT. Pt remains to require assist for all mobility and is at high falls risk. Con't to recommend SNF to achieve safe supervision level of function to return home with mother.   Follow Up Recommendations  SNF;Supervision/Assistance - 24 hour     Equipment Recommendations       Recommendations for Other Services       Precautions / Restrictions Precautions Precautions: Fall Restrictions Weight Bearing Restrictions: No    Mobility  Bed Mobility Overal bed mobility: Needs Assistance Bed Mobility: Supine to Sit     Supine to sit: Mod assist     General bed mobility comments: pt initiated LEs mvmt to EOB and reaching with R UE for PT however modA to bring LEs off EOB and trunk elevation. once seated pt supervision for EOB balance  Transfers Overall transfer level: Needs assistance Equipment used: 1 person hand held assist Transfers: Sit to/from Stand Sit to Stand: Min assist;Mod assist         General transfer comment: tactile cues to initiate forward movement, pt with fear midly resisting, once up pt with narrow base of support and shaky  Ambulation/Gait Ambulation/Gait assistance: Mod assist;+2 physical assistance Ambulation Distance (Feet): 150 Feet Assistive device: 2 person hand held assist Gait Pattern/deviations: Step-through pattern;Decreased stride length;Narrow base of support;Staggering left Gait velocity: slow Gait velocity interpretation: Below  normal speed for age/gender General Gait Details: pt able to maintain midline but con't to walk on his heals. v/c's to look forward not down and to try to complete heal/toe ambulation pattern.    Stairs            Wheelchair Mobility    Modified Rankin (Stroke Patients Only)       Balance Overall balance assessment: Needs assistance Sitting-balance support: Bilateral upper extremity supported Sitting balance-Leahy Scale: Fair Sitting balance - Comments: pt unable to maintain balance when trying to don socks on   Standing balance support: Bilateral upper extremity supported Standing balance-Leahy Scale: Poor Standing balance comment: dependent on external support                            Cognition Arousal/Alertness: Awake/alert Behavior During Therapy: Flat affect Overall Cognitive Status: History of cognitive impairments - at baseline                                 General Comments: h/o schizophrenia. Pt more verbal today and able to carry on conversation but does have delayed response time and requires tactile cues to sequence task asked      Exercises      General Comments        Pertinent Vitals/Pain Pain Assessment: No/denies pain    Home Living                      Prior Function            PT Goals (  current goals can now be found in the care plan section) Progress towards PT goals: Progressing toward goals    Frequency    Min 2X/week      PT Plan Current plan remains appropriate    Co-evaluation              AM-PAC PT "6 Clicks" Daily Activity  Outcome Measure  Difficulty turning over in bed (including adjusting bedclothes, sheets and blankets)?: Unable Difficulty moving from lying on back to sitting on the side of the bed? : Unable Difficulty sitting down on and standing up from a chair with arms (e.g., wheelchair, bedside commode, etc,.)?: Unable Help needed moving to and from a bed to chair  (including a wheelchair)?: A Lot Help needed walking in hospital room?: A Lot Help needed climbing 3-5 steps with a railing? : A Lot 6 Click Score: 9    End of Session Equipment Utilized During Treatment: Gait belt Activity Tolerance: Patient tolerated treatment well Patient left: in chair;with call bell/phone within reach;with chair alarm set Nurse Communication: Mobility status PT Visit Diagnosis: Unsteadiness on feet (R26.81);History of falling (Z91.81)     Time: 0937-1000 PT Time Calculation (min) (ACUTE ONLY): 23 min  Charges:  $Gait Training: 8-22 mins $Therapeutic Activity: 8-22 mins                    G Codes:       Kittie Plater, PT, DPT Pager #: [phone] Office #: [phone]    [place] 10/25/2017, 10:52 AM

## 2017-10-25 NOTE — Care Management Note (Signed)
Case Management Note  Patient Details  Name: Franklin Ayers MRN: [id number] Date of Birth: 29-Jun-1961  Subjective/Objective:   Catatonia, Acute Psychosis, Urinary Retention                Action/Plan: Patient to be placed in [facility] at [facility]; awaiting on bed; [facility] SW for placement; CM will continue to follow for progression of care.  Expected Discharge Date:   possibly 10/27/2017               Expected Discharge Plan:  [facility]  In-House Referral:  Clinical Social Work  Discharge planning Services  CM Consult  Status of Service:  Completed, signed off  Sherrilyn Rist [phone] 10/25/2017, 4:14 PM

## 2017-10-26 MED ORDER — DIVALPROEX SODIUM ER 500 MG PO TB24
1000.0000 mg | ORAL_TABLET | Freq: Every day | ORAL | Status: AC
  Administered 2017-10-26 – 2017-11-05 (×11): 1000 mg via ORAL
  Filled 2017-10-26 (×11): qty 2

## 2017-10-26 MED ORDER — DIVALPROEX SODIUM ER 500 MG PO TB24: 1000.0000 mg | ORAL_TABLET | Freq: Every day | ORAL | Status: DC

## 2017-10-26 MED ORDER — QUETIAPINE FUMARATE 25 MG PO TABS
200.0000 mg | ORAL_TABLET | Freq: Every day | ORAL | Status: DC
Start: 2017-10-26 — End: 2017-10-26

## 2017-10-26 MED ORDER — QUETIAPINE FUMARATE 25 MG PO TABS
200.0000 mg | ORAL_TABLET | Freq: Every day | ORAL | Status: DC
Start: 2017-10-26 — End: 2017-11-05
  Administered 2017-10-26 – 2017-11-04 (×10): 200 mg via ORAL
  Filled 2017-10-26 (×11): qty 8

## 2017-10-26 NOTE — Progress Notes (Signed)
Family Medicine Teaching Service Daily Progress Note Intern Pager: [phone]  Patient name: Franklin Ayers Medical record number: [id number] Date of birth: 17-Aug-1961 Age: 57 y.o. Gender: male  Primary Care Provider: Barrie Lyme, FNP Consultants: Psychiatry Code Status: Full  Pt Overview and Major Events to Date:  Franklin Ayers is a 57 y.o. male presenting with altered mental status and a UTI. PMH is significant for schizoaffective disorder.  Assessment and Plan:  Catatonia  Acute psychosis  Schizoaffective disorder Patient was awake and alert this morning.  Although more groggy than normal.  Medically stable for discharge.   -Ativan 1 mg, every 8 hours -Psychiatry conditions-inpatient management when medically stable -Depakote 500mg  in the AM and 1000mg  at 8 PM PM and Seroquel 300mg  at 8 PM -Will monitor mental status closely   Indigestion, improved -Gaviscon as needed  Acute urinary retention secondary to BPH, improved Patient is voiding on his own without retention.   -tamsulosin to 0.8 mg -Strict I's and O's  Soft tissue infection with purulent drainage:  -Wound cultures shows abundant CORYNEBACTERIUM STRIATUM.  Spoke with ID and they said it was appropriately treated with doxycycline. -Continue Bactroban 2% cream to area, daily clean wound with clean room and apply adhesive bandage -Completed doxycycline 100 mg twice daily day 5 of 5  UTI with hematuria:-Resolved urine culture showed greater than 100K STAPHYLOCOCCUS LUGDUNENSIS, susceptible to Bactrim.  Completed 7-day course of Bactrim.  Repeat UA was negative for infection or hematuria.   Moderate malnutrition due to chronic illness  -Nutrition consult appreciated -Assist with feeds -Nutritional supplement    Sicca,  Improved.  - cont lubricating eyedrops and warm compresses as needed  Social: Spoke with patient's mother.  She feels that she is unable to cope with taking care of him upon discharge.  She  requested placement in an assisted living.   -Follow-up with social work for geriatric psych unit placement  FEN/GI:  Soft diet due to poor dentition Prophylaxis: Lovenox  Disposition: Medically stable for discharge, awaiting placement to inpatient psych  Subjective:  Alert this morning, although feels more sleepy.  No complaints   objective: Temp:  [97.8 F (36.6 C)-99.3 F (37.4 C)] 97.8 F (36.6 C) (02/14 0500) Pulse Rate:  [78-101] 80 (02/14 0500) Resp:  [16] 16 (02/14 0500) BP: (86-118)/(58-61) 86/58 (02/14 0500) SpO2:  [96 %-100 %] 98 % (02/14 0500) Weight:  [128 lb 8.5 oz (58.3 kg)] 128 lb 8.5 oz (58.3 kg) (02/14 0500) Physical Exam: General: Lying in bed no acute distress Cardiovascular: RRR, no MRG Respiratory: CTAB, normal work of breathing Abdomen: soft nontender nondistended; no purulent discharge today.  No erythema around able.   Extremities: No edema, 2+ DP Neuro/psych: Alert and oriented x3.  Moves extremities spontaneously  Bonnita Hollow, MD 10/26/2017, 9:18 AM PGY-1, [place] Intern pager: [phone], text pages welcome

## 2017-10-27 LAB — CBC
HEMATOCRIT: 32.1 % — AB (ref 39.0–52.0)
Hemoglobin: 10.4 g/dL — ABNORMAL LOW (ref 13.0–17.0)
MCH: 30.8 pg (ref 26.0–34.0)
MCHC: 32.4 g/dL (ref 30.0–36.0)
MCV: 95 fL (ref 78.0–100.0)
Platelets: 170 10*3/uL (ref 150–400)
RBC: 3.38 MIL/uL — ABNORMAL LOW (ref 4.22–5.81)
RDW: 15.9 % — ABNORMAL HIGH (ref 11.5–15.5)
WBC: 4.8 10*3/uL (ref 4.0–10.5)

## 2017-10-27 NOTE — Progress Notes (Signed)
[place] requesting vitals and recent CBC. MD aware.  Percell Locus Liahm Grivas LCSW [phone]

## 2017-10-27 NOTE — Progress Notes (Signed)
Family Medicine Teaching Service Daily Progress Note Intern Pager: [phone]  Patient name: Franklin Ayers Medical record number: [id number] Date of birth: [birth year] Age: 57 y.o. Gender: male  Primary Care Provider: Barrie Lyme, FNP Consultants: Psychiatry Code Status: Full  Pt Overview and Major Events to Date:  Franklin Ayers is a 57 y.o. male presenting with altered mental status and a UTI. PMH is significant for schizoaffective disorder.  Assessment and Plan:  Catatonia  Acute psychosis  Schizoaffective disorder Patient was awake and alert this morning.  Although more groggy than normal.  Medically stable for discharge.   -Ativan 1 mg, every 8 hours -Psychiatry conditions-inpatient management when medically stable -Depakote 500mg  in the AM and 1000mg  at 8 PM PM and Seroquel 300mg  at 8 PM -Will monitor mental status closely   Indigestion, improved -Gaviscon as needed  Acute urinary retention secondary to BPH, resolved Patient is voiding on his own without retention.   -tamsulosin to 0.8 mg -Strict I's and O's  Soft tissue infection with purulent drainage, improved -Wound cultures shows abundant CORYNEBACTERIUM STRIATUM.  Spoke with ID and they said it was appropriately treated with doxycycline. -Continue Bactroban 2% cream to area, daily clean wound with clean room and apply adhesive bandage -Completed doxycycline 100 mg twice daily day 5 of 5  UTI with hematuria:-Resolved urine culture showed greater than 100K STAPHYLOCOCCUS LUGDUNENSIS, susceptible to Bactrim.  Completed 7-day course of Bactrim.  Repeat UA was negative for infection or hematuria.   Moderate malnutrition due to chronic illness  -Nutrition consult appreciated -Assist with feeds -Nutritional supplement    Sicca,  Improved.  - cont lubricating eyedrops and warm compresses as needed  Social: Spoke with patient's mother.  She feels that she is unable to cope with taking care of him upon  discharge.  She requested placement in an assisted living.   -Follow-up with social work for geriatric psych unit placement  FEN/GI:  Soft diet due to poor dentition Prophylaxis: Lovenox  Disposition: Medically stable for discharge, awaiting placement to inpatient psych  Subjective:  Alert this morning, although feels more sleepy.  No complaints   objective: Temp:  [97.6 F (36.4 C)-98.3 F (36.8 C)] 97.6 F (36.4 C) (02/15 0524) Pulse Rate:  [66-77] 66 (02/15 0524) Resp:  [16] 16 (02/15 0524) BP: (95-96)/(55-57) 95/55 (02/15 0524) SpO2:  [97 %-98 %] 97 % (02/15 0524) Weight:  [129 lb 6.6 oz (58.7 kg)] 129 lb 6.6 oz (58.7 kg) (02/15 0524) Physical Exam: General: Lying in bed no acute distress Cardiovascular: RRR, no MRG Respiratory: CTAB, normal work of breathing Abdomen: soft nontender nondistended; no purulent discharge today.  No erythema around able.   Extremities: No edema, 2+ DP Neuro/psych: Alert and oriented x3.  Moves extremities spontaneously  Bonnita Hollow, MD 10/27/2017, 7:33 AM PGY-1, [place] Intern pager: [phone], text pages welcome

## 2017-10-27 NOTE — Progress Notes (Signed)
Family Medicine Teaching Service Daily Progress Note Intern Pager: [phone]  Patient name: Franklin Ayers Medical record number: [id number] Date of birth: [birth year] Age: 57 y.o. Gender: male  Primary Care Provider: Barrie Lyme, FNP Consultants: Psychiatry Code Status: Full  Assessment and Plan: Franklin Ayers is a 57 y.o. male presenting with altered mental status and a UTI. PMH is significant for schizoaffective disorder.  Catatonia  Acute psychosis  Schizoaffective disorder Improved, though still in need of inpatient psychiatric care. Medically stable for discharge. .   -Ativan 1 mg q8hr -Psychiatry recs -inpatient management now that patient is medically stable -Depakote 500mg  in the AM and 1000mg  at 8 PM  -Seroquel 300mg  at 8 PM -Will monitor mental status closely  -BMP q3d to maintain inpatient psych necessity  Acid reflux, improved -Gaviscon PRN  Acute urinary retention secondary to BPH, resolved Patient is voiding normally without retention.   -Tamsulosin 0.8 mg qd -Strict I/Os - will discuss need for continued I/Os with SW   Soft tissue infection with purulent drainage, improved: Wound cultures shows abundant Corynebacterium that has been appropriately treated with doxy 100mg  BID x5d per ID.  -Continue Bactroban 2% cream to area, daily clean wound with clean room and apply adhesive bandage  UTI with hematuria, resolved: Resolved urine culture showed greater than 100K Staph lugdunensis, susceptible to Bactrim. Completed 7-day course of Bactrim. Repeat UA was negative for infection or hematuria.   Moderate malnutrition due to chronic illness  -Nutrition consult appreciated -Assist with feeds -Nutritional supplement   Sicca:  Improved.  -Cont lubricating eyedrops and warm compresses as needed  Social: Mother concerned about her ability to take of patient after discharge and feels that assisted living facility will be best for patient.  -Awaiting geri unit  psych placement  FEN/GI:  Soft diet due to poor dentition Prophylaxis: Lovenox  Disposition: Medically stable for discharge, awaiting placement to inpatient psych  Subjective:  No acute events overnight. Patient pretending to be asleep this AM but does not voice any complaints when asked.   objective: Temp:  [97.8 F (36.6 C)-97.9 F (36.6 C)] 97.9 F (36.6 C) (02/15 2104) Pulse Rate:  [72-73] 72 (02/15 2104) Resp:  [16-17] 17 (02/15 2104) BP: (93-97)/(57-61) 97/61 (02/15 2104) SpO2:  [99 %-100 %] 99 % (02/15 2104) Weight:  [133 lb 6.1 oz (60.5 kg)] 133 lb 6.1 oz (60.5 kg) (02/16 0329) Physical Exam: General: Lying in bed in NAD; appears to be pretending to be asleep Cardiovascular: RRR, no MRG Respiratory: CTAB, normal work of breathing Abdomen: soft nontender nondistended; no purulent discharge today    Extremities: No edema, 2+ DP Neuro/psych: Moving extremities spontaneously throughout encounter  Verner Mould, MD 10/28/2017, 6:55 AM PGY-1, [place] Intern pager: [phone], text pages welcome

## 2017-10-27 NOTE — Progress Notes (Signed)
Physical Therapy Treatment Patient Details Name: Franklin Ayers MRN: [id number] DOB: [birth year] Today's Date: 10/27/2017    History of Present Illness Franklin Ayers a 57 y.o.malepresenting with altered mental status and a UTI. PMH is significant forschizoaffective disorder. Unsure of compliance with medications.    PT Comments    Patient received in bed, pleasant and appearing willing to work with skilled PT services. His mobility appears significantly improved today, and he is able to perform functional bed mobility, transfers, and gait with min guard x2 today. He continues to demonstrate gait deviations as listed below, unable to correct or improve mechanics with external cues from PT today, although at end of session patient states "I was still walking on my heels". Due to improved functional mobility he seems safe to reduce to +1 assistance with PT moving forward. Patient left up in chair with alarm set, all needs otherwise met.     Follow Up Recommendations  SNF;Supervision/Assistance - 24 hour     Equipment Recommendations  (defer to next venue)    Recommendations for Other Services       Precautions / Restrictions Precautions Precautions: Fall Precaution Comments: non-verbal Restrictions Weight Bearing Restrictions: No    Mobility  Bed Mobility Overal bed mobility: Needs Assistance Bed Mobility: Supine to Sit     Supine to sit: Min guard     General bed mobility comments: increased time, verbal cues for sequencing and safety   Transfers Overall transfer level: Needs assistance Equipment used: 2 person hand held assist Transfers: Sit to/from Stand Sit to Stand: Min guard         General transfer comment: mobility much improved today, able to stand with light min guard of 2+, able to stabilize quickly when standing   Ambulation/Gait Ambulation/Gait assistance: Min guard;+2 physical assistance Ambulation Distance (Feet): 200 Feet Assistive device:  2 person hand held assist Gait Pattern/deviations: Step-through pattern;Decreased stride length;Narrow base of support;Staggering left;Drifts right/left Gait velocity: slow   General Gait Details: able to maintain midline, tends to mildly drift L/R and continues to walk on heels/look down despite cues from PT   Stairs            Wheelchair Mobility    Modified Rankin (Stroke Patients Only)       Balance Overall balance assessment: Needs assistance Sitting-balance support: Bilateral upper extremity supported;Feet supported Sitting balance-Leahy Scale: Fair       Standing balance-Leahy Scale: Fair Standing balance comment: dynamic balance is improving                             Cognition Arousal/Alertness: Awake/alert Behavior During Therapy: Flat affect Overall Cognitive Status: History of cognitive impairments - at baseline                                 General Comments: h/o schizophrenia. Pt more verbal today and able to carry on conversation but does have delayed response time and requires tactile cues to sequence task asked      Exercises      General Comments        Pertinent Vitals/Pain Pain Assessment: No/denies pain    Home Living                      Prior Function            PT Goals (current goals can  now be found in the care plan section) Acute Rehab PT Goals Patient Stated Goal: didn't state PT Goal Formulation: Patient unable to participate in goal setting Time For Goal Achievement: 11/06/17 Potential to Achieve Goals: Fair Progress towards PT goals: Progressing toward goals    Frequency    Min 2X/week      PT Plan Current plan remains appropriate    Co-evaluation              AM-PAC PT "6 Clicks" Daily Activity  Outcome Measure  Difficulty turning over in bed (including adjusting bedclothes, sheets and blankets)?: None Difficulty moving from lying on back to sitting on the side of  the bed? : None Difficulty sitting down on and standing up from a chair with arms (e.g., wheelchair, bedside commode, etc,.)?: None Help needed moving to and from a bed to chair (including a wheelchair)?: A Little Help needed walking in hospital room?: A Little Help needed climbing 3-5 steps with a railing? : A Lot 6 Click Score: 20    End of Session Equipment Utilized During Treatment: Gait belt Activity Tolerance: Patient tolerated treatment well Patient left: in chair;with chair alarm set;with call bell/phone within reach   PT Visit Diagnosis: Unsteadiness on feet (R26.81);History of falling (Z91.81)     Time: 3582-5189 PT Time Calculation (min) (ACUTE ONLY): 15 min  Charges:  $Gait Training: 8-22 mins                    G Codes:       Deniece Ree PT, DPT, CBIS  Supplemental Physical Therapist [place]   Pager [phone]

## 2017-10-28 LAB — BASIC METABOLIC PANEL
Anion gap: 12 (ref 5–15)
BUN: 8 mg/dL (ref 6–20)
CO2: 29 mmol/L (ref 22–32)
CREATININE: 0.61 mg/dL (ref 0.61–1.24)
Calcium: 9.3 mg/dL (ref 8.9–10.3)
Chloride: 100 mmol/L — ABNORMAL LOW (ref 101–111)
GFR calc non Af Amer: >60 mL/min (ref >60)
Glucose, Bld: 82 mg/dL (ref 65–99)
POTASSIUM: 4 mmol/L (ref 3.5–5.1)
SODIUM: 141 mmol/L (ref 135–145)

## 2017-10-28 LAB — CBC
HEMATOCRIT: 32.2 % — AB (ref 39.0–52.0)
Hemoglobin: 10.2 g/dL — ABNORMAL LOW (ref 13.0–17.0)
MCH: 30 pg (ref 26.0–34.0)
MCHC: 31.7 g/dL (ref 30.0–36.0)
MCV: 94.7 fL (ref 78.0–100.0)
Platelets: 183 10*3/uL (ref 150–400)
RBC: 3.4 MIL/uL — AB (ref 4.22–5.81)
RDW: 15.8 % — ABNORMAL HIGH (ref 11.5–15.5)
WBC: 5.5 10*3/uL (ref 4.0–10.5)

## 2017-10-29 MED ORDER — DOCUSATE SODIUM 100 MG PO CAPS
200.0000 mg | ORAL_CAPSULE | Freq: Every day | ORAL | Status: DC
  Administered 2017-10-29 – 2017-11-06 (×9): 200 mg via ORAL
  Filled 2017-10-29 (×9): qty 2

## 2017-10-29 MED ORDER — POLYETHYLENE GLYCOL 3350 17 G PO PACK
17.0000 g | PACK | Freq: Every day | ORAL | Status: DC
  Administered 2017-10-29 – 2017-11-06 (×9): 17 g via ORAL
  Filled 2017-10-29 (×9): qty 1

## 2017-10-29 NOTE — Progress Notes (Signed)
Family Medicine Teaching Service Daily Progress Note Intern Pager: [phone]  Patient name: Franklin Ayers Medical record number: [id number] Date of birth: [birth year] Age: 57 y.o. Gender: male  Primary Care Provider: Barrie Lyme, FNP Consultants: Psychiatry Code Status: Full  Assessment and Plan: Franklin Ayers is a 57 y.o. male presenting with altered mental status and a UTI. PMH is significant for schizoaffective disorder.  Catatonia  Acute psychosis  Schizoaffective disorder Improved, though still in need of inpatient psychiatric care. Medically stable for discharge.  -Ativan 1 mg q8hr -Psychiatry recs -inpatient management now that patient is medically stable -Depakote 500mg  in the AM and 1000mg  at 8 PM  -Seroquel 300mg  at 8 PM -Will monitor mental status closely  -BMP q3d to maintain inpatient psych necessity  Acid reflux, improved -Gaviscon PRN  Acute urinary retention secondary to BPH, resolved Patient is voiding normally without retention.   -Tamsulosin 0.8 mg qd -Strict I/Os - will discuss need for continued I/Os with SW   Soft tissue infection with purulent drainage, improved: Wound cultures shows abundant Corynebacterium that has been appropriately treated with doxy 100mg  BID x5d per ID.  -Continue Bactroban 2% cream to area, daily clean wound with clean room and apply adhesive bandage  UTI with hematuria, resolved: Resolved urine culture showed greater than 100K Staph lugdunensis, susceptible to Bactrim. Completed 7-day course of Bactrim. Repeat UA was negative for infection or hematuria.   Moderate malnutrition due to chronic illness  -Nutrition consult appreciated -Assist with feeds -Nutritional supplement   Sicca:  Improved.  -Cont lubricating eyedrops and warm compresses as needed  Social: Mother concerned about her ability to take of patient after discharge and feels that assisted living facility will be best for patient.  -Awaiting geri unit psych  placement  FEN/GI:  Soft diet due to poor dentition Prophylaxis: Lovenox  Disposition: Medically stable for discharge, awaiting placement to inpatient psych  Subjective:  Patient reports he is trying to "watch TV and stay still". Only thing that is bothering him is constipation. Last BM 2-3 days ago per patient.   objective: Temp:  [97.8 F (36.6 C)-98.3 F (36.8 C)] 97.8 F (36.6 C) (02/16 2108) Pulse Rate:  [79-81] 81 (02/17 0453) Resp:  [16-18] 16 (02/17 0453) BP: (78-98)/(41-60) 88/43 (02/17 0453) SpO2:  [96 %-99 %] 99 % (02/17 0453) Weight:  [132 lb 11.5 oz (60.2 kg)] 132 lb 11.5 oz (60.2 kg) (02/17 0453) Physical Exam: General: Sitting up in chair, awake and alert  Cardiovascular: RRR, no MRG Respiratory: CTAB, normal work of breathing Abdomen: soft nontender nondistended; no purulent discharge today    Extremities: No edema, 2+ DP Neuro/psych: Moving extremities spontaneously throughout encounter  Nicolette Bang, DO 10/29/2017, 7:53 AM PGY-3, [place] Intern pager: [phone], text pages welcome

## 2017-10-30 NOTE — Progress Notes (Signed)
Family Medicine Teaching Service Daily Progress Note Intern Pager: [phone]  Patient name: Harshal Sirmon Medical record number: [id number] Date of birth: Sep 06, 1961 Age: 57 y.o. Gender: male  Primary Care Provider: Barrie Lyme, FNP Consultants: Psychiatry Code Status: Full  Assessment and Plan: Lamon Rotundo is a 57 y.o. male presenting with altered mental status and a UTI. PMH is significant for schizoaffective disorder.  Catatonia  Acute psychosis  Schizoaffective disorder Improved, though still in need of inpatient psychiatric care. Medically stable for discharge.  -Ativan 1 mg q8hr -Psychiatry recs -inpatient management now that patient is medically stable -Depakote 500mg  in the AM and 1000mg  at 8 PM  -Seroquel 300mg  at 8 PM -Will monitor mental status closely  -BMP q3d to maintain inpatient psych necessity  Acid reflux, improved -Gaviscon PRN  Acute urinary retention secondary to BPH, resolved Patient is voiding normally without retention.   -Tamsulosin 0.8 mg qd -Strict I/Os - will discuss need for continued I/Os with SW   Soft tissue infection with purulent drainage, improved: Wound cultures shows abundant Corynebacterium that has been appropriately treated with doxy 100mg  BID x5d per ID.  -Continue Bactroban 2% cream to area, daily clean wound with clean room and apply adhesive bandage  UTI with hematuria, resolved: Resolved urine culture showed greater than 100K Staph lugdunensis, susceptible to Bactrim. Completed 7-day course of Bactrim. Repeat UA was negative for infection or hematuria.   Moderate malnutrition due to chronic illness  -Nutrition consult appreciated -Assist with feeds -Nutritional supplement   Sicca:  Improved.  -Cont lubricating eyedrops and warm compresses as needed  Social: Mother concerned about her ability to take of patient after discharge and feels that assisted living facility will be best for patient.  -Awaiting geri unit psych  placement  FEN/GI:  Soft diet due to poor dentition Prophylaxis: Lovenox  Disposition: Medically stable for discharge, awaiting placement to inpatient psych  Subjective:  Patient reports he is trying to "watch TV and stay still". Only thing that is bothering him is constipation. Last BM 2-3 days ago per patient.   objective: Temp:  [97.7 F (36.5 C)-98.9 F (37.2 C)] 98.2 F (36.8 C) (02/18 0648) Pulse Rate:  [78-85] 78 (02/18 0648) Resp:  [16-18] 18 (02/17 2140) BP: (88-108)/(44-65) 88/44 (02/18 0648) SpO2:  [95 %-99 %] 95 % (02/18 0648) Weight:  [134 lb 0.6 oz (60.8 kg)] 134 lb 0.6 oz (60.8 kg) (02/18 9563) Physical Exam: General: Sitting up in chair, awake and alert  Cardiovascular: RRR, no MRG Respiratory: CTAB, normal work of breathing Abdomen: soft nontender nondistended; no purulent discharge today    Extremities: No edema, 2+ DP Neuro/psych: Moving extremities spontaneously throughout encounter  Bonnita Hollow, MD 10/30/2017, 12:04 PM PGY-3, [place] Intern pager: [phone], text pages welcome

## 2017-10-30 NOTE — Progress Notes (Signed)
[facility] contacted CSW to make sure we still have patient.   Percell Locus Sherial Ebrahim LCSW [phone]

## 2017-10-31 MED ORDER — LORAZEPAM 1 MG PO TABS
1.0000 mg | ORAL_TABLET | Freq: Three times a day (TID) | ORAL | Status: DC
  Administered 2017-10-31 – 2017-11-04 (×12): 1 mg via ORAL
  Filled 2017-10-31 (×12): qty 1

## 2017-10-31 MED ORDER — LORAZEPAM 0.5 MG PO TABS: 0.5000 mg | ORAL_TABLET | Freq: Three times a day (TID) | ORAL | Status: DC

## 2017-10-31 NOTE — Progress Notes (Signed)
Family Medicine Teaching Service Daily Progress Note Intern Pager: [phone]  Patient name: Franklin Ayers Medical record number: [id number] Date of birth: [birth year] Age: 57 y.o. Gender: male  Primary Care Provider: Barrie Lyme, FNP Consultants: Psychiatry Code Status: Full  Assessment and Plan: Franklin Ayers is a 57 y.o. male presenting with altered mental status and a UTI. PMH is significant for schizoaffective disorder.  Catatonia  Acute psychosis  Schizoaffective disorder Improved, though still in need of inpatient psychiatric care. Medically stable for discharge.  Patient has had some asymptomatic hypotension episodes 90s over 50s.  There is usually resolve when the patient becomes more active throughout the day.  Likely due to oversedation from psychiatric medications.  However, mental status improved on current psychiatric regimen. Continue to monitor BP. Otherwise stable and medically stable ready for discharge. -Ativan 1 mg q8hr -Psychiatry recs -inpatient management now that patient is medically stable -Depakote 500mg  in the AM and 1000mg  at 8 PM  -Seroquel 300mg  at 8 PM -Will monitor mental status closely  -BMP q3d to maintain inpatient psych necessity  -Tylenol PRN  Left arm pain Patient complaining of left arm pain at IV site.  IV does not look erythematous, not tender to palpation.  He has been afebrile. -Monitor IV site -Monitor fever curve -Monitor CBC -Tylenol PRN  Acute urinary retention secondary to BPH, resolved Patient is voiding normally without retention.   -Tamsulosin 0.8 mg qd -Strict I/Os - will discuss need for continued I/Os with SW   Soft tissue infection with purulent drainage, improved: Wound cultures shows abundant Corynebacterium that has been appropriately treated with doxy 100mg  BID x5d per ID.  -Continue Bactroban 2% cream to area, daily clean wound with clean room and apply adhesive bandage  Moderate malnutrition due to chronic  illness  -Nutrition consult appreciated -Assist with feeds -Nutritional supplement   Social: Mother concerned about her ability to take of patient after discharge and feels that assisted living facility will be best for patient.  -Awaiting geri unit psych placement  FEN/GI:  Soft diet due to poor dentition Prophylaxis: Lovenox  Disposition: Medically stable for discharge, awaiting placement to inpatient psych  Subjective:  Patient says that he is bothered by left IV and arm.  He also is tired of being stuck with needles in his stomach from his Lovenox dosage.  Otherwise no other complaints.  objective: Temp:  [98.2 F (36.8 C)-98.7 F (37.1 C)] 98.7 F (37.1 C) (02/18 2148) Pulse Rate:  [76-89] 89 (02/18 2148) Resp:  [17] 17 (02/18 2148) BP: (88-119)/(44-77) 119/77 (02/18 2148) SpO2:  [95 %-99 %] 99 % (02/18 2148) Weight:  [134 lb 0.6 oz (60.8 kg)] 134 lb 0.6 oz (60.8 kg) (02/18 6387) Physical Exam: General: Sitting up in chair, awake and alert, afebrile Cardiovascular: RRR, no MRG Respiratory: CTAB, normal work of breathing Abdomen: soft nontender nondistended; no purulent discharge today    Extremities: Left brachiocephalic IV, nontender, not erythematous, no sign of active infection Neuro/psych: Moving extremities spontaneously throughout encounter  Bonnita Hollow, MD 10/31/2017, 6:40 AM PGY-1, [place] Intern pager: [phone], text pages welcome

## 2017-10-31 NOTE — Progress Notes (Signed)
Nutrition Follow-up  DOCUMENTATION CODES:   Non-severe (moderate) malnutrition in context of chronic illness  INTERVENTION:  Continue Ensure Enlive po BID, each supplement provides 350 kcal and 20 grams of protein  Continue Magic cup TID with meals, each supplement provides 290 kcal and 9 grams of protein  NUTRITION DIAGNOSIS:   Moderate Malnutrition related to chronic illness as evidenced by moderate muscle depletion, mild fat depletion, moderate fat depletion, mild muscle depletion, percent weight loss. -ongoing  GOAL:   Patient will meet greater than or equal to 90% of their needs -prgoressing  MONITOR:   PO intake, I & O's, Labs, Supplement acceptance, Weight trends  ASSESSMENT:   57 yo male with PMH schizoaffective disorder, presents with AMS and UTI  Spoke with patient this morning, he is more alert, responding appropriately to questions. Per NT he did not eat much, drank his juice. Drank an ensure this AM. PO intake averaging 42%  Disp: Awaiting Geri Psych unit placement  Labs reviewed  Medications reviewed and include:  Colace, MVI w/ Minerals  Diet Order:  DIET SOFT Room service appropriate? Yes; Fluid consistency: Thin  EDUCATION NEEDS:   Not appropriate for education at this time  Skin:  Skin Assessment: Reviewed RN Assessment  Last BM:  10/30/2017  Height:   Ht Readings from Last 1 Encounters:  10/20/17 5\' 10"  (1.778 m)    Weight:   Wt Readings from Last 1 Encounters:  10/30/17 134 lb 0.6 oz (60.8 kg)    Ideal Body Weight:  70 kg  BMI:  Body mass index is 19.23 kg/m.  Estimated Nutritional Needs:   Kcal:  1884-1660 calories  Protein:  89-102 grams  Fluid:  1.6-1.9L  Satira Anis. Krithik Mapel, MS, RD LDN Inpatient Clinical Dietitian Pager [phone]

## 2017-10-31 NOTE — Progress Notes (Signed)
Physical Therapy Treatment Patient Details Name: Franklin Ayers MRN: [id number] DOB: 1961-05-28 Today's Date: 10/31/2017    History of Present Illness Franklin Ayers a 57 y.o.malepresenting with altered mental status and a UTI. PMH is significant forschizoaffective disorder. Unsure of compliance with medications.    PT Comments    Patient progressing well towards PT goals. Participating in more conversations today with delayed response time and not always appropriate but verbal. Requires less assist for gait training today from prior session- Min guard for safety and for RW management. Pt veering to right/left during ambulation and bumping into wall/obstacles in hallway. BP soft but stable. Encouraged pt to increase activity. Will follow and progress as tolerated.     Follow Up Recommendations  Other (comment);Supervision for mobility/OOB(to behavorial health psych at dc)     Equipment Recommendations  Rolling walker with 5" wheels    Recommendations for Other Services       Precautions / Restrictions Precautions Precautions: Fall Precaution Comments: soft BP Restrictions Weight Bearing Restrictions: No    Mobility  Bed Mobility Overal bed mobility: Modified Independent Bed Mobility: Supine to Sit;Sit to Supine     Supine to sit: Modified independent (Device/Increase time);HOB elevated Sit to supine: Modified independent (Device/Increase time);HOB elevated   General bed mobility comments: No assist needed.   Transfers Overall transfer level: Needs assistance Equipment used: Rolling walker (2 wheeled) Transfers: Sit to/from Stand Sit to Stand: Min guard         General transfer comment: Min guard for safety. Stood from [employer]. No dizziness reported.   Ambulation/Gait Ambulation/Gait assistance: Min guard Ambulation Distance (Feet): 200 Feet Assistive device: Rolling walker (2 wheeled) Gait Pattern/deviations: Step-through pattern;Decreased stride  length;Narrow base of support;Staggering left;Drifts right/left;Staggering right Gait velocity: slow Gait velocity interpretation: Below normal speed for age/gender General Gait Details: Slow, mildly unsteady gait with pt veering from right/left and running into wall. Walks on heels despite cue.    Stairs            Wheelchair Mobility    Modified Rankin (Stroke Patients Only)       Balance Overall balance assessment: Needs assistance Sitting-balance support: Feet supported;No upper extremity supported Sitting balance-Leahy Scale: Good     Standing balance support: During functional activity;Bilateral upper extremity supported Standing balance-Leahy Scale: Poor Standing balance comment: Reliant on BUEs for support in standing.                            Cognition Arousal/Alertness: Awake/alert Behavior During Therapy: WFL for tasks assessed/performed Overall Cognitive Status: History of cognitive impairments - at baseline                                 General Comments: h/o schizophrenia. Pt more verbal today and able to carry on conversation but does have delayed response time. Answers to questions not always appropriate. Able to laugh appropriately.       Exercises      General Comments General comments (skin integrity, edema, etc.): BP pre mobility 106/62 BP post mobility 100/62; mother present during session.      Pertinent Vitals/Pain Pain Assessment: No/denies pain    Home Living                      Prior Function            PT Goals (current  goals can now be found in the care plan section) Progress towards PT goals: Progressing toward goals    Frequency    Min 2X/week      PT Plan Current plan remains appropriate    Co-evaluation              AM-PAC PT "6 Clicks" Daily Activity  Outcome Measure  Difficulty turning over in bed (including adjusting bedclothes, sheets and blankets)?:  None Difficulty moving from lying on back to sitting on the side of the bed? : None Difficulty sitting down on and standing up from a chair with arms (e.g., wheelchair, bedside commode, etc,.)?: None Help needed moving to and from a bed to chair (including a wheelchair)?: A Little Help needed walking in hospital room?: A Little Help needed climbing 3-5 steps with a railing? : A Little 6 Click Score: 21    End of Session Equipment Utilized During Treatment: Gait belt Activity Tolerance: Patient tolerated treatment well Patient left: in bed;with call bell/phone within reach;with bed alarm set;with family/visitor present Nurse Communication: Mobility status PT Visit Diagnosis: Unsteadiness on feet (R26.81);History of falling (Z91.81)     Time: 2800-3491 PT Time Calculation (min) (ACUTE ONLY): 22 min  Charges:  $Gait Training: 8-22 mins                    G Codes:       Wray Kearns, PT, DPT [phone]     [place] 10/31/2017, 3:17 PM

## 2017-11-01 LAB — BASIC METABOLIC PANEL
ANION GAP: 12 (ref 5–15)
BUN: 8 mg/dL (ref 6–20)
CHLORIDE: 97 mmol/L — AB (ref 101–111)
CO2: 28 mmol/L (ref 22–32)
Calcium: 8.9 mg/dL (ref 8.9–10.3)
Creatinine, Ser: 0.62 mg/dL (ref 0.61–1.24)
GFR calc Af Amer: >60 mL/min (ref >60)
GFR calc non Af Amer: >60 mL/min (ref >60)
GLUCOSE: 82 mg/dL (ref 65–99)
POTASSIUM: 3.9 mmol/L (ref 3.5–5.1)
Sodium: 137 mmol/L (ref 135–145)

## 2017-11-01 LAB — CBC
HEMATOCRIT: 30.7 % — AB (ref 39.0–52.0)
HEMOGLOBIN: 10.1 g/dL — AB (ref 13.0–17.0)
MCH: 31.1 pg (ref 26.0–34.0)
MCHC: 32.9 g/dL (ref 30.0–36.0)
MCV: 94.5 fL (ref 78.0–100.0)
PLATELETS: 202 10*3/uL (ref 150–400)
RBC: 3.25 MIL/uL — AB (ref 4.22–5.81)
RDW: 15.8 % — ABNORMAL HIGH (ref 11.5–15.5)
WBC: 5.2 10*3/uL (ref 4.0–10.5)

## 2017-11-01 NOTE — Progress Notes (Addendum)
Physical Therapy Treatment Patient Details Name: Franklin Ayers MRN: [id number] DOB: [birth year] Today's Date: 11/01/2017    History of Present Illness Franklin Ayers a 57 y.o.malepresenting with altered mental status and a UTI. PMH is significant forschizoaffective disorder. Unsure of compliance with medications.    PT Comments    Pt with soft BP (90/37) today with reports of light headed in standing. Per report pt no longer requiring behavioral health admission. Recommend ST-SNF to continue to work toward [place] and safety.   Follow Up Recommendations  SNF     Equipment Recommendations  Rolling walker with 5" wheels    Recommendations for Other Services       Precautions / Restrictions Precautions Precautions: Fall Precaution Comments: soft BP Restrictions Weight Bearing Restrictions: No    Mobility  Bed Mobility               General bed mobility comments: Pt up in chair  Transfers Overall transfer level: Needs assistance Equipment used: Rolling walker (2 wheeled) Transfers: Sit to/from Stand Sit to Stand: Min guard         General transfer comment: Assist for safety. Pt reports lightheaded  Ambulation/Gait             General Gait Details: not performed due to soft BP and lightheaded   Stairs            Wheelchair Mobility    Modified Rankin (Stroke Patients Only)       Balance Overall balance assessment: Needs assistance Sitting-balance support: Feet supported;No upper extremity supported Sitting balance-Leahy Scale: Good     Standing balance support: During functional activity;Bilateral upper extremity supported Standing balance-Leahy Scale: Poor Standing balance comment: walker and min guard for static standing. Stood x 45 sec.                            Cognition Arousal/Alertness: Awake/alert Behavior During Therapy: WFL for tasks assessed/performed Overall Cognitive Status: No  family/caregiver present to determine baseline cognitive functioning                                 General Comments: History of schizophrenia. Responding appropriately but slowed.      Exercises      General Comments        Pertinent Vitals/Pain      Home Living                      Prior Function            PT Goals (current goals can now be found in the care plan section) Progress towards PT goals: Not progressing toward goals - comment(soft BP)    Frequency    Min 2X/week      PT Plan Discharge plan needs to be updated    Co-evaluation              AM-PAC PT "6 Clicks" Daily Activity  Outcome Measure  Difficulty turning over in bed (including adjusting bedclothes, sheets and blankets)?: None Difficulty moving from lying on back to sitting on the side of the bed? : None Difficulty sitting down on and standing up from a chair with arms (e.g., wheelchair, bedside commode, etc,.)?: Unable Help needed moving to and from a bed to chair (including a wheelchair)?: A Little Help needed walking in hospital room?: Total Help needed climbing  3-5 steps with a railing? : Total 6 Click Score: 14    End of Session Equipment Utilized During Treatment: Gait belt Activity Tolerance: Patient tolerated treatment well;Treatment limited secondary to medical complications (Comment)(soft BP) Patient left: with call bell/phone within reach;in chair;with chair alarm set Nurse Communication: Mobility status PT Visit Diagnosis: Unsteadiness on feet (R26.81);History of falling (Z91.81)     Time: 1515-1530 PT Time Calculation (min) (ACUTE ONLY): 15 min  Charges:  $Therapeutic Activity: 8-22 mins                    G Codes:       [facility] PT [place] 11/01/2017, 3:53 PM

## 2017-11-01 NOTE — NC FL2 (Signed)
[place] LEVEL OF CARE SCREENING TOOL     IDENTIFICATION  Patient Name: Franklin Ayers Birthdate: 08/01/1961 Sex: male Admission Date (Current Location): 10/14/2017  [facility] and [place] Number:  Herbalist and Address:  [facility] [place], [place] [address] [place], [place] [address]      Provider Number: [id number]  Attending Physician Name and Address:  Lind Covert, MD  Relative Name and Phone Number:  Beckie Busing, mother, [phone]    Current Level of Care: Hospital Recommended Level of Care: [place] Prior Approval Number:    Date Approved/Denied:   PASRR Number:    Discharge Plan: SNF    Current Diagnoses: Patient Active Problem List   Diagnosis Date Noted  . Benign prostatic hyperplasia   . 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 Hematuria   . Soft tissue infection   . Malnutrition of moderate degree 10/18/2017  . Acute urinary retention   . Anemia   . Hypokalemia   . Bladder distension   . Urinary tract infection with hematuria   . Altered mental status 10/14/2017  . Schizoaffective disorder ([place]) 12/23/2016    Orientation RESPIRATION BLADDER Height & Weight     Self, Place  Normal Incontinent, External catheter Weight: 60.8 kg (134 lb 0.6 oz) Height:  5\' 10"  (177.8 cm)  BEHAVIORAL SYMPTOMS/MOOD NEUROLOGICAL BOWEL NUTRITION STATUS      Continent Diet(Please see DC Summary)  AMBULATORY STATUS COMMUNICATION OF NEEDS Skin   Limited Assist Verbally Normal                       Personal Care Assistance Level of Assistance  Bathing, Feeding, Dressing Bathing Assistance: Limited assistance Feeding assistance: Limited assistance Dressing Assistance: Limited assistance     Functional Limitations Info             SPECIAL CARE FACTORS FREQUENCY  PT (By licensed PT)     PT Frequency: 5x/week              Contractures      Additional Factors Info  Psychotropic Code Status  Info: Full Allergies Info: Penicillins Psychotropic Info: Depakote; Ativan; Seroquel         Current Medications (11/01/2017):  This is the current hospital active medication list Current Facility-Administered Medications  Medication Dose Route Frequency Provider Last Rate Last Dose  . acetaminophen (TYLENOL) tablet 650 mg  650 mg Oral Q6H PRN Mayo, Pete Pelt, MD   650 mg at 10/18/17 1510   Or  . acetaminophen (TYLENOL) suppository 650 mg  650 mg Rectal Q6H PRN Mayo, Pete Pelt, MD      . aluminum hydroxide-magnesium carbonate (GAVISCON) 95-358 MG/15ML suspension 30 mL  30 mL Oral PRN Bonnita Hollow, MD      . divalproex (DEPAKOTE ER) 24 hr tablet 1,000 mg  1,000 mg Oral Daily Lind Covert, MD   1,000 mg at 10/31/17 2144  . divalproex (DEPAKOTE ER) 24 hr tablet 500 mg  500 mg Oral Q24H Lind Covert, MD   500 mg at 11/01/17 0836  . docusate sodium (COLACE) capsule 200 mg  200 mg Oral Daily Nicolette Bang, DO   200 mg at 11/01/17 6644  . enoxaparin (LOVENOX) injection 40 mg  40 mg Subcutaneous Q24H Mayo, Pete Pelt, MD   40 mg at 10/31/17 2144  . feeding supplement (ENSURE ENLIVE) (ENSURE ENLIVE) liquid 237 mL  237 mL Oral BID BM Talbert Cage  L, MD   237 mL at 11/01/17 1400  . LORazepam (ATIVAN) tablet 1 mg  1 mg Oral Q8H Bonnita Hollow, MD   1 mg at 11/01/17 1307  . multivitamin (PROSIGHT) tablet 1 tablet  1 tablet Oral Daily Mayo, Pete Pelt, MD   1 tablet at 11/01/17 [phone]  . mupirocin cream (BACTROBAN) 2 %   Topical Daily Josephine Igo B, MD      . ondansetron [facility]) tablet 4 mg  4 mg Oral Q6H PRN Mayo, Pete Pelt, MD       Or  . ondansetron [facility]) injection 4 mg  4 mg Intravenous Q6H PRN Mayo, Pete Pelt, MD      . polyethylene glycol (MIRALAX / GLYCOLAX) packet 17 g  17 g Oral Daily Nicolette Bang, DO   17 g at 11/01/17 3825  . polyvinyl alcohol (LIQUIFILM TEARS) 1.4 % ophthalmic solution 1 drop  1 drop Both Eyes Daily Bonnita Hollow, MD   1 drop at 11/01/17 [phone]  . QUEtiapine (SEROQUEL) tablet 100 mg  100 mg Oral Q supper Lind Covert, MD   100 mg at 10/31/17 1714  . QUEtiapine (SEROQUEL) tablet 200 mg  200 mg Oral Daily Lind Covert, MD   200 mg at 10/31/17 2143  . tamsulosin (FLOMAX) capsule 0.8 mg  0.8 mg Oral Daily Bonnita Hollow, MD   0.8 mg at 11/01/17 7673     Discharge Medications: Please see discharge summary for a list of discharge medications.  Relevant Imaging Results:  Relevant Lab Results:   Additional Information SSN: [place], [place]

## 2017-11-01 NOTE — Consult Note (Signed)
Franklin Ayers Psych Consult Progress Note  11/01/2017 11:17 AM Franklin Ayers  MRN:  [id number] Subjective:  Franklin Ayers was last seen on 2/6. Ativan was started for catatonia with good effect. On interview, he reports that he is doing better. He was oriented to person, place and date. He is able to name his medications. He reports that he feels like he takes too many pills and that his medications may make it hard to digest his food. He reports fair sleep and appetite. He denies SI, HI or AVH. His mother visited a couple days ago. He reports enjoying the visit.     Principal Problem: Schizoaffective disorder ([place]) Diagnosis:   Patient Active Problem List   Diagnosis Date Noted  . Benign prostatic hyperplasia [N40.0]   . 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 Hematuria [R31.9]   . Soft tissue infection [L08.9]   . Malnutrition of moderate degree [E44.0] 10/18/2017  . Acute urinary retention [R33.8]   . Anemia [D64.9]   . Hypokalemia [E87.6]   . Bladder distension [N32.89]   . Urinary tract infection with hematuria [N39.0, R31.9]   . Altered mental status [R41.82] 10/14/2017  . Schizoaffective disorder ([place]) [F25.9] 12/23/2016   Total Time spent with patient: 15 minutes  Past Psychiatric History: Schizoaffective disorder  Past Medical History:  Past Medical History:  Diagnosis Date  . Manic depression ([place])   . Mental disorder   . Schizoaffective disorder [facility])     Past Surgical History:  Procedure Laterality Date  . TONSILLECTOMY     Family History:  Family History  Problem Relation Age of Onset  . Cancer Other   . Stroke Other    Family Psychiatric  History: Unknown  Social History:  Social History   Substance and Sexual Activity  Alcohol Use No  . Frequency: Never   Comment: occasional     Social History   Substance and Sexual Activity  Drug Use No    Social History   Socioeconomic History  . Marital status: Single    Spouse name: None  . Number of  children: None  . Years of education: None  . Highest education level: None  Social Needs  . Financial resource strain: None  . Food insecurity - worry: None  . Food insecurity - inability: None  . Transportation needs - medical: None  . Transportation needs - non-medical: None  Occupational History  . None  Tobacco Use  . Smoking status: Former Smoker    Packs/day: 2.00    Types: Cigarettes  . Smokeless tobacco: Never Used  . Tobacco comment: refused  Substance and Sexual Activity  . Alcohol use: No    Frequency: Never    Comment: occasional  . Drug use: No  . Sexual activity: No  Other Topics Concern  . None  Social History Narrative  . None    Sleep: Fair  Appetite:  Fair  Current Medications: Current Facility-Administered Medications  Medication Dose Route Frequency Provider Last Rate Last Dose  . acetaminophen (TYLENOL) tablet 650 mg  650 mg Oral Q6H PRN Mayo, Pete Pelt, MD   650 mg at 10/18/17 1510   Or  . acetaminophen (TYLENOL) suppository 650 mg  650 mg Rectal Q6H PRN Mayo, Pete Pelt, MD      . aluminum hydroxide-magnesium carbonate (GAVISCON) 95-358 MG/15ML suspension 30 mL  30 mL Oral PRN Franklin Hollow, MD      . divalproex (DEPAKOTE ER) 24 hr tablet 1,000 mg  1,000 mg Oral  Daily Franklin Covert, MD   1,000 mg at 10/31/17 2144  . divalproex (DEPAKOTE ER) 24 hr tablet 500 mg  500 mg Oral Q24H Franklin Covert, MD   500 mg at 11/01/17 0836  . docusate sodium (COLACE) capsule 200 mg  200 mg Oral Daily Franklin Bang, Franklin Ayers   200 mg at 11/01/17 0626  . enoxaparin (LOVENOX) injection 40 mg  40 mg Subcutaneous Q24H Mayo, Pete Pelt, MD   40 mg at 10/31/17 2144  . feeding supplement (ENSURE ENLIVE) (ENSURE ENLIVE) liquid 237 mL  237 mL Oral BID BM Franklin Covert, MD   237 mL at 11/01/17 9485  . LORazepam (ATIVAN) tablet 1 mg  1 mg Oral Q8H Franklin Hollow, MD   1 mg at 11/01/17 0555  . multivitamin (PROSIGHT) tablet 1 tablet  1 tablet  Oral Daily Mayo, Pete Pelt, MD   1 tablet at 11/01/17 [phone]  . mupirocin cream (BACTROBAN) 2 %   Topical Daily Franklin Igo B, MD      . ondansetron [facility]) tablet 4 mg  4 mg Oral Q6H PRN Mayo, Pete Pelt, MD       Or  . ondansetron [facility]) injection 4 mg  4 mg Intravenous Q6H PRN Mayo, Pete Pelt, MD      . polyethylene glycol (MIRALAX / GLYCOLAX) packet 17 g  17 g Oral Daily Franklin Bang, Franklin Ayers   17 g at 11/01/17 0350  . polyvinyl alcohol (LIQUIFILM TEARS) 1.4 % ophthalmic solution 1 drop  1 drop Both Eyes Daily Franklin Hollow, MD   1 drop at 11/01/17 [phone]  . QUEtiapine (SEROQUEL) tablet 100 mg  100 mg Oral Q supper Franklin Covert, MD   100 mg at 10/31/17 1714  . QUEtiapine (SEROQUEL) tablet 200 mg  200 mg Oral Daily Franklin Covert, MD   200 mg at 10/31/17 2143  . tamsulosin (FLOMAX) capsule 0.8 mg  0.8 mg Oral Daily Franklin Hollow, MD   0.8 mg at 11/01/17 1829    Lab Results:  Results for orders placed or performed during the hospital encounter of 10/14/17 (from the past 48 hour(s))  CBC     Status: Abnormal   Collection Time: 11/01/17  4:10 AM  Result Value Ref Range   WBC 5.2 4.0 - 10.5 K/uL   RBC 3.25 (L) 4.22 - 5.81 MIL/uL   Hemoglobin 10.1 (L) 13.0 - 17.0 g/dL   HCT 30.7 (L) 39.0 - 52.0 %   MCV 94.5 78.0 - 100.0 fL   MCH 31.1 26.0 - 34.0 pg   MCHC 32.9 30.0 - 36.0 g/dL   RDW 15.8 (H) 11.5 - 15.5 %   Platelets 202 150 - 400 K/uL    Comment: Performed at [facility], [place] [address] [place],  [address]  Basic metabolic panel     Status: Abnormal   Collection Time: 11/01/17  4:10 AM  Result Value Ref Range   Sodium 137 135 - 145 mmol/L   Potassium 3.9 3.5 - 5.1 mmol/L   Chloride 97 (L) 101 - 111 mmol/L   CO2 28 22 - 32 mmol/L   Glucose, Bld 82 65 - 99 mg/dL   BUN 8 6 - 20 mg/dL   Creatinine, Ser 0.62 0.61 - 1.24 mg/dL   Calcium 8.9 8.9 - 10.3 mg/dL   GFR calc non Af Amer >60 >60 mL/min   GFR calc Af Amer >60 >60 mL/min     Comment: (  NOTE) The eGFR has been calculated using the CKD EPI equation. This calculation has not been validated in all clinical situations. eGFR's persistently <60 mL/min signify possible Chronic Kidney Disease.    Anion gap 12 5 - 15    Comment: Performed at [facility] [address] [place], [place] [address]    Blood Alcohol level:  Lab Results  Component Value Date   [facility] <10 10/14/2017   ETH <10 09/08/2017    Musculoskeletal: Strength & Muscle Tone: within normal limits Gait & Station: UTA since patient was sitting in a chair.  Patient leans: N/A  Psychiatric Specialty Exam: Physical Exam  Nursing note and vitals reviewed. Constitutional: He is oriented to person, place, and time. He appears well-developed and well-nourished.  HENT:  Head: Normocephalic and atraumatic.  Neck: Normal range of motion.  Respiratory: Effort normal.  Musculoskeletal: Normal range of motion.  Neurological: He is alert and oriented to person, place, and time.  Skin: No rash noted.  Psychiatric: He has a normal mood and affect. His speech is normal and behavior is normal. Judgment and thought content normal. Cognition and memory are normal.    Review of Systems  Constitutional: Negative for chills and fever.  Cardiovascular: Negative for chest pain.  Gastrointestinal: Positive for constipation. Negative for abdominal pain, diarrhea, nausea and vomiting.  Genitourinary: Positive for dysuria.  Psychiatric/Behavioral: Negative for depression, hallucinations, substance abuse and suicidal ideas. The patient has insomnia. The patient is not nervous/anxious.     Blood pressure (!) 95/55, pulse 73, temperature 99.2 F (37.3 C), temperature source Oral, resp. rate 16, height 5' 10"  (1.778 m), weight 60.8 kg (134 lb 0.6 oz), SpO2 98 %.Body mass index is 19.23 kg/m.  General Appearance: Fairly Groomed, middle aged, Caucasian male with unshaved face and balding head, wearing a hospital gown  and sitting in a chair. NAD.   Eye Contact:  Good  Speech:  Clear and Coherent and Normal Rate  Volume:  Normal  Mood:  "Better"  Affect:  Appropriate and Full Range  Thought Process:  Goal Directed, Linear and Descriptions of Associations: Intact  Orientation:  Full (Time, Place, and Person)  Thought Content:  Logical  Suicidal Thoughts:  No  Homicidal Thoughts:  No  Memory:  Immediate;   Good Recent;   Good Remote;   Good  Judgement:  Fair  Insight:  Fair  Psychomotor Activity:  Normal  Concentration:  Concentration: Good and Attention Span: Good  Recall:  Good  Fund of Knowledge:  Good  Language:  Fair  Akathisia:  No  Handed:  Right  AIMS (if indicated):   N/A  Assets:  Financial Resources/Insurance Housing Social Support  ADL's:  Intact  Cognition:  WNL  Sleep:   Fair   Assessment: Izael Bessinger is a 57 y.o. male who was admitted with altered mental status and found to have an UTI with hematuria. Psychiatry was consulted on 2/3 for psychosis. During this time, he was not eating, drinking, talking or taking his medications at home. He was nearly mute on interview and exhibited stereotypic movements and grimacing. He was treated with Ativan with good effect for catatonia likely secondary to his primary psychiatric illness. He is interactive in interview today. He denies SI, HI or AVH. He appears to be at his baseline. He is psychiatrically cleared and would be appropriate for discharge to a SNF to assist with medical needs since his mother is no longer able to care for him.  Treatment Plan Summary: -Continue Ativan 1 mg TID for catatonia.  -Continue Depakote 500 mg q am and 1000 mg qhs for mood stabilization. -Continue Seroquel 300 mg daily for psychosis. QTc 433 on 2/3.  -Patient is psychiatrically cleared. Psychiatry will sign off on patient at this time. Please consult psychiatry again as needed.   Faythe Dingwall, Franklin Ayers 11/01/2017, 11:17 AM

## 2017-11-01 NOTE — Progress Notes (Signed)
CSW paged MD to request Psych to assess patient again. Can patient be discharged to SNF instead of psych?  Percell Locus Atom Solivan LCSW [phone]

## 2017-11-01 NOTE — Progress Notes (Signed)
Family Medicine Teaching Service Daily Progress Note Intern Pager: [phone]  Patient name: Franklin Ayers Medical record number: [id number] Date of birth: [birth year] Age: 57 y.o. Gender: male  Primary Care Provider: Barrie Lyme, FNP Consultants: Psychiatry Code Status: Full  Assessment and Plan: Franklin Ayers is a 57 y.o. male presenting with altered mental status and a UTI. PMH is significant for schizoaffective disorder.  Catatonia  Acute psychosis  Schizoaffective disorder -Ativan 1 mg q8hr -Psychiatry recs -inpatient management now that patient is medically stable -Depakote 500mg  in the AM and 1000mg  at 8 PM  -Seroquel 300mg  at 8 PM -Will monitor mental status closely  -BMP q3d to maintain inpatient psych necessity  -Tylenol PRN  Left arm pain Patient complaining of left arm pain at IV site.  IV does not look erythematous, not tender to palpation.  He has been afebrile. -Monitor IV site -Monitor fever curve -Monitor CBC -Tylenol PRN  Acute urinary retention secondary to BPH, resolved Patient is voiding normally without retention.   -Tamsulosin 0.8 mg qd -Strict I/Os - will discuss need for continued I/Os with SW   Soft tissue infection with purulent drainage, improved: Wound cultures shows abundant Corynebacterium that has been appropriately treated with doxy 100mg  BID x5d per ID.  -Continue Bactroban 2% cream to area, daily clean wound with clean room and apply adhesive bandage  Moderate malnutrition due to chronic illness  -Nutrition consult appreciated -Assist with feeds -Nutritional supplement   Social: Mother concerned about her ability to take of patient after discharge and feels that assisted living facility will be best for patient.  -Awaiting geri unit psych placement  FEN/GI:  Soft diet due to poor dentition Prophylaxis: Lovenox  Disposition: Medically stable for discharge, awaiting placement to inpatient psych  Subjective:  Patient sitting  in bed. No complaints. objective: Temp:  [97.9 F (36.6 C)-99.2 F (37.3 C)] 97.9 F (36.6 C) (02/20 1331) Pulse Rate:  [73-81] 79 (02/20 1331) Resp:  [16-18] 18 (02/20 1331) BP: (89-95)/(51-55) 92/51 (02/20 1331) SpO2:  [96 %-100 %] 100 % (02/20 1331) Physical Exam: General: Sitting up in chair, awake and alert, afebrile Cardiovascular: RRR, no MRG Respiratory: CTAB, normal work of breathing Abdomen: soft nontender nondistended; no purulent discharge today    Extremities: Left brachiocephalic IV, nontender, not erythematous, no sign of active infection Neuro/psych: Moving extremities spontaneously throughout encounter  Bonnita Hollow, MD 11/01/2017, 3:24 PM PGY-1, [place] Intern pager: [phone], text pages welcome

## 2017-11-02 NOTE — Progress Notes (Addendum)
2/22: [facility] or [facility] can accept patient once [facility] clears him.  2/21: CSW faxed requested info to [facility]. Patient is now under [facility] Assignment (they will have to come assess patient).  Percell Locus Reyanna Baley LCSW [phone]

## 2017-11-02 NOTE — Progress Notes (Signed)
Patient has no SNF bed offers. CSW continuing to search. [facility] pending.   Percell Locus Rexton Greulich LCSW [phone]

## 2017-11-02 NOTE — Progress Notes (Signed)
Family Medicine Teaching Service Daily Progress Note Intern Pager: [phone]  Patient name: Franklin Ayers Medical record number: [id number] Date of birth: 06-19-1961 Age: 57 y.o. Gender: male  Primary Care Provider: Barrie Lyme, FNP Consultants: Psychiatry Code Status: Full  Assessment and Plan: Franklin Ayers is a 57 y.o. male presenting with altered mental status and a UTI. PMH is significant for schizoaffective disorder.  Catatonia  Acute psychosis  Schizoaffective disorder -Medically stable for discharge -Ativan 1 mg q8hr -Psychiatry has cleared patient for discharge to SNF given improvement. -Depakote 500mg  in the AM and 1000mg  at 8 PM  -Seroquel 300mg  at 8 PM -Will monitor mental status closely  -BMP q3d to maintain inpatient psych necessity  -Tylenol PRN  Left arm pain Patient complaining of left arm pain at IV site.  IV does not look erythematous, not tender to palpation.  He has been afebrile. -Monitor IV site -Monitor fever curve -Monitor CBC -Tylenol PRN  Acute urinary retention secondary to BPH, resolved Patient is voiding normally without retention.   -Tamsulosin 0.8 mg qd -Strict I/Os - will discuss need for continued I/Os with SW   Soft tissue infection with purulent drainage, resolved:  Wound cultures shows abundant Corynebacterium that has been appropriately treated with doxy 100mg  BID x5d per ID.  -Continue Bactroban 2% cream to area, daily clean wound with clean room and apply adhesive bandage  Moderate malnutrition due to chronic illness  -Nutrition consult appreciated -Assist with feeds -Nutritional supplement   Social: Mother concerned about her ability to take of patient after discharge and feels that assisted living facility will be best for patient.  -Awaiting geri unit psych placement  FEN/GI:  Soft diet due to poor dentition Prophylaxis: Lovenox  Disposition: Medically stable for discharge, awaiting placement to SNF  Subjective:   Lying in bed.  Awake.  Has no complaints. objective: Temp:  [97.9 F (36.6 C)-98.4 F (36.9 C)] 98.2 F (36.8 C) (02/21 0430) Pulse Rate:  [77-81] 81 (02/21 0430) Resp:  [18] 18 (02/21 0430) BP: (90-93)/(50-57) 90/50 (02/21 0430) SpO2:  [96 %-100 %] 96 % (02/21 0430) Physical Exam: General: Lying in bed, awake and alert,  Cardiovascular: RRR, no MRG Respiratory: CTAB, normal work of breathing Abdomen: soft nontender nondistended; no purulent discharge today    Neuro: Alert and oriented,  moving extremities spontaneously  Psych: Slow to verbalize answers however they seem appropriate   Bonnita Hollow, MD 11/02/2017, 8:29 AM PGY-1, [place] Intern pager: [phone], text pages welcome

## 2017-11-02 NOTE — Discharge Instructions (Addendum)
Schizoaffective Disorder Schizoaffective disorder (ScAD) is a mental illness. It causes symptoms that are a mixture of a psychotic disorder (schizophrenia) and a mood (affective) disorder. A psychotic disorder involves losing touch with reality. ScAD usually occurs in cycles. Periods of severe symptoms may be followed by periods of less severe symptoms or improvement. The illness affects men and women equally, but it usually appears at an earlier age (teenage or early adult years) in men. People who have family members with schizophrenia, bipolar disorder, or ScAD are at higher risk of developing ScAD. ScAD may interfere with personal relationships or normal daily activities. People with ScAD are at a higher risk for:  Job loss.  Social aloneness (isolation).  Health problems.  Anxiety.  Substance use disorders.  Suicide.  What are the causes? The exact cause of this condition is not known. What increases the risk? The following factors may make you more likely to develop this condition:  Problems during your mother's pregnancy and after your birth, such as: ? Your mother having the flu (influenza) during the second semester of the pregnancy. ? Exposure to drugs, alcohol, illnesses, or other poisons (toxins) before birth. ? Low birth weight.  A brain infection or viral infection.  Problems with brain structure or function.  Having family members with bipolar disorder, ScAD, or schizophrenia.  Substance abuse.  Having been diagnosed with a mental health condition in the past.  Being a victim of neglect or long-term (chronic) abuse.  What are the signs or symptoms? At any one time, people with ScAD may have psychotic symptoms only or have both psychotic and affective symptoms. Psychotic symptoms may include:  Hearing, seeing, or feeling things that are not there (hallucinations).  Having fixed, false beliefs (delusions). The delusions usually include being attacked,  harassed, or plotted against (paranoid delusions).  Speaking in a way that makes no sense to others (disorganized speech).  Confusing or odd behavior.  Loss of motivation for normal daily activities, such as self-care.  Withdrawal from social contacts (social isolation).  Lack of emotions.  Affective symptoms may include:  Symptoms similar to major depression, such as: ? Depressed mood. ? Loss of interest in activities that are usually pleasurable (anhedonia). ? Sleeping more or less than normal. ? Feeling worthless or excessively guilty. ? Lack of energy or motivation. ? Trouble concentrating. ? Eating more or less than usual. ? Thinking a lot about death or suicide.  Symptoms similar to bipolar mania. Bipolar mania refers to periods of severe elation, irritability, and high energy that are experienced by people who have bipolar disorder. These symptoms may include: ? Abnormally elevated or irritable mood. ? Abnormally increased energy or activity. ? More confidence than normal or feeling that you are able to do anything (grandiosity). ? Feeling rested after getting less sleep than normal. ? Being easily distracted. ? Talking more than usual or feeling pressure to keep talking. ? Feeling that your thoughts are racing. ? Engaging in high-risk activities.  How is this diagnosed? ScAD is diagnosed through an assessment by your health care provider. Your health care provider may refer you to a mental health specialist for evaluation. The mental health specialist:  Will observe and ask questions about your thoughts, behavior, mood, and ability to function in daily life.  May ask questions about your medical history and use of drugs, including prescription medicines. Certain medical conditions and substances can cause symptoms that resemble ScAD.  May do blood tests and imaging tests.  There are two  types of ScAD:  Depressive ScAD. This type is diagnosed when you have only  depressive symptoms.  Bipolar ScAD. This type is diagnosed if your affective symptoms are only manic or are a mixture of manic and depressive.  How is this treated? ScAD is usually a lifelong (chronic) illness that requires long-term treatment. Treatment may include:  Medicine. Different types of medicine are used to treat ScAD. The exact combination depends on the type and severity of your symptoms. ? Antipsychotic medicine may be used to control psychotic symptoms such as delusions, paranoia, and hallucinations. ? Mood stabilizers may be used to balance the highs and lows of bipolar manic mood swings. ? Antidepressant medicines may be used to treat depressive symptoms.  Counseling or talk therapy. Individual, group, or family counseling may be helpful in providing education, support, and guidance. Many people with ScAD also benefit from social skills and job skills (vocational) training.  A combination of medicine and counseling is usually best for managing the disorder over time. A procedure in which electricity is applied to the brain through the scalp (electroconvulsive therapy) may be used to treat people with severe manic symptoms who do not respond to medicine and counseling. Follow these instructions at home:  Take over-the-counter and prescription medicines only as told by your health care provider. Check with your health care provider before starting new medicines.  Surround yourself with people who care about you and can help you manage your condition.  Keep stress under control. Stress may make symptoms worse.  Avoid alcohol and drugs. They can affect how medicine works and make symptoms worse.  Keep all follow-up visits as told by your health care provider and counselor. This is important. Contact a health care provider if:  You are not able to take your medicines as prescribed.  Your symptoms get worse. Get help right away if:  You feel out of control.  You or others  notice warning signs of suicide, such as: ? Increased use of drugs or alcohol ? Expressing feelings of not having a purpose in life or feeling trapped, guilty, anxious, agitated, or hopeless. ? Withdrawing from friends and family. ? Showing uncontrolled anger, recklessness, and dramatic mood changes. ? Talking about suicide or searching for methods. If you ever feel like you may hurt yourself or others, or have thoughts about taking your own life, get help right away. You can go to your nearest emergency department or call:  Your local emergency services (911 in the [place]  A suicide crisis helpline, such as the [place] [facility] at 443-492-2661. This is open 24 hours a day.  Summary  Schizoaffective disorder causes symptoms that are a mixture of a psychotic disorder and a mood disorder.  A combination of medicine and counseling is usually best for managing the disorder over time.  People who have schizoaffective disorder are at risk for suicide. Get help right away if you or someone else notices warning signs of suicide. This information is not intended to replace advice given to you by your health care provider. Make sure you discuss any questions you have with your health care provider. Document Released: 01/09/2007 Document Revised: 06/10/2016 Document Reviewed: 06/10/2016 Elsevier Interactive Patient Education  2018 [place].   Acute Urinary Retention, Male Acute urinary retention is when you are unable to pee (urinate). Acute urinary retention is common in older men. Prostates can get bigger, which blocks the flow of pee. Follow these instructions at home:  Drink enough fluids to  keep your pee clear or pale yellow.  If you are sent home with a tube that drains the bladder (catheter), there will be a drainage bag attached to it. There are two types of bags. One is big that you can wear at night without having to empty it. One is smaller and needs to be  emptied more often. ? Keep the drainage bag empty. ? Keep the drainage bag lower than your catheter.  Only take medicine as told by your doctor. Contact a doctor if:  You have a low-grade fever.  You have spasms or you are leaking pee when you have spasms. Get help right away if:  You have chills or a fever.  Your catheter stops draining pee.  Your catheter falls out.  You have increased bleeding that does not stop after you have rested and increased the amount of fluids you had been drinking. This information is not intended to replace advice given to you by your health care provider. Make sure you discuss any questions you have with your health care provider. Document Released: 02/15/2008 Document Revised: 02/04/2016 Document Reviewed: 02/07/2013 Elsevier Interactive Patient Education  2017 [employer]  Urinary Tract Infection, Adult A urinary tract infection (UTI) is an infection of any part of the urinary tract. The urinary tract includes the:  Kidneys.  Ureters.  Bladder.  Urethra.  These organs make, store, and get rid of pee (urine) in the body. Follow these instructions at home:  Take over-the-counter and prescription medicines only as told by your doctor.  If you were prescribed an antibiotic medicine, take it as told by your doctor. Do not stop taking the antibiotic even if you start to feel better.  Avoid the following drinks: ? Alcohol. ? Caffeine. ? Tea. ? Carbonated drinks.  Drink enough fluid to keep your pee clear or pale yellow.  Keep all follow-up visits as told by your doctor. This is important.  Make sure to: ? Empty your bladder often and completely. Do not to hold pee for long periods of time. ? Empty your bladder before and after sex. ? Wipe from front to back after a bowel movement if you are male. Use each tissue one time when you wipe. Contact a doctor if:  You have back pain.  You have a fever.  You feel sick to your stomach  (nauseous).  You throw up (vomit).  Your symptoms do not get better after 3 days.  Your symptoms go away and then come back. Get help right away if:  You have very bad back pain.  You have very bad lower belly (abdominal) pain.  You are throwing up and cannot keep down any medicines or water. This information is not intended to replace advice given to you by your health care provider. Make sure you discuss any questions you have with your health care provider. Document Released: 02/15/2008 Document Revised: 02/04/2016 Document Reviewed: 07/20/2015 Elsevier Interactive Patient Education  2018 [employer].   Cellulitis, Adult Cellulitis is a skin infection. The infected area is usually red and sore. This condition occurs most often in the arms and lower legs. It is very important to get treated for this condition. Follow these instructions at home:  Take over-the-counter and prescription medicines only as told by your doctor.  If you were prescribed an antibiotic medicine, take it as told by your doctor. Do not stop taking the antibiotic even if you start to feel better.  Drink enough fluid to keep your pee (  urine) clear or pale yellow.  Do not touch or rub the infected area.  Raise (elevate) the infected area above the level of your heart while you are sitting or lying down.  Place warm or cold wet cloths (warm or cold compresses) on the infected area. Do this as told by your doctor.  Keep all follow-up visits as told by your doctor. This is important. These visits let your doctor make sure your infection is not getting worse. Contact a doctor if:  You have a fever.  Your symptoms do not get better after 1-2 days of treatment.  Your bone or joint under the infected area starts to hurt after the skin has healed.  Your infection comes back. This can happen in the same area or another area.  You have a swollen bump in the infected area.  You have new symptoms.  You  feel ill and also have muscle aches and pains. Get help right away if:  Your symptoms get worse.  You feel very sleepy.  You throw up (vomit) or have watery poop (diarrhea) for a long time.  There are red streaks coming from the infected area.  Your red area gets larger.  Your red area turns darker. This information is not intended to replace advice given to you by your health care provider. Make sure you discuss any questions you have with your health care provider. Document Released: 02/15/2008 Document Revised: 02/04/2016 Document Reviewed: 07/08/2015 Elsevier Interactive Patient Education  2018 [employer].

## 2017-11-03 NOTE — Progress Notes (Signed)
Physical Therapy Treatment Patient Details Name: Franklin Ayers MRN: [id number] DOB: [birth year] Today's Date: 11/03/2017    History of Present Illness Franklin Ayers a 57 y.o.malepresenting with altered mental status and a UTI. PMH is significant forschizoaffective disorder. Unsure of compliance with medications.    PT Comments    Pt with improved mobility this session. Gait was initially very slow and hesitant with pt heel walking. Pt reports this is his baseline, however as distance increased pt's speed and gait quality showed improvement. Pt slightly impulsive with standing and needing cues to keep RW with him when ambulating in room. He would benefit from continued skilled PT to increase safety with mobility and functional independence.    Follow Up Recommendations  SNF     Equipment Recommendations  Rolling walker with 5" wheels    Recommendations for Other Services       Precautions / Restrictions Precautions Precautions: Fall Precaution Comments: soft BP Restrictions Weight Bearing Restrictions: No    Mobility  Bed Mobility Overal bed mobility: Modified Independent Bed Mobility: Supine to Sit     Supine to sit: Modified independent (Device/Increase time);HOB elevated     General bed mobility comments: Increased time, no physical assist  Transfers Overall transfer level: Needs assistance Equipment used: Rolling walker (2 wheeled) Transfers: Sit to/from Stand Sit to Stand: Supervision         General transfer comment: Supervision for safety  Ambulation/Gait Ambulation/Gait assistance: Min guard Ambulation Distance (Feet): 250 Feet Assistive device: Rolling walker (2 wheeled) Gait Pattern/deviations: Step-through pattern;Decreased stride length;Narrow base of support;Staggering left;Drifts right/left;Staggering right Gait velocity: slow Gait velocity interpretation: Below normal speed for age/gender General Gait Details: Pt initially with slow  hesitent steps, heel walking to start. Pt reports this is his baseline. Towards end of ambulation pt's velocity improved along with quality of gait. Cues to take walker all the way to chair prior to sitting.   Stairs            Wheelchair Mobility    Modified Rankin (Stroke Patients Only)       Balance Overall balance assessment: Needs assistance Sitting-balance support: Feet supported;No upper extremity supported Sitting balance-Leahy Scale: Good Sitting balance - Comments: Pt able to don socks in long sitting with min guard.   Standing balance support: During functional activity;Bilateral upper extremity supported Standing balance-Leahy Scale: Poor Standing balance comment: RW and min guard for gait                            Cognition Arousal/Alertness: Awake/alert Behavior During Therapy: WFL for tasks assessed/performed Overall Cognitive Status: No family/caregiver present to determine baseline cognitive functioning                                 General Comments: History of schizophrenia. Responding appropriately but slowed.      Exercises      General Comments        Pertinent Vitals/Pain Pain Assessment: No/denies pain    Home Living                      Prior Function            PT Goals (current goals can now be found in the care plan section) Acute Rehab PT Goals Patient Stated Goal: didn't state PT Goal Formulation: Patient unable to participate in goal setting Time  For Goal Achievement: 11/06/17 Potential to Achieve Goals: Fair Progress towards PT goals: Progressing toward goals    Frequency    Min 2X/week      PT Plan Current plan remains appropriate    Co-evaluation              AM-PAC PT "6 Clicks" Daily Activity  Outcome Measure  Difficulty turning over in bed (including adjusting bedclothes, sheets and blankets)?: None Difficulty moving from lying on back to sitting on the side of  the bed? : None Difficulty sitting down on and standing up from a chair with arms (e.g., wheelchair, bedside commode, etc,.)?: Unable Help needed moving to and from a bed to chair (including a wheelchair)?: A Little Help needed walking in hospital room?: A Little Help needed climbing 3-5 steps with a railing? : Total 6 Click Score: 16    End of Session Equipment Utilized During Treatment: Gait belt Activity Tolerance: Patient tolerated treatment well(soft BP) Patient left: with call bell/phone within reach;in chair;with chair alarm set Nurse Communication: Mobility status PT Visit Diagnosis: Unsteadiness on feet (R26.81);History of falling (Z91.81)     Time: 9675-9163 PT Time Calculation (min) (ACUTE ONLY): 20 min  Charges:  $Gait Training: 8-22 mins                    G Codes:       Benjiman Core, [place] Pager [phone] Acute Rehab   Allena Katz 11/03/2017, 3:09 PM

## 2017-11-03 NOTE — Progress Notes (Signed)
Family Medicine Teaching Service Daily Progress Note Intern Pager: [phone]  Patient name: Franklin Ayers Medical record number: [id number] Date of birth: 05-Aug-1961 Age: 57 y.o. Gender: male  Primary Care Provider: Barrie Lyme, FNP Consultants: Psychiatry Code Status: Full  Assessment and Plan: Franklin Ayers is a 57 y.o. male presenting with altered mental status and a UTI. PMH is significant for schizoaffective disorder.  Catatonia  Acute psychosis  Schizoaffective disorder -Medically stable for discharge -Ativan 1 mg q8hr -Psychiatry has cleared patient for discharge to SNF given improvement. -Depakote 500mg  in the AM and 1000mg  at 8 PM  -Seroquel 300mg  at 8 PM -Will monitor mental status closely  -BMP q3d to maintain inpatient psych necessity  -Tylenol PRN  Left arm pain Patient complaining of left arm pain at IV site.  IV does not look erythematous, not tender to palpation.  He has been afebrile. -Monitor IV site -Monitor fever curve -Monitor CBC -Tylenol PRN  Acute urinary retention secondary to BPH, resolved Patient is voiding normally without retention.   -Tamsulosin 0.8 mg qd -Strict I/Os - will discuss need for continued I/Os with SW   Soft tissue infection with purulent drainage, resolved:  Wound cultures shows abundant Corynebacterium that has been appropriately treated with doxy 100mg  BID x5d per ID.  -Continue Bactroban 2% cream to area, daily clean wound with clean room and apply adhesive bandage  Moderate malnutrition due to chronic illness  -Nutrition consult appreciated -Assist with feeds -Nutritional supplement   Social: Mother concerned about her ability to take of patient after discharge and feels that assisted living facility will be best for patient.  -Awaiting geri unit psych placement  FEN/GI:  Soft diet due to poor dentition Prophylaxis: Lovenox  Disposition: Medically stable for discharge, awaiting placement to SNF  Subjective:   Lying in bed.  Awake.  Has no complaints. objective: Temp:  [97.5 F (36.4 C)-100.2 F (37.9 C)] 99.9 F (37.7 C) (02/22 0544) Pulse Rate:  [65-77] 74 (02/22 0519) Resp:  [16-18] 16 (02/22 0519) BP: (91-107)/(52-63) 106/57 (02/22 0519) SpO2:  [95 %-100 %] 95 % (02/22 0519) Physical Exam: General: Lying in bed, awake and alert,  Cardiovascular: RRR, no MRG Respiratory: CTAB, normal work of breathing Abdomen: soft nontender nondistended; no purulent discharge today    Neuro: Alert and oriented,  moving extremities spontaneously  Psych: Slow to verbalize answers however they seem appropriate   Bonnita Hollow, MD 11/03/2017, 6:47 AM PGY-1, [place] Intern pager: [phone], text pages welcome

## 2017-11-03 NOTE — Progress Notes (Signed)
CM following for progression of care, Percell Locus SW following case for transition to SNF; B [employer] [phone]

## 2017-11-04 DIAGNOSIS — F259 Schizoaffective disorder, unspecified: Secondary | ICD-10-CM

## 2017-11-04 LAB — BASIC METABOLIC PANEL
Anion gap: 13 (ref 5–15)
BUN: 8 mg/dL (ref 6–20)
CO2: 26 mmol/L (ref 22–32)
CREATININE: 0.5 mg/dL — AB (ref 0.61–1.24)
Calcium: 8.9 mg/dL (ref 8.9–10.3)
Chloride: 98 mmol/L — ABNORMAL LOW (ref 101–111)
GFR calc non Af Amer: >60 mL/min (ref >60)
GLUCOSE: 112 mg/dL — AB (ref 65–99)
Potassium: 4.2 mmol/L (ref 3.5–5.1)
Sodium: 137 mmol/L (ref 135–145)

## 2017-11-04 LAB — CBC
HEMATOCRIT: 31.4 % — AB (ref 39.0–52.0)
HEMOGLOBIN: 10.2 g/dL — AB (ref 13.0–17.0)
MCH: 31.1 pg (ref 26.0–34.0)
MCHC: 32.5 g/dL (ref 30.0–36.0)
MCV: 95.7 fL (ref 78.0–100.0)
Platelets: 237 10*3/uL (ref 150–400)
RBC: 3.28 MIL/uL — ABNORMAL LOW (ref 4.22–5.81)
RDW: 16 % — ABNORMAL HIGH (ref 11.5–15.5)
WBC: 6.5 10*3/uL (ref 4.0–10.5)

## 2017-11-04 LAB — VALPROIC ACID LEVEL: Valproic Acid Lvl: 61 ug/mL (ref 50.0–100.0)

## 2017-11-04 LAB — CREATININE, SERUM
Creatinine, Ser: 0.53 mg/dL — ABNORMAL LOW (ref 0.61–1.24)
GFR calc Af Amer: >60 mL/min (ref >60)
GFR calc non Af Amer: >60 mL/min (ref >60)

## 2017-11-04 MED ORDER — LORAZEPAM 0.5 MG PO TABS
0.5000 mg | ORAL_TABLET | Freq: Three times a day (TID) | ORAL | Status: DC
  Administered 2017-11-04 – 2017-11-06 (×7): 0.5 mg via ORAL
  Filled 2017-11-04 (×7): qty 1

## 2017-11-04 MED ORDER — SODIUM CHLORIDE 0.9 % IV BOLUS (SEPSIS)
500.0000 mL | Freq: Once | INTRAVENOUS | Status: AC
  Administered 2017-11-04: 500 mL via INTRAVENOUS

## 2017-11-04 NOTE — Progress Notes (Addendum)
Family Medicine Teaching Service Daily Progress Note Intern Pager: [phone]  Patient name: Franklin Ayers Medical record number: [id number] Date of birth: December 09, 1960 Age: 58 y.o. Gender: male  Primary Care Provider: Barrie Lyme, FNP Consultants: Psychiatry Code Status: Full  Assessment and Plan: Franklin Ayers is a 57 y.o. male presenting with altered mental status and a UTI. PMH is significant for schizoaffective disorder.  Catatonia  Acute psychosis  Schizoaffective disorder -Medically stable for discharge -Ativan 1 mg q8hr (wonder if we need to decrease this dose?) -Psychiatry has cleared patient for discharge to SNF given improvement. -Depakote 500mg  in the AM and 1000mg  at 8 PM  -Seroquel 300mg  at 8 PM -Will monitor mental status closely  -BMP q3d to maintain inpatient psych necessity  -Tylenol PRN  Low/Normal Blood Pressures: unclear in etiology. In prior ED visits/outpatient visits, his blood pressure is usually above the low 100s. He has been afebrile and without leukocytosis. Normal HR. Less likely due to an infection. Possibly medication related. He has been having good urine output but mucous membranes appear slightly dry.  - consider decreasing Ativan dose - consider valproic acid level - 500cc bolus x 1.   Left arm pain, resolved No complaints today. IV site looks unremarkable - monitor   Acute urinary retention secondary to BPH, resolved Patient is voiding normally without retention.   -Tamsulosin 0.8 mg qd -Strict I/Os - will discuss need for continued I/Os with SW   Soft tissue infection with purulent drainage, resolved:  Wound cultures shows abundant Corynebacterium that has been appropriately treated with doxy 100mg  BID x5d per ID.  -Continue Bactroban 2% cream to area, daily clean wound with clean room and apply adhesive bandage  Moderate malnutrition due to chronic illness  -Nutrition consult appreciated -Assist with feeds -Nutritional  supplement   Social: Mother concerned about her ability to take of patient after discharge and feels that assisted living facility will be best for patient.  -Awaiting geri unit psych placement  FEN/GI:  Soft diet due to poor dentition Prophylaxis: Lovenox  Disposition: Medically stable for discharge, awaiting placement to SNF  Subjective:  Lying in bed.  Awake. Would only answer certain questions. Would not state name, where he is, and the year. Reports he is tired. Did not get a good night's rest.   Objective: Temp:  [97.9 F (36.6 C)-98.6 F (37 C)] 98.6 F (37 C) (02/23 0616) Pulse Rate:  [69-72] 69 (02/23 0617) Resp:  [16-18] 17 (02/23 0616) BP: (90-98)/(49-56) 95/49 (02/23 0616) SpO2:  [94 %-98 %] 97 % (02/23 0617) Physical Exam: General: Lying in bed, awake HEENT: PERRL, opens eyes spontaneously. Mildly dry mucous membranes.  Cardiovascular: RRR, no MRG Respiratory: CTAB, normal work of breathing Abdomen: soft nontender nondistended; no purulent discharge today    Neuro: Alert and oriented,  moving extremities spontaneously  Psych: Slow to verbalize answers however they seem appropriate   Franklin Houseman, MD 11/04/2017, 8:21 AM PGY-3, [place] Intern pager: [phone], text pages welcome

## 2017-11-05 MED ORDER — DIVALPROEX SODIUM ER 500 MG PO TB24
500.0000 mg | ORAL_TABLET | Freq: Every day | ORAL | Status: DC
  Administered 2017-11-06: 500 mg via ORAL
  Filled 2017-11-05: qty 1

## 2017-11-05 MED ORDER — DIVALPROEX SODIUM ER 500 MG PO TB24: 1000.0000 mg | ORAL_TABLET | Freq: Every day | ORAL | Status: DC

## 2017-11-05 MED ORDER — QUETIAPINE FUMARATE ER 300 MG PO TB24
300.0000 mg | ORAL_TABLET | Freq: Every day | ORAL | Status: DC
  Administered 2017-11-05 – 2017-11-06 (×2): 300 mg via ORAL
  Filled 2017-11-05 (×2): qty 1

## 2017-11-05 NOTE — Progress Notes (Signed)
Family Medicine Teaching Service Daily Progress Note Intern Pager: [phone]  Patient name: Franklin Ayers Medical record number: [id number] Date of birth: 06/12/1961 Age: 57 y.o. Gender: male  Primary Care Provider: Barrie Lyme, FNP Consultants: Psychiatry Code Status: Full  Assessment and Plan: Gunnison Chahal is a 57 y.o. male presenting with altered mental status and a UTI. PMH is significant for schizoaffective disorder.  Catatonia  Acute psychosis  Schizoaffective disorder -Medically stable for discharge -Ativan 0.5 mg q8hr  -Psychiatry has cleared patient for discharge to SNF given improvement. -Depakote 500mg  in the AM and 1000mg  at 8 PM  -Seroquel 300mg  at 8 PM -Will monitor mental status closely  -BMP q3d to maintain inpatient psych necessity  -Tylenol PRN  Low/Normal Blood Pressures: Stable.   -  Continue to monitor  Acute urinary retention secondary to BPH, resolved Patient is voiding normally without retention.   -Tamsulosin 0.8 mg qd  Soft tissue infection with purulent drainage, resolved:  Wound cultures shows abundant Corynebacterium that has been appropriately treated with doxy 100mg  BID x5d per ID.  -Continue Bactroban 2% cream to area, daily clean wound with clean room and apply adhesive bandage  Moderate malnutrition due to chronic illness  -Nutrition consult appreciated -Assist with feeds -Nutritional supplement   Social: Mother concerned about her ability to take of patient after discharge and feels that assisted living facility will be best for patient.  -Awaiting geri unit psych placement  FEN/GI:  Soft diet due to poor dentition Prophylaxis: Lovenox  Disposition: Medically stable for discharge, awaiting placement to SNF  Subjective:  Sitting up more alert in bed this morning. Many patients examining his many this morning. Denies any other issues.  Objective: Temp:  [97.7 F (36.5 C)-98.1 F (36.7 C)] 98.1 F (36.7 C) (02/24  0541) Pulse Rate:  [64-72] 72 (02/24 0541) Resp:  [12-18] 12 (02/24 0541) BP: (93-105)/(53-63) 93/53 (02/24 0541) SpO2:  [94 %-98 %] 94 % (02/24 0541) Physical Exam: General: Lying in bed, awake With glasses on Cardiovascular: RRR, no MRG Respiratory: CTAB, normal work of breathing Abdomen: soft nontender nondistended; Neuro: Alert and oriented,  moving extremities spontaneously  Psych: Slow to verbalize answers however they seem appropriate   Bonnita Hollow, MD 11/05/2017, 11:10 AM PGY-1, [place] Intern pager: [phone], text pages welcome

## 2017-11-06 DIAGNOSIS — F250 Schizoaffective disorder, bipolar type: Secondary | ICD-10-CM

## 2017-11-06 MED ORDER — LORAZEPAM 0.5 MG PO TABS: 0.5000 mg | ORAL_TABLET | Freq: Three times a day (TID) | ORAL | 0 refills | Status: DC

## 2017-11-06 MED ORDER — DIVALPROEX SODIUM ER 500 MG PO TB24: 1000.0000 mg | ORAL_TABLET | Freq: Every day | ORAL | Status: DC

## 2017-11-06 MED ORDER — MUPIROCIN CALCIUM 2 % EX CREA: TOPICAL_CREAM | Freq: Every day | CUTANEOUS | 0 refills | Status: DC

## 2017-11-06 MED ORDER — DIVALPROEX SODIUM ER 500 MG PO TB24: 500.0000 mg | ORAL_TABLET | Freq: Every day | ORAL | Status: DC

## 2017-11-06 MED ORDER — QUETIAPINE FUMARATE ER 300 MG PO TB24: 300.0000 mg | ORAL_TABLET | Freq: Every day | ORAL | Status: DC

## 2017-11-06 MED ORDER — ENSURE ENLIVE PO LIQD: 237.0000 mL | Freq: Two times a day (BID) | ORAL | 12 refills | Status: DC

## 2017-11-06 MED ORDER — TAMSULOSIN HCL 0.4 MG PO CAPS: 0.8000 mg | ORAL_CAPSULE | Freq: Every day | ORAL | Status: DC

## 2017-11-06 NOTE — Progress Notes (Signed)
Pasrr received: [id number] F. Patient able to discharge today to [facility].   Percell Locus Ryana Montecalvo LCSW [phone]

## 2017-11-06 NOTE — Clinical Social Work Placement (Signed)
   CLINICAL SOCIAL WORK PLACEMENT  NOTE  Date:  11/06/2017  Patient Details  Name: Franklin Ayers MRN: [id number] Date of Birth: 04/24/1961  Clinical Social Work is seeking post-discharge placement for this patient at the [place] level of care (*CSW will initial, date and re-position this form in  chart as items are completed):  Yes   Patient/family provided with [place] Work Department's list of facilities offering this level of care within the geographic area requested by the patient (or if unable, by the patient's family).  Yes   Patient/family informed of their freedom to choose among providers that offer the needed level of care, that participate in Medicare, Medicaid or managed care program needed by the patient, have an available bed and are willing to accept the patient.  Yes   Patient/family informed of [facility]'s ownership interest in [facility] and [facility], as well as of the fact that they are under no obligation to receive care at these facilities.  PASRR submitted to EDS on 11/03/17     PASRR number received on 11/06/17     Existing PASRR number confirmed on       FL2 transmitted to all facilities in geographic area requested by pt/family on 10/31/17     FL2 transmitted to all facilities within larger geographic area on       Patient informed that his/her managed care company has contracts with or will negotiate with certain facilities, including the following:        Yes   Patient/family informed of bed offers received.  Patient chooses bed at [facility]     Physician recommends and patient chooses bed at      Patient to be transferred to [facility] on 11/06/17.  Patient to be transferred to facility by PTAR     Patient family notified on 11/06/17 of transfer.  Name of family member notified:  Mother, Monique     PHYSICIAN Please prepare priority discharge summary,  including medications     Additional Comment:    _______________________________________________ Benard Halsted, LCSWA 11/06/2017, 12:07 PM

## 2017-11-06 NOTE — Progress Notes (Signed)
PTAR arrived to discharge patient.

## 2017-11-06 NOTE — Progress Notes (Signed)
Family Medicine Teaching Service Daily Progress Note Intern Pager: [phone]  Patient name: Franklin Ayers Medical record number: [id number] Date of birth: 31-Mar-1961 Age: 57 y.o. Gender: male  Primary Care Provider: Barrie Lyme, FNP Consultants: Psychiatry Code Status: Full  Assessment and Plan: Franklin Ayers is a 57 y.o. male presenting with altered mental status and a UTI. PMH is significant for schizoaffective disorder.  Catatonia  Acute psychosis  Schizoaffective disorder -Medically stable for discharge -Ativan 0.5 mg q8hr  -Psychiatry has cleared patient for discharge to SNF given improvement. -Depakote 500mg  in the AM and 1000mg  at 8 PM  -Changed to Seroquel 300mg  XR at 2 PM -Will monitor mental status closely  -BMP q3d to maintain inpatient psych necessity  -Tylenol PRN  Low/Normal Blood Pressures: Stable.   -  Continue to monitor  Acute urinary retention secondary to BPH, resolved Patient is voiding normally without retention.   -Tamsulosin 0.8 mg qd  Soft tissue infection with purulent drainage, resolved:  Wound cultures shows abundant Corynebacterium that has been appropriately treated with doxy 100mg  BID x5d per ID.  -Continue Bactroban 2% cream to area, daily clean wound with clean room and apply adhesive bandage  Moderate malnutrition due to chronic illness  -Nutrition consult appreciated -Assist with feeds -Nutritional supplement   Social: Mother concerned about her ability to take of patient after discharge and feels that assisted living facility will be best for patient.  -Awaiting geri unit psych placement  FEN/GI:  Soft diet due to poor dentition Prophylaxis: Lovenox  Disposition: Medically stable for discharge, planned discharge today  Subjective:  Sitting up more alert in bed this morning. Many patients examining his many this morning. Denies any other issues.  Objective: Temp:  [97.6 F (36.4 C)-98.2 F (36.8 C)] 97.6 F (36.4 C)  (02/25 0548) Pulse Rate:  [64-69] 64 (02/25 0548) Resp:  [15-18] 17 (02/25 0548) BP: (92-99)/(56-59) 98/56 (02/25 0548) SpO2:  [96 %-98 %] 98 % (02/25 0548) Physical Exam: General: Lying in bed, awake With glasses on Cardiovascular: RRR, no MRG Respiratory: CTAB, normal work of breathing Abdomen: soft nontender nondistended; Neuro: Alert and oriented,  moving extremities spontaneously  Psych: Slow to verbalize answers however they seem appropriate   Bonnita Hollow, MD 11/06/2017, 9:24 AM PGY-1, [place] Intern pager: [phone], text pages welcome

## 2017-11-06 NOTE — Progress Notes (Signed)
Physical Therapy Treatment Patient Details Name: Franklin Ayers MRN: [id number] DOB: Jul 18, 1961 Today's Date: 11/06/2017    History of Present Illness Franklin Ayers a 57 y.o.malepresenting with altered mental status and a UTI. PMH is significant forschizoaffective disorder. Unsure of compliance with medications.    PT Comments    Pt received in bed. Pleasant and agreeable to participation in therapy. Pt ambulated 250 feet with RW and min guard assist. He was positioned reclined in bedside chair at end of session with chair alarm. Goals re-assessed and updated.    Follow Up Recommendations  SNF     Equipment Recommendations  Rolling walker with 5" wheels    Recommendations for Other Services       Precautions / Restrictions Precautions Precautions: Fall Restrictions Weight Bearing Restrictions: No    Mobility  Bed Mobility Overal bed mobility: Modified Independent       Supine to sit: Modified independent (Device/Increase time);HOB elevated Sit to supine: Modified independent (Device/Increase time);HOB elevated   General bed mobility comments: Increased time, no physical assist  Transfers Overall transfer level: Needs assistance Equipment used: Rolling walker (2 wheeled) Transfers: Sit to/from Stand Sit to Stand: Supervision         General transfer comment: Supervision for safety, verbal cues for hand placement  Ambulation/Gait Ambulation/Gait assistance: Min guard Ambulation Distance (Feet): 250 Feet Assistive device: Rolling walker (2 wheeled) Gait Pattern/deviations: Step-through pattern;Decreased stride length;Drifts right/left Gait velocity: decreased Gait velocity interpretation: Below normal speed for age/gender General Gait Details: Pt walking on his heels. Difficulty maneuvering around obstacles.    Stairs            Wheelchair Mobility    Modified Rankin (Stroke Patients Only)       Balance Overall balance assessment:  Needs assistance Sitting-balance support: Feet supported;No upper extremity supported Sitting balance-Leahy Scale: Good     Standing balance support: During functional activity;Bilateral upper extremity supported Standing balance-Leahy Scale: Poor Standing balance comment: RW and min guard for gait                            Cognition Arousal/Alertness: Awake/alert Behavior During Therapy: WFL for tasks assessed/performed Overall Cognitive Status: No family/caregiver present to determine baseline cognitive functioning                                 General Comments: History of schizophrenia. Responding appropriately but slowed.      Exercises      General Comments        Pertinent Vitals/Pain Pain Assessment: No/denies pain    Home Living                      Prior Function            PT Goals (current goals can now be found in the care plan section) Acute Rehab PT Goals Patient Stated Goal: didn't state PT Goal Formulation: Patient unable to participate in goal setting Time For Goal Achievement: 11/13/17 Potential to Achieve Goals: Fair Progress towards PT goals: Progressing toward goals    Frequency    Min 2X/week      PT Plan Current plan remains appropriate    Co-evaluation              AM-PAC PT "6 Clicks" Daily Activity  Outcome Measure  Difficulty turning over in bed (including adjusting bedclothes,  sheets and blankets)?: None Difficulty moving from lying on back to sitting on the side of the bed? : None Difficulty sitting down on and standing up from a chair with arms (e.g., wheelchair, bedside commode, etc,.)?: A Little Help needed moving to and from a bed to chair (including a wheelchair)?: A Little Help needed walking in hospital room?: A Little Help needed climbing 3-5 steps with a railing? : A Little 6 Click Score: 20    End of Session Equipment Utilized During Treatment: Gait belt Activity  Tolerance: Patient tolerated treatment well Patient left: with call bell/phone within reach;in chair;with chair alarm set Nurse Communication: Mobility status PT Visit Diagnosis: Unsteadiness on feet (R26.81);History of falling (Z91.81)     Time: 6629-4765 PT Time Calculation (min) (ACUTE ONLY): 17 min  Charges:  $Gait Training: 8-22 mins                    G Codes:       Lorrin Goodell, PT  Office # [phone] Pager [phone]    Lorriane Shire 11/06/2017, 9:49 AM

## 2017-11-06 NOTE — Progress Notes (Signed)
Patient still waiting for PTAR. 

## 2017-11-06 NOTE — Progress Notes (Signed)
Patient will DC to: [facility]) Anticipated DC date: 11/06/17 Family notified: Mother Transport by: Corey Harold 4pm  Please send signed scripts with patient.   Per MD patient ready for DC to [facility]. RN, patient, patient's family, and facility notified of DC. Discharge Summary sent to facility. RN given number for report. DC packet on chart. Ambulance transport requested for patient.   CSW signing off.  Cedric Fishman, LCSW Clinical Social Worker [phone]

## 2017-11-06 NOTE — Clinical Social Work Note (Signed)
Clinical Social Work Assessment  Patient Details  Name: Franklin Ayers MRN: [id number] Date of Birth: 12-15-1960  Date of referral:  11/06/17               Reason for consult:  Facility Placement                Permission sought to share information with:  Facility Sport and exercise psychologist, Family Supports Permission granted to share information::  Yes, Verbal Permission Granted  Name::     Forensic psychologist::  SNFs  Relationship::  Mother  Contact Information:  [phone]  Housing/Transportation Living arrangements for the past 2 months:  [place] of Information:  Patient, Parent Patient Interpreter Needed:  None Criminal Activity/Legal Involvement Pertinent to Current Situation/Hospitalization:  No - Comment as needed Significant Relationships:  Parents Lives with:  Parents Do you feel safe going back to the place where you live?  No Need for family participation in patient care:  Yes (Comment)  Care giving concerns:  CSW received consult for possible SNF placement at time of discharge. CSW spoke with patient's mother regarding PT recommendation of SNF placement at time of discharge. Patient's mother reported that she is 81 and unable to take care of patient. Patient expressed understanding of PT recommendation and is agreeable to SNF placement at time of discharge. CSW to continue to follow and assist with discharge planning needs.   Social Worker assessment / plan:  CSW spoke with patient and his mother concerning possibility of rehab at [facility] before returning home.  Employment status:  Disabled (Comment on whether or not currently receiving Disability) Insurance information:  Medicare, Medicaid In [place] PT Recommendations:  Long / Referral to community resources:  [place]  Patient/Family's Response to care:  Patient recognizes need for rehab. Patient's mother requests [facility] because it is close to her home and  she has done rehab there before. She understands that she will have to work with their team to determine long term placement.   Patient/Family's Understanding of and Emotional Response to Diagnosis, Current Treatment, and Prognosis:  Patient/family is realistic regarding therapy needs and expressed being hopeful for SNF placement. Patient's mother expressed understanding of CSW role and discharge process as well as medical condition. No questions/concerns about plan or treatment.    Emotional Assessment Appearance:  Appears stated age Attitude/Demeanor/Rapport:  Other(Quiet) Affect (typically observed):  Accepting, Appropriate Orientation:  Oriented to Self, Oriented to Place Alcohol / Substance use:  Not Applicable Psych involvement (Current and /or in the community):  Yes (Comment)(Cleared by psych)  Discharge Needs  Concerns to be addressed:    Readmission within the last 30 days:  No Current discharge risk:  None Barriers to Discharge:  No Barriers Identified   Benard Halsted, [place] 11/06/2017, 12:10 PM

## 2017-11-06 NOTE — Progress Notes (Signed)
Patient was discharged nursing home ([facility]) by MD order; discharged instructions  review and sent to facility with care notes and prescription; IV DIC; facility was called and report was given to the nurse who is going to receive the patient. Patient will be transported to facility via [place].

## 2017-11-06 NOTE — Progress Notes (Addendum)
Nutrition Follow-up  DOCUMENTATION CODES:   Non-severe (moderate) malnutrition in context of chronic illness  INTERVENTION:  Continue Ensure Enlive po BID, each supplement provides 350 kcal and 20 grams of protein  Continue Magic cup TID with meals, each supplement provides 290 kcal and 9 grams of protein   NUTRITION DIAGNOSIS:   Moderate Malnutrition related to chronic illness as evidenced by moderate muscle depletion, mild fat depletion, moderate fat depletion, mild muscle depletion, percent weight loss. -ongoing  GOAL:   Patient will meet greater than or equal to 90% of their needs -progressing  MONITOR:   PO intake, I & O's, Labs, Supplement acceptance, Weight trends  ASSESSMENT:   57 yo male with PMH schizoaffective disorder, presents with AMS and UTI  Now cleared to go to SNF due to mentation improvement, otherwise clinically unchanged, stable. PO avg 43% still, fluctuates from day to day Consuming Ensure  Labs reviewed:  Colace, Miralax, Multivitamin,  Medications reviewed  Diet Order:  DIET SOFT Room service appropriate? Yes; Fluid consistency: Thin  EDUCATION NEEDS:   Not appropriate for education at this time  Skin:  Skin Assessment: Reviewed RN Assessment  Last BM:  10/31/2017  Height:   Ht Readings from Last 1 Encounters:  10/20/17 5\' 10"  (1.778 m)    Weight:   Wt Readings from Last 1 Encounters:  10/30/17 134 lb 0.6 oz (60.8 kg)    Ideal Body Weight:  70 kg  BMI:  Body mass index is 19.23 kg/m.  Estimated Nutritional Needs:   Kcal:  4268-3419 calories  Protein:  89-102 grams  Fluid:  1.6-1.9L  Satira Anis. Jaysion Ramseyer, MS, RD LDN Inpatient Clinical Dietitian Pager [phone]

## 2017-11-07 ENCOUNTER — Encounter: Payer: Self-pay | Admitting: Adult Health

## 2017-11-07 ENCOUNTER — Non-Acute Institutional Stay (SKILLED_NURSING_FACILITY): Payer: Medicare Other | Admitting: Adult Health

## 2017-11-07 DIAGNOSIS — N390 Urinary tract infection, site not specified: Secondary | ICD-10-CM

## 2017-11-07 DIAGNOSIS — R338 Other retention of urine: Secondary | ICD-10-CM

## 2017-11-07 DIAGNOSIS — N401 Enlarged prostate with lower urinary tract symptoms: Secondary | ICD-10-CM

## 2017-11-07 DIAGNOSIS — F202 Catatonic schizophrenia: Secondary | ICD-10-CM

## 2017-11-07 DIAGNOSIS — D649 Anemia, unspecified: Secondary | ICD-10-CM

## 2017-11-07 DIAGNOSIS — E440 Moderate protein-calorie malnutrition: Secondary | ICD-10-CM | POA: Diagnosis not present

## 2017-11-07 DIAGNOSIS — F250 Schizoaffective disorder, bipolar type: Secondary | ICD-10-CM

## 2017-11-07 DIAGNOSIS — R319 Hematuria, unspecified: Secondary | ICD-10-CM

## 2017-11-07 DIAGNOSIS — L089 Local infection of the skin and subcutaneous tissue, unspecified: Secondary | ICD-10-CM | POA: Diagnosis not present

## 2017-11-07 NOTE — Progress Notes (Addendum)
Location:   [place] Room Number: 121 Place of Service:  SNF (31)   CODE STATUS: full code   Allergies  Allergen Reactions  . Penicillins Shortness Of Breath    .Has patient had a PCN reaction causing immediate rash, facial/tongue/throat swelling, SOB or lightheadedness with hypotension: no Has patient had a PCN reaction causing severe rash involving mucus membranes or skin necrosis: yes Has patient had a PCN reaction that required hospitalization: no Has patient had a PCN reaction occurring within the last 10 years: no If all of the above answers are "NO", then may proceed with Cephalosporin use.     Chief Complaint  Patient presents with  . Hospitalization Follow-up    HPI:  He is a 57 year old man who has a long term history of schizophrenia. 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 He had been in a group home for about ten years; but had moved back with his mother for the past several years. She is no longer able to care for him at home. This does represent a long term placement for him either in assisted living or SNF. He is unable unable to participate in the hpi or ros.  He had been admitted to the hospital for altered mental status. His mother found him outside on the concrete. He had defecated in the kitchen during the night. He was treated for an uti and had a skin infection in his naval. He had not been taking his medications properly at home. He will be followed for his chronic illnesses while he is here including: BPH; schizophrenia; and weight loss. There are no reports of behavior issues; change in appetite; no insomnia last night. There are no nursing concerns today.    Past Medical History:  Diagnosis Date  . Manic depression ([place])   . Mental disorder   . Schizoaffective disorder [facility])     Past Surgical History:  Procedure Laterality Date  . TONSILLECTOMY      Social History   Socioeconomic  History  . Marital status: Single    Spouse name: Not on file  . Number of children: Not on file  . Years of education: Not on file  . Highest education level: Not on file  Social Needs  . Financial resource strain: Not on file  . Food insecurity - worry: Not on file  . Food insecurity - inability: Not on file  . Transportation needs - medical: Not on file  . Transportation needs - non-medical: Not on file  Occupational History  . Not on file  Tobacco Use  . Smoking status: Former Smoker    Packs/day: 2.00    Types: Cigarettes  . Smokeless tobacco: Never Used  . Tobacco comment: refused  Substance and Sexual Activity  . Alcohol use: No    Frequency: Never    Comment: occasional  . Drug use: No  . Sexual activity: No  Other Topics Concern  . Not on file  Social History Narrative  . Not on file   Family History  Problem Relation Age of Onset  . Cancer Other   . Stroke Other       VITAL SIGNS BP (!) 115/96   Pulse 95   Temp 98.1 F (36.7 C)   Resp 16   Ht 5\' 10"  (1.778 m)   Wt 134 lb (60.8 kg) Comment: weight from [place]  SpO2 98%   BMI 19.23  kg/m    Outpatient Encounter Medications as of 11/07/2017  Medication Sig  . divalproex (DEPAKOTE ER) 500 MG 24 hr tablet Take 1 tablet (500 mg total) by mouth daily with breakfast.  . divalproex (DEPAKOTE ER) 500 MG 24 hr tablet Take 2 tablets (1,000 mg total) by mouth daily with supper.  Marland Kitchen LORazepam (ATIVAN) 0.5 MG tablet Take 1 tablet (0.5 mg total) by mouth every 8 (eight) hours.  . Multiple Vitamin (MULTIVITAMIN WITH MINERALS) TABS tablet Take 1 tablet by mouth daily.  . mupirocin cream (BACTROBAN) 2 % Apply topically daily. Apply until resolution of peri-naval wound  . QUEtiapine (SEROQUEL XR) 300 MG 24 hr tablet Take 1 tablet (300 mg total) by mouth daily with lunch.  . tamsulosin (FLOMAX) 0.4 MG CAPS capsule Take 2 capsules (0.8 mg total) by mouth daily.  . [DISCONTINUED] feeding supplement, ENSURE ENLIVE,  (ENSURE ENLIVE) LIQD Take 237 mLs by mouth 2 (two) times daily between meals.   No facility-administered encounter medications on file as of 11/07/2017.      SIGNIFICANT DIAGNOSTIC EXAMS   TODAY:   10-14-17: ct of head and cervical spine: 1. No acute intracranial pathology. 2. No acute osseous injury of the cervical spine. 3. Mild cervical spine spondylosis as described above.  10-16-17: renal ultrasound: Debris within urinary bladder.   No obstructive uropathy. 9 mm RIGHT lower pole artifact, less likely vascular calcification or nonobstructing nephrolithiasis.   10-21-17: ct of abdomen and pelvis: Mild prostatic enlargement.   No acute intra-abdominal or intrapelvic abnormalities.   Aortic Atherosclerosis (ICD10-I70.0).  10-22-17: ct of head: Negative head CT.  LABS REVIEWED TODAY:   10-14-17: wbc 9.1; hgb 13.7; hct 40.2; mcv 89.9 plt 221; glucose 102; bun 13; creat 0.64; k+ 3.5; na++ 143; ca 9.4; liver normal albumin 3.6 Ammonia 17; vit B 12: 317; tsh 0.833; mag 2.0; depakote <10; HIV: nr; RPR: nr; urine drug screen: neg urine culture: staphylococcus lugdunensis  10-19-17: wbc 5.7; hgb 11.5; hct 34.6; mcv 91.3 plt 159; glucose 78; bun 13; creat 0.57; k+ 3.7; na++ 140; ca 8.7; phos 2.9 10-22-17: depakote 87 11-04-17: depakote 61   Review of Systems  Unable to perform ROS: Psychiatric disorder (schizophrenia )    Physical Exam  Constitutional: No distress.  Frail   HENT:  Mouth/Throat: Oropharynx is clear and moist.  Eyes: Conjunctivae are normal.  Neck: Neck supple. No thyromegaly present.  Cardiovascular: Normal rate, regular rhythm and intact distal pulses.  Pulmonary/Chest: Effort normal and breath sounds normal. No stridor. No respiratory distress.  Abdominal: Soft. Bowel sounds are normal. He exhibits no distension. There is no tenderness.  Musculoskeletal: He exhibits no edema.  Is able to move all extremities Does have spastic movements of lower extremities in bed Has  finger rolling movement of both hands No abnormal facial movements   Lymphadenopathy:    He has no cervical adenopathy.  Neurological: He is alert.  Skin: Skin is warm and dry. He is not diaphoretic.  Dressing dry and intact to naval   Psychiatric:  Unable to participate in the hpi or ros;  Is calm       ASSESSMENT/ PLAN:  TODAY:   1. Acute urinary retention with BPH: stable; did require foley in the hospital which has since been removed; will continue flomax 0.8 mg daily and will monitor   2. Anemia: stable hgb 11.5 will monitor   3. Malnutrition of moderate degree: albumin 3.6; will continue supplements as directed will monitor   4.  Schizoaffective disorder (bipolar); catatonia schizophrenia: without change in status; will continue seroquel xr 300 mg daily and depakote er 500 mg in the AM and 1 gm in the PM. Does take ativen 0.5 mg three times daily   5. Soft tissue skin infection: has complete po abt therapy, doxycycline, will continue bactroban to [employer] area and will monitor   6. Urinary tract infection with hematuria: with staphylococcus lugdunensis has completed abt (septra) and will monitor his status.   Time spent with patient and mother: 45 minutes: reviewing medical record; discussing status with his mother; goals of care. Verbalized understanding.   Due to his infection of staphylococcus lugdunensis will setup a 2-d echo to look for vegetation.   MD is aware of resident's narcotic use and is in agreement with current plan of care. We will attempt to wean resident as apropriate   Ok Edwards NP [facility]  Contact [phone] Monday through Friday 8am- 5pm  After hours call [phone]

## 2017-11-08 ENCOUNTER — Encounter: Payer: Self-pay | Admitting: Adult Health

## 2017-11-08 ENCOUNTER — Non-Acute Institutional Stay (SKILLED_NURSING_FACILITY): Payer: Medicare Other | Admitting: Adult Health

## 2017-11-08 DIAGNOSIS — F250 Schizoaffective disorder, bipolar type: Secondary | ICD-10-CM | POA: Diagnosis not present

## 2017-11-08 DIAGNOSIS — E440 Moderate protein-calorie malnutrition: Secondary | ICD-10-CM

## 2017-11-08 DIAGNOSIS — R5381 Other malaise: Secondary | ICD-10-CM | POA: Diagnosis not present

## 2017-11-08 DIAGNOSIS — F202 Catatonic schizophrenia: Secondary | ICD-10-CM

## 2017-11-08 NOTE — Progress Notes (Signed)
Location:   [place] Room Number: 121 Place of Service:  SNF (31)   CODE STATUS: full code (MOST done 11-08-17)  Allergies  Allergen Reactions  . Penicillins Shortness Of Breath    .Has patient had a PCN reaction causing immediate rash, facial/tongue/throat swelling, SOB or lightheadedness with hypotension: no Has patient had a PCN reaction causing severe rash involving mucus membranes or skin necrosis: yes Has patient had a PCN reaction that required hospitalization: no Has patient had a PCN reaction occurring within the last 10 years: no If all of the above answers are "NO", then may proceed with Cephalosporin use.     Chief Complaint  Patient presents with  . Acute Visit    care plan meeeting     HPI:  We have come together with the care plan team and his family his mother and sister Thersa Salt) are present. He is unable to fully participate with the meeting. Speech therapy has recommended him seeing a dentist in order to get his partial plate replaced. He became irritated with this idea and does not want to return back to the dentist. He is participating with therapy; however; reluctantly.  The goal of his care is for him to go to assisted living; his mother is no longer able to manage his care at home. His family has expressed concerned about his emotional and mental health. He is talking to himself more; is having audible hallucinations. He is more agitated.  He has lost nearly 50 pounds over the past several months, his sister thinks from Sept 2018. He has become less functional at home and was not taking his medications. His feels as though he has given up and she would like his emotional health addressed.  She and I did discuss his advanced directives and have filled out his MOST form.    Past Medical History:  Diagnosis Date  . Manic depression ([place])   . Mental disorder   . Schizoaffective disorder [facility])     Past Surgical History:  Procedure  Laterality Date  . TONSILLECTOMY      Social History   Socioeconomic History  . Marital status: Single    Spouse name: Not on file  . Number of children: Not on file  . Years of education: Not on file  . Highest education level: Not on file  Social Needs  . Financial resource strain: Not on file  . Food insecurity - worry: Not on file  . Food insecurity - inability: Not on file  . Transportation needs - medical: Not on file  . Transportation needs - non-medical: Not on file  Occupational History  . Not on file  Tobacco Use  . Smoking status: Former Smoker    Packs/day: 2.00    Types: Cigarettes  . Smokeless tobacco: Never Used  . Tobacco comment: refused  Substance and Sexual Activity  . Alcohol use: No    Frequency: Never    Comment: occasional  . Drug use: No  . Sexual activity: No  Other Topics Concern  . Not on file  Social History Narrative  . Not on file   Family History  Problem Relation Age of Onset  . Cancer Other   . Stroke Other       VITAL SIGNS BP 115/78   Pulse 88   Temp 98.1 F (36.7 C)   Resp 18   Ht 5\' 10"  (1.778 m)   Wt 134 lb (60.8 kg)   SpO2  98%   BMI 19.23 kg/m    Outpatient Encounter Medications as of 11/08/2017  Medication Sig  . divalproex (DEPAKOTE ER) 500 MG 24 hr tablet Take 1 tablet (500 mg total) by mouth daily with breakfast.  . divalproex (DEPAKOTE ER) 500 MG 24 hr tablet Take 2 tablets (1,000 mg total) by mouth daily with supper.  Marland Kitchen LORazepam (ATIVAN) 0.5 MG tablet Take 1 tablet (0.5 mg total) by mouth every 8 (eight) hours.  . Multiple Vitamin (MULTIVITAMIN WITH MINERALS) TABS tablet Take 1 tablet by mouth daily.  . mupirocin cream (BACTROBAN) 2 % Apply topically daily. Apply until resolution of peri-naval wound  . QUEtiapine (SEROQUEL XR) 300 MG 24 hr tablet Take 1 tablet (300 mg total) by mouth daily with lunch.  . tamsulosin (FLOMAX) 0.4 MG CAPS capsule Take 2 capsules (0.8 mg total) by mouth daily.   No  facility-administered encounter medications on file as of 11/08/2017.      SIGNIFICANT DIAGNOSTIC EXAMS  PREVIOUS:   10-14-17: ct of head and cervical spine: 1. No acute intracranial pathology. 2. No acute osseous injury of the cervical spine. 3. Mild cervical spine spondylosis as described above.  10-16-17: renal ultrasound: Debris within urinary bladder. No obstructive uropathy. 9 mm RIGHT lower pole artifact, less likely vascular calcification or nonobstructing nephrolithiasis.   10-21-17: ct of abdomen and pelvis: Mild prostatic enlargement. No acute intra-abdominal or intrapelvic abnormalities.Aortic Atherosclerosis .  10-22-17: ct of head: Negative head CT.  NO NEW EXAMS   LABS REVIEWED PREVIOUS:   10-14-17: wbc 9.1; hgb 13.7; hct 40.2; mcv 89.9 plt 221; glucose 102; bun 13; creat 0.64; k+ 3.5; na++ 143; ca 9.4; liver normal albumin 3.6 Ammonia 17; vit B 12: 317; tsh 0.833; mag 2.0; depakote <10; HIV: nr; RPR: nr; urine drug screen: neg urine culture: staphylococcus lugdunensis  10-19-17: wbc 5.7; hgb 11.5; hct 34.6; mcv 91.3 plt 159; glucose 78; bun 13; creat 0.57; k+ 3.7; na++ 140; ca 8.7; phos 2.9 10-22-17: depakote 87 11-04-17: depakote 61  NO NEW LABS    Review of Systems  Unable to perform ROS: Other (schizophrenia )   Physical Exam  Constitutional: No distress.  Frail   Neck: No thyromegaly present.  Cardiovascular: Normal rate, regular rhythm, normal heart sounds and intact distal pulses.  Pulmonary/Chest: Effort normal and breath sounds normal. No respiratory distress.  Abdominal: Soft. Bowel sounds are normal. He exhibits no distension. There is no tenderness.  Musculoskeletal: He exhibits no edema.  s able to move all extremities Does have spastic movements of lower extremities in bed Has finger rolling movement of both hands No abnormal facial movements    Lymphadenopathy:    He has no cervical adenopathy.  Neurological: He is alert.  Skin: Skin is warm and dry. He  is not diaphoretic.  Dressing to naval dry and intact   Psychiatric:  He is talking to himself Is agitated      ASSESSMENT/ PLAN:  TODAY:   1. Schizoaffective disorder (bipolar) catatonia schizophrenia 2. Malnutrition of moderate degree 3. Physical deconditioning    Will continue therapy as directed Will continue supplements per facility protocol Will increase his seroquel xr to 400 mg daily   Time spent with patient and family 45 minutes: 20 minutes spent with advanced directives; have fill out MOST Form: full code full hospitalization; IVF; ABT and tube feeding for a trial basis. Did discuss his therapy goals; what to expect. Family has verbalized understanding.   MD is aware of resident's  narcotic use and is in agreement with current plan of care. We will attempt to wean resident as apropriate   Ok Edwards NP [facility]  Contact [phone] Monday through Friday 8am- 5pm  After hours call [phone]

## 2017-11-09 ENCOUNTER — Encounter: Payer: Self-pay | Admitting: Internal Medicine

## 2017-11-09 ENCOUNTER — Non-Acute Institutional Stay (SKILLED_NURSING_FACILITY): Payer: Medicare Other | Admitting: Internal Medicine

## 2017-11-09 DIAGNOSIS — R338 Other retention of urine: Secondary | ICD-10-CM

## 2017-11-09 DIAGNOSIS — D649 Anemia, unspecified: Secondary | ICD-10-CM | POA: Diagnosis not present

## 2017-11-09 DIAGNOSIS — R5381 Other malaise: Secondary | ICD-10-CM

## 2017-11-09 DIAGNOSIS — N401 Enlarged prostate with lower urinary tract symptoms: Secondary | ICD-10-CM | POA: Diagnosis not present

## 2017-11-09 DIAGNOSIS — E876 Hypokalemia: Secondary | ICD-10-CM

## 2017-11-09 DIAGNOSIS — F250 Schizoaffective disorder, bipolar type: Secondary | ICD-10-CM | POA: Diagnosis not present

## 2017-11-09 NOTE — Progress Notes (Deleted)
Location:  [facility] Room Number: [place] [facility] of Service:  SNF ([id number]) Provider:  [place], FNP  Patient Care Team: Barrie Lyme, FNP as PCP - General (Nurse Practitioner)  Extended Emergency Contact Information Primary Emergency Contact: [facility] Address: [address] [place], [place] [address] [place] Phone: [phone] Relation: Mother  Code Status:  Full Code Goals of care: Advanced Directive information Advanced Directives 11/09/2017  Does Patient Have a Medical Advance Directive? -  Would patient like information on creating a medical advance directive? No - Patient declined  Some encounter information is confidential and restricted. Go to Review Flowsheets activity to see all data.     Chief Complaint  Patient presents with  . Medical Management of Chronic Issues    Resident is being seen for a routine visit.     HPI:  Pt is a 57 y.o. male seen today for medical management of chronic diseases.     Past Medical History:  Diagnosis Date  . Manic depression ([place])   . Mental disorder   . Schizoaffective disorder [facility])    Past Surgical History:  Procedure Laterality Date  . TONSILLECTOMY      Allergies  Allergen Reactions  . Penicillins Shortness Of Breath    .Has patient had a PCN reaction causing immediate rash, facial/tongue/throat swelling, SOB or lightheadedness with hypotension: no Has patient had a PCN reaction causing severe rash involving mucus membranes or skin necrosis: yes Has patient had a PCN reaction that required hospitalization: no Has patient had a PCN reaction occurring within the last 10 years: no If all of the above answers are "NO", then may proceed with Cephalosporin use.     Outpatient Encounter Medications as of 11/09/2017  Medication Sig  . divalproex (DEPAKOTE ER) 500 MG 24 hr tablet Take 1 tablet (500 mg total) by mouth daily  with breakfast.  . divalproex (DEPAKOTE ER) 500 MG 24 hr tablet Take 2 tablets (1,000 mg total) by mouth daily with supper.  Marland Kitchen LORazepam (ATIVAN) 0.5 MG tablet Take 1 tablet (0.5 mg total) by mouth every 8 (eight) hours.  . Multiple Vitamin (MULTIVITAMIN WITH MINERALS) TABS tablet Take 1 tablet by mouth daily.  . mupirocin cream (BACTROBAN) 2 % Apply topically daily. Apply until resolution of peri-naval wound  . QUEtiapine (SEROQUEL) 400 MG tablet Give 1 tablet (400 mg) by mouth daily at lunch.  . tamsulosin (FLOMAX) 0.4 MG CAPS capsule Take 2 capsules (0.8 mg total) by mouth daily.  . [DISCONTINUED] QUEtiapine (SEROQUEL XR) 300 MG 24 hr tablet Take 1 tablet (300 mg total) by mouth daily with lunch.   No facility-administered encounter medications on file as of 11/09/2017.     Review of Systems  Immunization History  Administered Date(s) Administered  . PPD Test 07/29/2017   Pertinent  Health Maintenance Due  Topic Date Due  . COLONOSCOPY  06/13/2013  . INFLUENZA VACCINE  04/12/2017   No flowsheet data found. Functional Status Survey:    Vitals:   11/09/17 1428  BP: (!) 116/96  Pulse: 95  Resp: 16  Temp: 98.1 F (36.7 C)  SpO2: 98%  Weight: 134 lb (60.8 kg)  Height: 5\' 10"  (1.778 m)   Body mass index is 19.23 kg/m. Physical Exam  Labs reviewed: Recent Labs    10/14/17 1120  10/18/17 0941  10/19/17 0936  10/28/17  3810 11/01/17 0410 11/04/17 0415 11/04/17 0945  NA 143   < > 138   < >  --    < > 141 137  --  137  K 3.5   < > 4.5   < >  --    < > 4.0 3.9  --  4.2  CL 108   < > 105   < >  --    < > 100* 97*  --  98*  CO2 21*   < > 23   < >  --    < > 29 28  --  26  GLUCOSE 102*   < > 150*   < >  --    < > 82 82  --  112*  BUN 13   < > 12   < >  --    < > 8 8  --  8  CREATININE 0.64   < > 0.76   < >  --    < > 0.61 0.62 0.53* 0.50*  CALCIUM 9.4   < > 9.2   < >  --    < > 9.3 8.9  --  8.9  MG 2.0  --  1.9  --   --   --   --   --   --   --   PHOS  --   --  2.3*  --   2.9  --   --   --   --   --    < > = values in this interval not displayed.   Recent Labs    09/08/17 1238 10/14/17 1120 10/22/17 0846  AST 17 20 19   ALT 18 18 19   ALKPHOS 96 71 64  BILITOT 2.2* 1.0 0.6  PROT 7.8 7.1 6.6  ALBUMIN 4.6 3.6 3.1*   Recent Labs    07/17/17 1536  08/08/17 1857  10/14/17 1120  10/28/17 0442 11/01/17 0410 11/04/17 0834  WBC 9.6   < > 6.3   < > 9.1   < > 5.5 5.2 6.5  NEUTROABS 6.5  --  4.0  --  6.6  --   --   --   --   HGB 14.7   < > 14.7   < > 13.7   < > 10.2* 10.1* 10.2*  HCT 43.2   < > 43.1   < > 40.2   < > 32.2* 30.7* 31.4*  MCV 91.1   < > 90.5   < > 89.9   < > 94.7 94.5 95.7  PLT 252   < > 199   < > 221   < > 183 202 237   < > = values in this interval not displayed.   Lab Results  Component Value Date   TSH 0.883 10/14/2017   Lab Results  Component Value Date   HGBA1C 5.6 07/27/2017   Lab Results  Component Value Date   CHOL 105 07/27/2017   HDL 40 (L) 07/27/2017   LDLCALC 45 07/27/2017   TRIG 99 07/27/2017   CHOLHDL 2.6 07/27/2017    Significant Diagnostic Results in last 30 days:  Ct Head Wo Contrast  Result Date: 10/22/2017 CLINICAL DATA:  Altered level of consciousness EXAM: CT HEAD WITHOUT CONTRAST TECHNIQUE: Contiguous axial images were obtained from the base of the skull through the vertex without intravenous contrast. COMPARISON:  10/14/2017 FINDINGS: Brain: No evidence of acute infarction, hemorrhage, hydrocephalus, extra-axial collection or mass lesion/mass effect. Vascular:  No hyperdense vessel or unexpected calcification. Skull: Right posterior scalp scarring. Normal. Negative for fracture or focal lesion. Sinuses/Orbits: Negative IMPRESSION: Negative head CT. Electronically Signed   By: Monte Fantasia M.D.   On: 10/22/2017 10:27   Ct Head Wo Contrast  Result Date: 10/14/2017 CLINICAL DATA:  Polytrauma, Pt was found laying on his mother's porch, not known how long he was outside. Would not respond to EMS, In ED he is  alert but not answering questions appropriately EXAM: CT HEAD WITHOUT CONTRAST CT CERVICAL SPINE WITHOUT CONTRAST TECHNIQUE: Multidetector CT imaging of the head and cervical spine was performed following the standard protocol without intravenous contrast. Multiplanar CT image reconstructions of the cervical spine were also generated. COMPARISON:  03/16/2016 FINDINGS: CT HEAD FINDINGS Brain: No evidence of acute infarction, hemorrhage, hydrocephalus, extra-axial collection or mass lesion/mass effect. Vascular: No hyperdense vessel or unexpected calcification. Skull: No osseous abnormality. Sinuses/Orbits: Visualized paranasal sinuses are clear. Visualized mastoid sinuses are clear. Visualized orbits demonstrate no focal abnormality. Other: Mild left parietal scalp soft tissue swelling likely reflecting a contusion. CT CERVICAL SPINE FINDINGS Alignment: Normal. Skull base and vertebrae: No acute fracture. No primary bone lesion or focal pathologic process. Soft tissues and spinal canal: No prevertebral fluid or swelling. No visible canal hematoma. Disc levels: Mild degenerative disc disease with disc height loss at C5-6. Broad-based disc osteophyte complex at C5-6 with bilateral uncovertebral degenerative changes. No significant foraminal stenosis. Anterior bridging osteophyte at C4-5. Upper chest: Lung apices are clear. Other: Small sebaceous cyst in the upper posterior neck soft tissues. No other fluid collection or hematoma. IMPRESSION: 1. No acute intracranial pathology. 2. No acute osseous injury of the cervical spine. 3. Mild cervical spine spondylosis as described above. Electronically Signed   By: Kathreen Devoid   On: 10/14/2017 11:34   Ct Cervical Spine Wo Contrast  Result Date: 10/14/2017 CLINICAL DATA:  Polytrauma, Pt was found laying on his mother's porch, not known how long he was outside. Would not respond to EMS, In ED he is alert but not answering questions appropriately EXAM: CT HEAD WITHOUT  CONTRAST CT CERVICAL SPINE WITHOUT CONTRAST TECHNIQUE: Multidetector CT imaging of the head and cervical spine was performed following the standard protocol without intravenous contrast. Multiplanar CT image reconstructions of the cervical spine were also generated. COMPARISON:  03/16/2016 FINDINGS: CT HEAD FINDINGS Brain: No evidence of acute infarction, hemorrhage, hydrocephalus, extra-axial collection or mass lesion/mass effect. Vascular: No hyperdense vessel or unexpected calcification. Skull: No osseous abnormality. Sinuses/Orbits: Visualized paranasal sinuses are clear. Visualized mastoid sinuses are clear. Visualized orbits demonstrate no focal abnormality. Other: Mild left parietal scalp soft tissue swelling likely reflecting a contusion. CT CERVICAL SPINE FINDINGS Alignment: Normal. Skull base and vertebrae: No acute fracture. No primary bone lesion or focal pathologic process. Soft tissues and spinal canal: No prevertebral fluid or swelling. No visible canal hematoma. Disc levels: Mild degenerative disc disease with disc height loss at C5-6. Broad-based disc osteophyte complex at C5-6 with bilateral uncovertebral degenerative changes. No significant foraminal stenosis. Anterior bridging osteophyte at C4-5. Upper chest: Lung apices are clear. Other: Small sebaceous cyst in the upper posterior neck soft tissues. No other fluid collection or hematoma. IMPRESSION: 1. No acute intracranial pathology. 2. No acute osseous injury of the cervical spine. 3. Mild cervical spine spondylosis as described above. Electronically Signed   By: Kathreen Devoid   On: 10/14/2017 11:34   Ct Abdomen Pelvis W Contrast  Result Date: 10/21/2017 CLINICAL DATA:  Soft tissue infection  of abdomen, initial encounter EXAM: CT ABDOMEN AND PELVIS WITH CONTRAST TECHNIQUE: Multidetector CT imaging of the abdomen and pelvis was performed using the standard protocol following bolus administration of intravenous contrast. Sagittal and coronal  MPR images reconstructed from axial data set. CONTRAST:  193mL ISOVUE-300 IOPAMIDOL (ISOVUE-300) INJECTION 61% IV. Dilute oral contrast. COMPARISON:  None FINDINGS: Lower chest: Mild dependent bibasilar atelectasis Hepatobiliary: Normal appearance Pancreas: Normal appearance Spleen: Normal appearance Adrenals/Urinary Tract: Adrenal glands, kidneys, ureters, and bladder normal appearance. Mild prostatic enlargement with central calcifications. Stomach/Bowel: Normal appendix. Stomach and bowel loops normal appearance. Vascular/Lymphatic: Mild atherosclerotic calcifications aorta. Aorta normal caliber. Multiple normal to upper normal sized inguinal and pelvic lymph nodes. No adenopathy. Reproductive: Seminal vesicles unremarkable Other: No free intraperitoneal air or fluid.  No hernia. Musculoskeletal: Normal appearance IMPRESSION: Mild prostatic enlargement. No acute intra-abdominal or intrapelvic abnormalities. Aortic Atherosclerosis (ICD10-I70.0). Electronically Signed   By: Lavonia Dana M.D.   On: 10/21/2017 18:00   US Renal  Result Date: 10/16/2017 CLINICAL DATA:  Bladder distension. EXAM: RENAL / URINARY TRACT ULTRASOUND COMPLETE COMPARISON:  None. FINDINGS: Right Kidney: Length: 11 cm. Echogenicity within normal limits. No mass or hydronephrosis visualized. 9 mm echogenic focus RIGHT lower pole with acoustic enhancement. Left Kidney: Length: 9.8 cm. Echogenicity within normal limits. No mass or hydronephrosis visualized. Bladder: Echogenic suspected debris in the urinary bladder. Prevoid volume 226 cc. Prostate is 4.3 x 3 x 3.1 cm. IMPRESSION: Debris within urinary bladder. No obstructive uropathy. 9 mm RIGHT lower pole artifact, less likely vascular calcification or nonobstructing nephrolithiasis. Electronically Signed   By: Elon Alas M.D.   On: 10/16/2017 23:38    Assessment/Plan    Family/ staff Communication:  Labs/tests ordered:    Cordella Register. Perlie Gold  [facility] [address] [place], [place] [address] [phone] Cell (Monday-Friday 8 AM - 5 PM) [phone] After 5 PM and follow prompts

## 2017-11-13 ENCOUNTER — Encounter: Payer: Self-pay | Admitting: Internal Medicine

## 2017-11-13 NOTE — Progress Notes (Signed)
Patient ID: Franklin Ayers, male   DOB: 04-Aug-1961, 57 y.o.   MRN: [id number]  Provider:  DR Arletha Grippe Location:  [facility] Room Number: 121 A Place of Service:  SNF (31)  PCP: Barrie Lyme, FNP Patient Care Team: Barrie Lyme, FNP as PCP - General (Nurse Practitioner)  Extended Emergency Contact Information Primary Emergency Contact: [facility] Address: [address] [place], [place] [address] [place] Phone: [phone] Relation: Mother  Code Status: FULL CODE Goals of Care: Advanced Directive information Advanced Directives 11/09/2017  Does Patient Have a Medical Advance Directive? -  Would patient like information on creating a medical advance directive? No - Patient declined  Some encounter information is confidential and restricted. Go to Review Flowsheets activity to see all data.      Chief Complaint  Patient presents with  . New Admit To SNF    from hospital    HPI: Patient is a 57 y.o. male seen today for admission to SNF following hospital stay for UTI with hematuria, BPH with acute urinary retention, hypokalemia, moderate malnutrition, hx anemia, naval skin infection. He presented to the ED with altered mental status after missing a dose of seroquel and depakote at home. Full w/u completed and revealed neg UDS; depakote level <10; no acute changes on CT head. Albumin 3.1; NH3 level 47; Hgb 10.2; depakote level 61 at d/c. psychosis resolved with medication but neg features persisted. Psych consulted and recommended inpt psych care; however condition improved and he no longer met criteria. He presents to SNF for short term rehab.  Today he reports no concerns. Denies auditory hallucinations. seroquel dose increased yesterday due to hallucinations. No SI/HI. He is a poor historian due to psych d/o. Hx obtained from chart.   Recent acute urinary retention with BPH - stable on flomax 0.8 mg  daily; he is s/p foley cath during hospital admission  Anemia - stable. Hgb 10.2 at d/c  Schizoaffective disorder (bipolar) with catatonia schizophrenia - mood stable on seroquel xr; depakote ER; ativen 0.5 mg three times daily    Past Medical History:  Diagnosis Date  . Manic depression ([place])   . Mental disorder   . Schizoaffective disorder [facility])    Past Surgical History:  Procedure Laterality Date  . TONSILLECTOMY      reports that he has quit smoking. His smoking use included cigarettes. He smoked 2.00 packs per day. he has never used smokeless tobacco. He reports that he does not drink alcohol or use drugs. Social History   Socioeconomic History  . Marital status: Single    Spouse name: Not on file  . Number of children: Not on file  . Years of education: Not on file  . Highest education level: Not on file  Social Needs  . Financial resource strain: Not on file  . Food insecurity - worry: Not on file  . Food insecurity - inability: Not on file  . Transportation needs - medical: Not on file  . Transportation needs - non-medical: Not on file  Occupational History  . Not on file  Tobacco Use  . Smoking status: Former Smoker    Packs/day: 2.00    Types: Cigarettes  . Smokeless tobacco: Never Used  . Tobacco comment: refused  Substance and Sexual Activity  . Alcohol use: No    Frequency: Never    Comment: occasional  . Drug use: No  .  Sexual activity: No  Other Topics Concern  . Not on file  Social History Narrative  . Not on file    Functional Status Survey:    Family History  Problem Relation Age of Onset  . Cancer Other   . Stroke Other     Health Maintenance  Topic Date Due  . Hepatitis C Screening  [birth year]  . TETANUS/TDAP  06/13/1982  . COLONOSCOPY  06/13/2013  . INFLUENZA VACCINE  04/12/2017  . HIV Screening  Completed    Allergies  Allergen Reactions  . Penicillins Shortness Of Breath    .Has patient had a PCN reaction causing  immediate rash, facial/tongue/throat swelling, SOB or lightheadedness with hypotension: no Has patient had a PCN reaction causing severe rash involving mucus membranes or skin necrosis: yes Has patient had a PCN reaction that required hospitalization: no Has patient had a PCN reaction occurring within the last 10 years: no If all of the above answers are "NO", then may proceed with Cephalosporin use.     Outpatient Encounter Medications as of 11/09/2017  Medication Sig  . divalproex (DEPAKOTE ER) 500 MG 24 hr tablet Take 1 tablet (500 mg total) by mouth daily with breakfast.  . divalproex (DEPAKOTE ER) 500 MG 24 hr tablet Take 2 tablets (1,000 mg total) by mouth daily with supper.  Marland Kitchen LORazepam (ATIVAN) 0.5 MG tablet Take 1 tablet (0.5 mg total) by mouth every 8 (eight) hours.  . Multiple Vitamin (MULTIVITAMIN WITH MINERALS) TABS tablet Take 1 tablet by mouth daily.  . mupirocin cream (BACTROBAN) 2 % Apply topically daily. Apply until resolution of peri-naval wound  . QUEtiapine (SEROQUEL) 400 MG tablet Give 1 tablet (400 mg) by mouth daily at lunch.  . tamsulosin (FLOMAX) 0.4 MG CAPS capsule Take 2 capsules (0.8 mg total) by mouth daily.  . [DISCONTINUED] QUEtiapine (SEROQUEL XR) 300 MG 24 hr tablet Take 1 tablet (300 mg total) by mouth daily with lunch.   No facility-administered encounter medications on file as of 11/09/2017.     Review of Systems  Unable to perform ROS: Psychiatric disorder    Vitals:   11/09/17 1428  BP: (!) 116/96  Pulse: 95  Resp: 16  Temp: 98.1 F (36.7 C)  SpO2: 98%  Weight: 134 lb (60.8 kg)  Height: 5' 10"  (1.778 m)   Body mass index is 19.23 kg/m. Physical Exam  Constitutional: He appears well-developed.  Frail appearing sitting in w/c in NAD  HENT:  Mouth/Throat: Oropharynx is clear and moist.  Poor dentition; MMM; no oral thrush  Eyes: Pupils are equal, round, and reactive to light. No scleral icterus.  Neck: Neck supple. Carotid bruit is not  present. No thyromegaly present.  Cardiovascular: Normal rate, regular rhythm and intact distal pulses. Exam reveals no gallop and no friction rub.  Murmur (1/6 SEM) heard. No distal LE edema. No calf TTP  Pulmonary/Chest: Effort normal and breath sounds normal. He has no wheezes. He has no rales. He exhibits no tenderness.  Abdominal: Soft. Normal appearance and bowel sounds are normal. He exhibits no distension, no abdominal bruit, no pulsatile midline mass and no mass. There is no hepatomegaly. There is no tenderness. There is no rigidity, no rebound and no guarding. No hernia.  Musculoskeletal: He exhibits edema.  Lymphadenopathy:    He has no cervical adenopathy.  Neurological: He is alert.  Skin: Skin is warm and dry. No rash noted.  Psychiatric: He has a normal mood and affect. His behavior is normal.  Labs reviewed: Basic Metabolic Panel: Recent Labs    10/14/17 1120  10/18/17 0941  10/19/17 0936  10/28/17 0442 11/01/17 0410 11/04/17 0415 11/04/17 0945  NA 143   < > 138   < >  --    < > 141 137  --  137  K 3.5   < > 4.5   < >  --    < > 4.0 3.9  --  4.2  CL 108   < > 105   < >  --    < > 100* 97*  --  98*  CO2 21*   < > 23   < >  --    < > 29 28  --  26  GLUCOSE 102*   < > 150*   < >  --    < > 82 82  --  112*  BUN 13   < > 12   < >  --    < > 8 8  --  8  CREATININE 0.64   < > 0.76   < >  --    < > 0.61 0.62 0.53* 0.50*  CALCIUM 9.4   < > 9.2   < >  --    < > 9.3 8.9  --  8.9  MG 2.0  --  1.9  --   --   --   --   --   --   --   PHOS  --   --  2.3*  --  2.9  --   --   --   --   --    < > = values in this interval not displayed.   Liver Function Tests: Recent Labs    09/08/17 1238 10/14/17 1120 10/22/17 0846  AST 17 20 19   ALT 18 18 19   ALKPHOS 96 71 64  BILITOT 2.2* 1.0 0.6  PROT 7.8 7.1 6.6  ALBUMIN 4.6 3.6 3.1*   No results for input(s): LIPASE, AMYLASE in the last 8760 hours. Recent Labs    10/14/17 1120 10/14/17 2142 10/22/17 0851  AMMONIA 17 30  47*   CBC: Recent Labs    07/17/17 1536  08/08/17 1857  10/14/17 1120  10/28/17 0442 11/01/17 0410 11/04/17 0834  WBC 9.6   < > 6.3   < > 9.1   < > 5.5 5.2 6.5  NEUTROABS 6.5  --  4.0  --  6.6  --   --   --   --   HGB 14.7   < > 14.7   < > 13.7   < > 10.2* 10.1* 10.2*  HCT 43.2   < > 43.1   < > 40.2   < > 32.2* 30.7* 31.4*  MCV 91.1   < > 90.5   < > 89.9   < > 94.7 94.5 95.7  PLT 252   < > 199   < > 221   < > 183 202 237   < > = values in this interval not displayed.   Cardiac Enzymes: Recent Labs    08/08/17 1857 10/22/17 0911  TROPONINI <0.03 <0.03   BNP: Invalid input(s): POCBNP Lab Results  Component Value Date   HGBA1C 5.6 07/27/2017   Lab Results  Component Value Date   TSH 0.883 10/14/2017   Lab Results  Component Value Date   VITAMINB12 317 10/14/2017   No results found for: FOLATE No results found for: IRON, TIBC, FERRITIN  Imaging and Procedures obtained prior to SNF admission: Ct Head Wo Contrast  Result Date: 10/14/2017 CLINICAL DATA:  Polytrauma, Pt was found laying on his mother's porch, not known how long he was outside. Would not respond to EMS, In ED he is alert but not answering questions appropriately EXAM: CT HEAD WITHOUT CONTRAST CT CERVICAL SPINE WITHOUT CONTRAST TECHNIQUE: Multidetector CT imaging of the head and cervical spine was performed following the standard protocol without intravenous contrast. Multiplanar CT image reconstructions of the cervical spine were also generated. COMPARISON:  03/16/2016 FINDINGS: CT HEAD FINDINGS Brain: No evidence of acute infarction, hemorrhage, hydrocephalus, extra-axial collection or mass lesion/mass effect. Vascular: No hyperdense vessel or unexpected calcification. Skull: No osseous abnormality. Sinuses/Orbits: Visualized paranasal sinuses are clear. Visualized mastoid sinuses are clear. Visualized orbits demonstrate no focal abnormality. Other: Mild left parietal scalp soft tissue swelling likely reflecting  a contusion. CT CERVICAL SPINE FINDINGS Alignment: Normal. Skull base and vertebrae: No acute fracture. No primary bone lesion or focal pathologic process. Soft tissues and spinal canal: No prevertebral fluid or swelling. No visible canal hematoma. Disc levels: Mild degenerative disc disease with disc height loss at C5-6. Broad-based disc osteophyte complex at C5-6 with bilateral uncovertebral degenerative changes. No significant foraminal stenosis. Anterior bridging osteophyte at C4-5. Upper chest: Lung apices are clear. Other: Small sebaceous cyst in the upper posterior neck soft tissues. No other fluid collection or hematoma. IMPRESSION: 1. No acute intracranial pathology. 2. No acute osseous injury of the cervical spine. 3. Mild cervical spine spondylosis as described above. Electronically Signed   By: Kathreen Devoid   On: 10/14/2017 11:34   Ct Cervical Spine Wo Contrast  Result Date: 10/14/2017 CLINICAL DATA:  Polytrauma, Pt was found laying on his mother's porch, not known how long he was outside. Would not respond to EMS, In ED he is alert but not answering questions appropriately EXAM: CT HEAD WITHOUT CONTRAST CT CERVICAL SPINE WITHOUT CONTRAST TECHNIQUE: Multidetector CT imaging of the head and cervical spine was performed following the standard protocol without intravenous contrast. Multiplanar CT image reconstructions of the cervical spine were also generated. COMPARISON:  03/16/2016 FINDINGS: CT HEAD FINDINGS Brain: No evidence of acute infarction, hemorrhage, hydrocephalus, extra-axial collection or mass lesion/mass effect. Vascular: No hyperdense vessel or unexpected calcification. Skull: No osseous abnormality. Sinuses/Orbits: Visualized paranasal sinuses are clear. Visualized mastoid sinuses are clear. Visualized orbits demonstrate no focal abnormality. Other: Mild left parietal scalp soft tissue swelling likely reflecting a contusion. CT CERVICAL SPINE FINDINGS Alignment: Normal. Skull base and  vertebrae: No acute fracture. No primary bone lesion or focal pathologic process. Soft tissues and spinal canal: No prevertebral fluid or swelling. No visible canal hematoma. Disc levels: Mild degenerative disc disease with disc height loss at C5-6. Broad-based disc osteophyte complex at C5-6 with bilateral uncovertebral degenerative changes. No significant foraminal stenosis. Anterior bridging osteophyte at C4-5. Upper chest: Lung apices are clear. Other: Small sebaceous cyst in the upper posterior neck soft tissues. No other fluid collection or hematoma. IMPRESSION: 1. No acute intracranial pathology. 2. No acute osseous injury of the cervical spine. 3. Mild cervical spine spondylosis as described above. Electronically Signed   By: Kathreen Devoid   On: 10/14/2017 11:34    Assessment/Plan   ICD-10-CM   1. Physical deconditioning R53.81   2. Schizoaffective disorder, bipolar type ([place]) F25.0   3. Benign prostatic hyperplasia with urinary retention N40.1    R33.8   4. Anemia, unspecified type D64.9   5. Hypokalemia E87.6  Cont current meds as ordered  Cont nutritional supplements per facility protocol  PT/OT as ordered  Psych services to follow  Will follow  GOAL: short term rehab and d/c home when medically appropriate. Communicated with pt and nursing.   Labs/tests ordered: none  Denora Wysocki S. Perlie Gold  [facility] [address] [place], [place] [address] [phone] Cell (Monday-Friday 8 AM - 5 PM) [phone] After 5 PM and follow prompts

## 2017-11-16 ENCOUNTER — Encounter: Payer: Self-pay | Admitting: Adult Health

## 2017-11-16 ENCOUNTER — Non-Acute Institutional Stay (SKILLED_NURSING_FACILITY): Payer: Medicare Other | Admitting: Adult Health

## 2017-11-16 DIAGNOSIS — R509 Fever, unspecified: Secondary | ICD-10-CM | POA: Diagnosis not present

## 2017-11-16 LAB — CBC AND DIFFERENTIAL
HEMATOCRIT: 33 — AB (ref 41–53)
HEMOGLOBIN: 11.2 — AB (ref 13.5–17.5)
Neutrophils Absolute: 3
PLATELETS: 189 (ref 150–399)
WBC: 6.2

## 2017-11-16 LAB — BASIC METABOLIC PANEL
BUN: 13 (ref 4–21)
Creatinine: 0.5 — AB (ref 0.6–1.3)
Glucose: 92
Potassium: 3.7 (ref 3.4–5.3)
Sodium: 138 (ref 137–147)

## 2017-11-16 LAB — HEPATIC FUNCTION PANEL
ALT: 16 (ref 10–40)
AST: 18 (ref 14–40)
Alkaline Phosphatase: 54 (ref 25–125)
BILIRUBIN, TOTAL: 0.5

## 2017-11-16 NOTE — Progress Notes (Signed)
Location:   [facility] Room Number: 121 A Place of Service:  SNF (31)   CODE STATUS: Full Code  Allergies  Allergen Reactions  . Penicillins Shortness Of Breath    .Has patient had a PCN reaction causing immediate rash, facial/tongue/throat swelling, SOB or lightheadedness with hypotension: no Has patient had a PCN reaction causing severe rash involving mucus membranes or skin necrosis: yes Has patient had a PCN reaction that required hospitalization: no Has patient had a PCN reaction occurring within the last 10 years: no If all of the above answers are "NO", then may proceed with Cephalosporin use.     Chief Complaint  Patient presents with  . Acute Visit    Fever    HPI:    Past Medical History:  Diagnosis Date  . Manic depression ([place])   . Mental disorder   . Schizoaffective disorder [facility])     Past Surgical History:  Procedure Laterality Date  . TONSILLECTOMY      Social History   Socioeconomic History  . Marital status: Single    Spouse name: Not on file  . Number of children: Not on file  . Years of education: Not on file  . Highest education level: Not on file  Social Needs  . Financial resource strain: Not on file  . Food insecurity - worry: Not on file  . Food insecurity - inability: Not on file  . Transportation needs - medical: Not on file  . Transportation needs - non-medical: Not on file  Occupational History  . Not on file  Tobacco Use  . Smoking status: Former Smoker    Packs/day: 2.00    Types: Cigarettes  . Smokeless tobacco: Never Used  . Tobacco comment: refused  Substance and Sexual Activity  . Alcohol use: No    Frequency: Never    Comment: occasional  . Drug use: No  . Sexual activity: No  Other Topics Concern  . Not on file  Social History Narrative  . Not on file   Family History  Problem Relation Age of Onset  . Cancer Other   . Stroke Other       VITAL SIGNS BP 126/80   Pulse 92   Temp 100  F (37.8 C)   Resp 18   Ht 5\' 10"  (1.778 m)   Wt 123 lb 8 oz (56 kg)   SpO2 99%   BMI 17.72 kg/m   Outpatient Encounter Medications as of 11/16/2017  Medication Sig  . acetaminophen (TYLENOL) 325 MG tablet Take 650 mg by mouth every 4 (four) hours as needed.  . divalproex (DEPAKOTE ER) 500 MG 24 hr tablet Take 1 tablet (500 mg total) by mouth daily with breakfast.  . divalproex (DEPAKOTE ER) 500 MG 24 hr tablet Take 2 tablets (1,000 mg total) by mouth daily with supper.  Marland Kitchen LORazepam (ATIVAN) 0.5 MG tablet Take 1 tablet (0.5 mg total) by mouth every 8 (eight) hours.  . Multiple Vitamin (MULTIVITAMIN WITH MINERALS) TABS tablet Take 1 tablet by mouth daily.  . mupirocin cream (BACTROBAN) 2 % Apply topically daily. Apply until resolution of peri-naval wound  . QUEtiapine (SEROQUEL) 400 MG tablet Give 1 tablet (400 mg) by mouth daily at lunch.  . tamsulosin (FLOMAX) 0.4 MG CAPS capsule Take 2 capsules (0.8 mg total) by mouth daily.   No facility-administered encounter medications on file as of 11/16/2017.      SIGNIFICANT DIAGNOSTIC EXAMS  PREVIOUS:   10-14-17: ct of  head and cervical spine: 1. No acute intracranial pathology. 2. No acute osseous injury of the cervical spine. 3. Mild cervical spine spondylosis as described above.  10-16-17: renal ultrasound: Debris within urinary bladder. No obstructive uropathy. 9 mm RIGHT lower pole artifact, less likely vascular calcification or nonobstructing nephrolithiasis.   10-21-17: ct of abdomen and pelvis: Mild prostatic enlargement. No acute intra-abdominal or intrapelvic abnormalities.Aortic Atherosclerosis .  10-22-17: ct of head: Negative head CT.  NO NEW EXAMS   LABS REVIEWED PREVIOUS:   10-14-17: wbc 9.1; hgb 13.7; hct 40.2; mcv 89.9 plt 221; glucose 102; bun 13; creat 0.64; k+ 3.5; na++ 143; ca 9.4; liver normal albumin 3.6 Ammonia 17; vit B 12: 317; tsh 0.833; mag 2.0; depakote <10; HIV: nr; RPR: nr; urine drug screen: neg urine culture:  staphylococcus lugdunensis  10-19-17: wbc 5.7; hgb 11.5; hct 34.6; mcv 91.3 plt 159; glucose 78; bun 13; creat 0.57; k+ 3.7; na++ 140; ca 8.7; phos 2.9 10-22-17: depakote 87 11-04-17: depakote 61  NO NEW LABS    Review of Systems  Unable to perform ROS: Other (schizophrenia )   Physical Exam  Constitutional: No distress.  Frail   Neck: No thyromegaly present.  Cardiovascular: Normal rate, regular rhythm, normal heart sounds and intact distal pulses.  Pulmonary/Chest: Effort normal and breath sounds normal. No respiratory distress.  Abdominal: Soft. Bowel sounds are normal. He exhibits no distension. There is no tenderness.  Musculoskeletal: He exhibits no edema.  s able to move all extremities Does have spastic movements of lower extremities in bed Has finger rolling movement of both hands No abnormal facial movements    Lymphadenopathy:    He has no cervical adenopathy.  Neurological: He is alert.  Skin: Skin is warm and dry. He is not diaphoretic.  Dressing to naval dry and intact   Psychiatric:  He is talking to himself Is agitated      ASSESSMENT/ PLAN:  TODAY:   1. Schizoaffective disorder (bipolar) catatonia schizophrenia 2. Malnutrition of moderate degree 3. Physical deconditioning    Will continue therapy as directed Will continue supplements per facility protocol Will increase his seroquel xr to 400 mg daily   Time spent with patient and family 45 minutes: 20 minutes spent with advanced directives; have fill out MOST Form: full code full hospitalization; IVF; ABT and tube feeding for a trial basis. Did discuss his therapy goals; what to expect. Family has verbalized understanding.    MD is aware of resident's narcotic use and is in agreement with current plan of care. We will attempt to wean resident as apropriate     Ok Edwards NP [facility]  Contact [phone] Monday through Friday 8am- 5pm  After hours call [phone]

## 2017-11-16 NOTE — Progress Notes (Signed)
Location:   [place] Room Number: 121 A Place of Service:  SNF (31)   CODE STATUS: full code (MOST done 11-08-17)  Allergies  Allergen Reactions  . Penicillins Shortness Of Breath    .Has patient had a PCN reaction causing immediate rash, facial/tongue/throat swelling, SOB or lightheadedness with hypotension: no Has patient had a PCN reaction causing severe rash involving mucus membranes or skin necrosis: yes Has patient had a PCN reaction that required hospitalization: no Has patient had a PCN reaction occurring within the last 10 years: no If all of the above answers are "NO", then may proceed with Cephalosporin use.     Chief Complaint  Patient presents with  . Acute Visit    Fever    HPI:  Staff reports that he is having a fever of 100.0. He did not eat lunch; has poor fluid intake. He is a poor historian; but states that he is having a difficulty urinating. His naval area has resolved. He did complain of a sore throat and dry eyes.   Past Medical History:  Diagnosis Date  . Manic depression ([place])   . Mental disorder   . Schizoaffective disorder [facility])     Past Surgical History:  Procedure Laterality Date  . TONSILLECTOMY      Social History   Socioeconomic History  . Marital status: Single    Spouse name: Not on file  . Number of children: Not on file  . Years of education: Not on file  . Highest education level: Not on file  Social Needs  . Financial resource strain: Not on file  . Food insecurity - worry: Not on file  . Food insecurity - inability: Not on file  . Transportation needs - medical: Not on file  . Transportation needs - non-medical: Not on file  Occupational History  . Not on file  Tobacco Use  . Smoking status: Former Smoker    Packs/day: 2.00    Types: Cigarettes  . Smokeless tobacco: Never Used  . Tobacco comment: refused  Substance and Sexual Activity  . Alcohol use: No    Frequency: Never    Comment:  occasional  . Drug use: No  . Sexual activity: No  Other Topics Concern  . Not on file  Social History Narrative  . Not on file   Family History  Problem Relation Age of Onset  . Cancer Other   . Stroke Other       VITAL SIGNS BP 126/80   Pulse 92   Temp 100 F (37.8 C)   Resp 18   Ht 5\' 10"  (1.778 m)   Wt 123 lb 8 oz (56 kg)   SpO2 99%   BMI 17.72 kg/m    Outpatient Encounter Medications as of 11/16/2017  Medication Sig  . acetaminophen (TYLENOL) 325 MG tablet Take 650 mg by mouth every 4 (four) hours as needed.  . divalproex (DEPAKOTE ER) 500 MG 24 hr tablet Take 1 tablet (500 mg total) by mouth daily with breakfast.  . divalproex (DEPAKOTE ER) 500 MG 24 hr tablet Take 2 tablets (1,000 mg total) by mouth daily with supper.  Marland Kitchen LORazepam (ATIVAN) 0.5 MG tablet Take 1 tablet (0.5 mg total) by mouth every 8 (eight) hours.  . Multiple Vitamin (MULTIVITAMIN WITH MINERALS) TABS tablet Take 1 tablet by mouth daily.  . mupirocin cream (BACTROBAN) 2 % Apply topically daily. Apply until resolution of peri-naval wound  . QUEtiapine (SEROQUEL) 400 MG  tablet Give 1 tablet (400 mg) by mouth daily at lunch.  . tamsulosin (FLOMAX) 0.4 MG CAPS capsule Take 2 capsules (0.8 mg total) by mouth daily.   No facility-administered encounter medications on file as of 11/16/2017.      SIGNIFICANT DIAGNOSTIC EXAMS  PREVIOUS:   10-14-17: ct of head and cervical spine: 1. No acute intracranial pathology. 2. No acute osseous injury of the cervical spine. 3. Mild cervical spine spondylosis as described above.  10-16-17: renal ultrasound: Debris within urinary bladder. No obstructive uropathy. 9 mm RIGHT lower pole artifact, less likely vascular calcification or nonobstructing nephrolithiasis.   10-21-17: ct of abdomen and pelvis: Mild prostatic enlargement. No acute intra-abdominal or intrapelvic abnormalities.Aortic Atherosclerosis .  10-22-17: ct of head: Negative head CT.  NO NEW EXAMS   LABS  REVIEWED PREVIOUS:   10-14-17: wbc 9.1; hgb 13.7; hct 40.2; mcv 89.9 plt 221; glucose 102; bun 13; creat 0.64; k+ 3.5; na++ 143; ca 9.4; liver normal albumin 3.6 Ammonia 17; vit B 12: 317; tsh 0.833; mag 2.0; depakote <10; HIV: nr; RPR: nr; urine drug screen: neg urine culture: staphylococcus lugdunensis  10-19-17: wbc 5.7; hgb 11.5; hct 34.6; mcv 91.3 plt 159; glucose 78; bun 13; creat 0.57; k+ 3.7; na++ 140; ca 8.7; phos 2.9 10-22-17: depakote 87 11-04-17: depakote 61  NO NEW LABS    Review of Systems  Unable to perform ROS: Other (schizophrenia )   Physical Exam  Constitutional: No distress.  frail  HENT:  Oral mucosa is dry    Eyes: Conjunctivae are normal.  Eyes are dry   Neck: No JVD present. No thyromegaly present.  Cardiovascular: Normal rate, regular rhythm, normal heart sounds and intact distal pulses.  Pulmonary/Chest: Effort normal and breath sounds normal. No respiratory distress.  Abdominal: Soft. Bowel sounds are normal. He exhibits no distension. There is no tenderness.  Musculoskeletal: He exhibits no edema.  is able to move all extremities Does have spastic movements of lower extremities in bed Has finger rolling movement of both hands No abnormal facial movements     Lymphadenopathy:    He has no cervical adenopathy.  Neurological: He is alert.  Skin: Skin is warm and dry. He is not diaphoretic.  Is flushed   Psychiatric: He has a normal mood and affect.     ASSESSMENT/ PLAN:  TODAY:   1. Fever unknown origin: will get cbc; cmp; blood culture X2 sites; chest x-ray; I/O cath for ua/c&s. Will give NS 2 liters at 100 cc per hour. Will begin levaquin 750 mg daily for 10 days with probiotic twice daily for 14 days will monitor   MD is aware of resident's narcotic use and is in agreement with current plan of care. We will attempt to wean resident as apropriate   Ok Edwards NP [facility]  Contact [phone] Monday through Friday 8am- 5pm  After  hours call [phone]

## 2017-11-20 ENCOUNTER — Encounter: Payer: Self-pay | Admitting: Adult Health

## 2017-11-20 ENCOUNTER — Non-Acute Institutional Stay: Payer: Medicare Other | Admitting: Adult Health

## 2017-11-20 DIAGNOSIS — B964 Proteus (mirabilis) (morganii) as the cause of diseases classified elsewhere: Secondary | ICD-10-CM

## 2017-11-20 DIAGNOSIS — A498 Other bacterial infections of unspecified site: Secondary | ICD-10-CM | POA: Insufficient documentation

## 2017-11-20 NOTE — Progress Notes (Signed)
Location:   [facility] Room Number: 121 A Place of Service:  SNF (31)   CODE STATUS: Full Code  Allergies  Allergen Reactions  . Penicillins Shortness Of Breath    .Has patient had a PCN reaction causing immediate rash, facial/tongue/throat swelling, SOB or lightheadedness with hypotension: no Has patient had a PCN reaction causing severe rash involving mucus membranes or skin necrosis: yes Has patient had a PCN reaction that required hospitalization: no Has patient had a PCN reaction occurring within the last 10 years: no If all of the above answers are "NO", then may proceed with Cephalosporin use.     Chief Complaint  Patient presents with  . Acute Visit    UTI    HPI:  He had ran a fever last Friday. His lab work does demonstrate an uti. He was placed on levaquin; however; his culture is not sensitive to this abt. Due to his allergy to penicillin will need to start on ertapenem. He does have dysuria; there are no reports of changes in appetite. He is getting out of bed and is participating in therapy. There are no new nursing concerns at this time. He is unable to fully participate in the hpi or ros.   Past Medical History:  Diagnosis Date  . Manic depression ([place])   . Mental disorder   . Schizoaffective disorder [facility])     Past Surgical History:  Procedure Laterality Date  . TONSILLECTOMY      Social History   Socioeconomic History  . Marital status: Single    Spouse name: Not on file  . Number of children: Not on file  . Years of education: Not on file  . Highest education level: Not on file  Social Needs  . Financial resource strain: Not on file  . Food insecurity - worry: Not on file  . Food insecurity - inability: Not on file  . Transportation needs - medical: Not on file  . Transportation needs - non-medical: Not on file  Occupational History  . Not on file  Tobacco Use  . Smoking status: Former Smoker    Packs/day: 2.00    Types:  Cigarettes  . Smokeless tobacco: Never Used  . Tobacco comment: refused  Substance and Sexual Activity  . Alcohol use: No    Frequency: Never    Comment: occasional  . Drug use: No  . Sexual activity: No  Other Topics Concern  . Not on file  Social History Narrative  . Not on file   Family History  Problem Relation Age of Onset  . Cancer Other   . Stroke Other       VITAL SIGNS BP 120/74   Pulse 81   Temp (!) 100.7 F (38.2 C)   Resp 20   Ht 5\' 10"  (1.778 m)   Wt 123 lb 8 oz (56 kg)   SpO2 97%   BMI 17.72 kg/m   Outpatient Encounter Medications as of 11/20/2017  Medication Sig  . acetaminophen (TYLENOL) 325 MG tablet Take 650 mg by mouth every 4 (four) hours as needed.  . divalproex (DEPAKOTE ER) 500 MG 24 hr tablet Take 1 tablet (500 mg total) by mouth daily with breakfast.  . divalproex (DEPAKOTE ER) 500 MG 24 hr tablet Take 2 tablets (1,000 mg total) by mouth daily with supper.  Marland Kitchen ENSURE (ENSURE) Give 120cc by mouth three times daily  . levofloxacin (LEVAQUIN) 750 MG tablet Take 750 mg by mouth daily. X 10  days  . LORazepam (ATIVAN) 0.5 MG tablet Take 1 tablet (0.5 mg total) by mouth every 8 (eight) hours.  . Multiple Vitamin (MULTIVITAMIN WITH MINERALS) TABS tablet Take 1 tablet by mouth daily.  . mupirocin cream (BACTROBAN) 2 % Apply topically daily. Apply until resolution of peri-naval wound  . Nutritional Supplements (NUTRITIONAL SUPPLEMENT PO) HSG Regular diet - Mechanical Soft Texture, Regular / thin consistency  . Probiotic CAPS Take 1 capsule by mouth 2 (two) times daily.  . QUEtiapine (SEROQUEL) 400 MG tablet Give 1 tablet (400 mg) by mouth daily at lunch.  . tamsulosin (FLOMAX) 0.4 MG CAPS capsule Take 2 capsules (0.8 mg total) by mouth daily.   No facility-administered encounter medications on file as of 11/20/2017.      SIGNIFICANT DIAGNOSTIC EXAMS  PREVIOUS:   10-14-17: ct of head and cervical spine: 1. No acute intracranial pathology. 2. No acute  osseous injury of the cervical spine. 3. Mild cervical spine spondylosis as described above.  10-16-17: renal ultrasound: Debris within urinary bladder. No obstructive uropathy. 9 mm RIGHT lower pole artifact, less likely vascular calcification or nonobstructing nephrolithiasis.   10-21-17: ct of abdomen and pelvis: Mild prostatic enlargement. No acute intra-abdominal or intrapelvic abnormalities.Aortic Atherosclerosis .  10-22-17: ct of head: Negative head CT.  NO NEW EXAMS   LABS REVIEWED PREVIOUS:   10-14-17: wbc 9.1; hgb 13.7; hct 40.2; mcv 89.9 plt 221; glucose 102; bun 13; creat 0.64; k+ 3.5; na++ 143; ca 9.4; liver normal albumin 3.6 Ammonia 17; vit B 12: 317; tsh 0.833; mag 2.0; depakote <10; HIV: nr; RPR: nr; urine drug screen: neg urine culture: staphylococcus lugdunensis  10-19-17: wbc 5.7; hgb 11.5; hct 34.6; mcv 91.3 plt 159; glucose 78; bun 13; creat 0.57; k+ 3.7; na++ 140; ca 8.7; phos 2.9 10-22-17: depakote 87 11-04-17: depakote 61  TODAY;   11-16-17: wbc 5.2; hgb 11.2; hct 32.7; mcv 95.1 plt 189; glucose 92; bun 13; creat 0.51; k+ 3.7; na++ 138; ca 9.0; liver normal albumin 3.4 blood cultures: no growth  11-17-17: urine culture: morganella morganii: ertapenem     Review of Systems  Unable to perform ROS: Other (schizophrenia )   Physical Exam  Constitutional: No distress.  Frail   Neck: No thyromegaly present.  Cardiovascular: Normal rate, regular rhythm, normal heart sounds and intact distal pulses.  Pulmonary/Chest: Effort normal and breath sounds normal. No respiratory distress.  Abdominal: Soft. Bowel sounds are normal. He exhibits no distension. There is no tenderness.  Musculoskeletal: He exhibits no edema.  s able to move all extremities Does have spastic movements of lower extremities in bed Has finger rolling movement of both hands No abnormal facial movements    Lymphadenopathy:    He has no cervical adenopathy.  Neurological: He is alert.  Skin: Skin is warm and  dry. He is not diaphoretic.  Dressing to naval dry and intact   Psychiatric:  He is talking to himself Is agitated      ASSESSMENT/ PLAN:  TODAY:   1.  Morganella morganii UTI: will stop levaquin and will begin ertapenem 1 gm daily for 10 days. Will monitor his status.      MD is aware of resident's narcotic use and is in agreement with current plan of care. We will attempt to wean resident as apropriate    Ok Edwards NP [facility]  Contact [phone] Monday through Friday 8am- 5pm  After hours call [phone]

## 2017-12-05 ENCOUNTER — Non-Acute Institutional Stay (SKILLED_NURSING_FACILITY): Payer: Medicare Other | Admitting: Adult Health

## 2017-12-05 ENCOUNTER — Encounter: Payer: Self-pay | Admitting: Adult Health

## 2017-12-05 DIAGNOSIS — F250 Schizoaffective disorder, bipolar type: Secondary | ICD-10-CM

## 2017-12-05 DIAGNOSIS — D649 Anemia, unspecified: Secondary | ICD-10-CM

## 2017-12-05 DIAGNOSIS — R338 Other retention of urine: Secondary | ICD-10-CM

## 2017-12-05 DIAGNOSIS — F202 Catatonic schizophrenia: Secondary | ICD-10-CM | POA: Diagnosis not present

## 2017-12-05 DIAGNOSIS — E440 Moderate protein-calorie malnutrition: Secondary | ICD-10-CM

## 2017-12-05 DIAGNOSIS — N401 Enlarged prostate with lower urinary tract symptoms: Secondary | ICD-10-CM

## 2017-12-05 NOTE — Progress Notes (Signed)
Location:   [facility] Room Number: 121 A Place of Service:  SNF (31)   CODE STATUS: Full Code (Most form updated 11/08/17)  Allergies  Allergen Reactions  . Penicillins Shortness Of Breath    .Has patient had a PCN reaction causing immediate rash, facial/tongue/throat swelling, SOB or lightheadedness with hypotension: no Has patient had a PCN reaction causing severe rash involving mucus membranes or skin necrosis: yes Has patient had a PCN reaction that required hospitalization: no Has patient had a PCN reaction occurring within the last 10 years: no If all of the above answers are "NO", then may proceed with Cephalosporin use.     Chief Complaint  Patient presents with  . Medical Management of Chronic Issues    Bph; anemia; schizophrenia; malnutrition    HPI:  He is a 57 year old short term rehab resident of this facility being seen for the management of his chronic illnesses: bph; anemia; schizophrenia; malnutrition. He is doing well in therapy and is ambulating in the hallway with therapy. He denies any headaches; no difficulty with urination; or changes in appetite. No nursing concerns at this time.   Past Medical History:  Diagnosis Date  . Manic depression ([place])   . Mental disorder   . Schizoaffective disorder [facility])     Past Surgical History:  Procedure Laterality Date  . TONSILLECTOMY      Social History   Socioeconomic History  . Marital status: Single    Spouse name: Not on file  . Number of children: Not on file  . Years of education: Not on file  . Highest education level: Not on file  Occupational History  . Not on file  Social Needs  . Financial resource strain: Not on file  . Food insecurity:    Worry: Not on file    Inability: Not on file  . Transportation needs:    Medical: Not on file    Non-medical: Not on file  Tobacco Use  . Smoking status: Former Smoker    Packs/day: 2.00    Types: Cigarettes  . Smokeless tobacco:  Never Used  . Tobacco comment: refused  Substance and Sexual Activity  . Alcohol use: No    Frequency: Never    Comment: occasional  . Drug use: No  . Sexual activity: Never  Lifestyle  . Physical activity:    Days per week: Not on file    Minutes per session: Not on file  . Stress: Not on file  Relationships  . Social connections:    Talks on phone: Not on file    Gets together: Not on file    Attends religious service: Not on file    Active member of club or organization: Not on file    Attends meetings of clubs or organizations: Not on file    Relationship status: Not on file  . Intimate partner violence:    Fear of current or ex partner: Not on file    Emotionally abused: Not on file    Physically abused: Not on file    Forced sexual activity: Not on file  Other Topics Concern  . Not on file  Social History Narrative  . Not on file   Family History  Problem Relation Age of Onset  . Cancer Other   . Stroke Other       VITAL SIGNS BP 110/74   Pulse 95   Temp 97.8 F (36.6 C)   Resp 18  Ht 5\' 10"  (1.778 m)   Wt 124 lb (56.2 kg)   SpO2 96%   BMI 17.79 kg/m   Outpatient Encounter Medications as of 12/05/2017  Medication Sig  . acetaminophen (TYLENOL) 325 MG tablet Take 650 mg by mouth every 4 (four) hours as needed.  . divalproex (DEPAKOTE ER) 500 MG 24 hr tablet Take 1 tablet (500 mg total) by mouth daily with breakfast.  . divalproex (DEPAKOTE ER) 500 MG 24 hr tablet Take 2 tablets (1,000 mg total) by mouth daily with supper.  Marland Kitchen ENSURE (ENSURE) Give 120cc by mouth three times daily  . LORazepam (ATIVAN) 0.5 MG tablet Take 1 tablet (0.5 mg total) by mouth every 8 (eight) hours.  . Multiple Vitamin (MULTIVITAMIN WITH MINERALS) TABS tablet Take 1 tablet by mouth daily.  . mupirocin cream (BACTROBAN) 2 % Apply topically daily. Apply until resolution of peri-naval wound  . Nutritional Supplements (NUTRITIONAL SUPPLEMENT PO) HSG Regular diet - Mechanical Soft  Texture, Regular / thin consistency  . Probiotic CAPS Take 1 capsule by mouth 2 (two) times daily.  . QUEtiapine (SEROQUEL) 400 MG tablet Give 1 tablet (400 mg) by mouth daily at lunch.  . tamsulosin (FLOMAX) 0.4 MG CAPS capsule Take 2 capsules (0.8 mg total) by mouth daily.   No facility-administered encounter medications on file as of 12/05/2017.      SIGNIFICANT DIAGNOSTIC EXAMS  PREVIOUS:   10-14-17: ct of head and cervical spine: 1. No acute intracranial pathology. 2. No acute osseous injury of the cervical spine. 3. Mild cervical spine spondylosis as described above.  10-16-17: renal ultrasound: Debris within urinary bladder. No obstructive uropathy. 9 mm RIGHT lower pole artifact, less likely vascular calcification or nonobstructing nephrolithiasis.   10-21-17: ct of abdomen and pelvis: Mild prostatic enlargement. No acute intra-abdominal or intrapelvic abnormalities.Aortic Atherosclerosis .  10-22-17: ct of head: Negative head CT.  NO NEW EXAMS   LABS REVIEWED PREVIOUS:   10-14-17: wbc 9.1; hgb 13.7; hct 40.2; mcv 89.9 plt 221; glucose 102; bun 13; creat 0.64; k+ 3.5; na++ 143; ca 9.4; liver normal albumin 3.6 Ammonia 17; vit B 12: 317; tsh 0.833; mag 2.0; depakote <10; HIV: nr; RPR: nr; urine drug screen: neg urine culture: staphylococcus lugdunensis  10-19-17: wbc 5.7; hgb 11.5; hct 34.6; mcv 91.3 plt 159; glucose 78; bun 13; creat 0.57; k+ 3.7; na++ 140; ca 8.7; phos 2.9 10-22-17: depakote 87 11-04-17: depakote 61 11-16-17: wbc 5.2; hgb 11.2; hct 32.7; mcv 95.1 plt 189; glucose 92; bun 13; creat 0.51; k+ 3.7; na++ 138; ca 9.0; liver normal albumin 3.4 blood cultures: no growth  11-17-17: urine culture: morganella morganii: ertapenem  NO NEW LABS       Review of Systems  Constitutional: Negative for malaise/fatigue.  Respiratory: Negative for cough and shortness of breath.   Cardiovascular: Negative for chest pain, palpitations and leg swelling.  Gastrointestinal: Negative for  abdominal pain, constipation and heartburn.  Musculoskeletal: Negative for back pain, joint pain and myalgias.  Skin: Negative.   Neurological: Negative for dizziness.  Psychiatric/Behavioral: The patient is not nervous/anxious.     Physical Exam  Constitutional: He is oriented to person, place, and time. No distress.  Frail   Neck: No thyromegaly present.  Cardiovascular: Normal rate, regular rhythm, normal heart sounds and intact distal pulses.  Pulmonary/Chest: Effort normal and breath sounds normal. No respiratory distress.  Abdominal: Soft. Bowel sounds are normal. He exhibits no distension. There is no tenderness.  Musculoskeletal: Normal range of motion. He  exhibits no edema.  is able to move all extremities Does have spastic movements of lower extremities in bed Has finger rolling movement of both hands No abnormal facial movements   Lymphadenopathy:    He has no cervical adenopathy.  Neurological: He is alert and oriented to person, place, and time.  Skin: Skin is warm and dry. He is not diaphoretic.  Psychiatric: He has a normal mood and affect.     ASSESSMENT/ PLAN:  TODAY:   1. BPH with urinary symptoms: stable; did require foley in the hospital which has since been removed; will continue flomax 0.8 mg daily and will monitor   2. Anemia: stable hgb 11.5 will monitor   3. Malnutrition of moderate degree: albumin 3.6; will continue supplements as directed will monitor   4.  Schizoaffective disorder (bipolar); catatonia schizophrenia: without change in status; will continue seroquel xr 400 mg daily and depakote er 500 mg in the AM and 1 gm in the PM. Does take ativen 0.5 mg three times daily     MD is aware of resident's narcotic use and is in agreement with current plan of care. We will attempt to wean resident as apropriate    Ok Edwards NP [facility]  Contact [phone] Monday through Friday 8am- 5pm  After hours call [phone]

## 2017-12-06 ENCOUNTER — Non-Acute Institutional Stay (SKILLED_NURSING_FACILITY): Payer: Medicare Other | Admitting: Adult Health

## 2017-12-06 ENCOUNTER — Encounter: Payer: Self-pay | Admitting: Adult Health

## 2017-12-06 ENCOUNTER — Other Ambulatory Visit: Payer: Self-pay

## 2017-12-06 DIAGNOSIS — R5381 Other malaise: Secondary | ICD-10-CM | POA: Diagnosis not present

## 2017-12-06 DIAGNOSIS — F202 Catatonic schizophrenia: Secondary | ICD-10-CM

## 2017-12-06 DIAGNOSIS — E440 Moderate protein-calorie malnutrition: Secondary | ICD-10-CM

## 2017-12-06 MED ORDER — LORAZEPAM 0.5 MG PO TABS: 0.5000 mg | ORAL_TABLET | Freq: Three times a day (TID) | ORAL | 0 refills | Status: DC

## 2017-12-06 NOTE — Telephone Encounter (Signed)
RX faxed to [facility] @ [phone], phone number [phone] 

## 2017-12-06 NOTE — Progress Notes (Signed)
Location:   [facility] Room Number: 121 A Place of Service:  SNF (31)    CODE STATUS: Full Code  Allergies  Allergen Reactions  . Penicillins Shortness Of Breath    .Has patient had a PCN reaction causing immediate rash, facial/tongue/throat swelling, SOB or lightheadedness with hypotension: no Has patient had a PCN reaction causing severe rash involving mucus membranes or skin necrosis: yes Has patient had a PCN reaction that required hospitalization: no Has patient had a PCN reaction occurring within the last 10 years: no If all of the above answers are "NO", then may proceed with Cephalosporin use.     Chief Complaint  Patient presents with  . Discharge Note    Discharging to Assisted Living    HPI:  He is being discharged to assisted living. He will need home health for pt/ot. He will need a tub bench with back; and a front wheel walker. He will need his prescriptions written. He will follow up with the medical provider at the assisted lving.  He had been admitted to this facility for short tern rehab after being hospitalized for altered mental status. He had not been taking his medications for a prolonged period of time. His mother is no longer able to manage his needs at home.    Past Medical History:  Diagnosis Date  . Manic depression ([place])   . Mental disorder   . Schizoaffective disorder [facility])     Past Surgical History:  Procedure Laterality Date  . TONSILLECTOMY      Social History   Socioeconomic History  . Marital status: Single    Spouse name: Not on file  . Number of children: Not on file  . Years of education: Not on file  . Highest education level: Not on file  Occupational History  . Not on file  Social Needs  . Financial resource strain: Not on file  . Food insecurity:    Worry: Not on file    Inability: Not on file  . Transportation needs:    Medical: Not on file    Non-medical: Not on file  Tobacco Use  . Smoking  status: Former Smoker    Packs/day: 2.00    Types: Cigarettes  . Smokeless tobacco: Never Used  . Tobacco comment: refused  Substance and Sexual Activity  . Alcohol use: No    Frequency: Never    Comment: occasional  . Drug use: No  . Sexual activity: Never  Lifestyle  . Physical activity:    Days per week: Not on file    Minutes per session: Not on file  . Stress: Not on file  Relationships  . Social connections:    Talks on phone: Not on file    Gets together: Not on file    Attends religious service: Not on file    Active member of club or organization: Not on file    Attends meetings of clubs or organizations: Not on file    Relationship status: Not on file  . Intimate partner violence:    Fear of current or ex partner: Not on file    Emotionally abused: Not on file    Physically abused: Not on file    Forced sexual activity: Not on file  Other Topics Concern  . Not on file  Social History Narrative  . Not on file   Family History  Problem Relation Age of Onset  . Cancer Other   . Stroke Other  VITAL SIGNS BP 98/62   Pulse (!) 56   Temp 98 F (36.7 C)   Resp 16   Ht 5\' 10"  (1.778 m)   Wt 124 lb (56.2 kg)   SpO2 98%   BMI 17.79 kg/m   Patient's Medications  New Prescriptions   No medications on file  Previous Medications   ACETAMINOPHEN (TYLENOL) 325 MG TABLET    Take 650 mg by mouth every 4 (four) hours as needed.   DIVALPROEX (DEPAKOTE ER) 500 MG 24 HR TABLET    Take 1 tablet (500 mg total) by mouth daily with breakfast.   DIVALPROEX (DEPAKOTE ER) 500 MG 24 HR TABLET    Take 2 tablets (1,000 mg total) by mouth daily with supper.   ENSURE (ENSURE)    Give 120cc by mouth three times daily   LORAZEPAM (ATIVAN) 0.5 MG TABLET    Take 1 tablet (0.5 mg total) by mouth every 8 (eight) hours.   MULTIPLE VITAMIN (MULTIVITAMIN WITH MINERALS) TABS TABLET    Take 1 tablet by mouth daily.   MUPIROCIN CREAM (BACTROBAN) 2 %    Apply topically daily. Apply until  resolution of peri-naval wound   NUTRITIONAL SUPPLEMENTS (NUTRITIONAL SUPPLEMENT PO)    HSG Regular diet - Mechanical Soft Texture, Regular / thin consistency   QUETIAPINE (SEROQUEL) 400 MG TABLET    Give 1 tablet (400 mg) by mouth daily at lunch.   TAMSULOSIN (FLOMAX) 0.4 MG CAPS CAPSULE    Take 2 capsules (0.8 mg total) by mouth daily.  Modified Medications   No medications on file  Discontinued Medications   PROBIOTIC CAPS    Take 1 capsule by mouth 2 (two) times daily.     SIGNIFICANT DIAGNOSTIC EXAMS  PREVIOUS:   10-14-17: ct of head and cervical spine: 1. No acute intracranial pathology. 2. No acute osseous injury of the cervical spine. 3. Mild cervical spine spondylosis as described above.  10-16-17: renal ultrasound: Debris within urinary bladder. No obstructive uropathy. 9 mm RIGHT lower pole artifact, less likely vascular calcification or nonobstructing nephrolithiasis.   10-21-17: ct of abdomen and pelvis: Mild prostatic enlargement. No acute intra-abdominal or intrapelvic abnormalities.Aortic Atherosclerosis .  10-22-17: ct of head: Negative head CT.  NO NEW EXAMS   LABS REVIEWED PREVIOUS:   10-14-17: wbc 9.1; hgb 13.7; hct 40.2; mcv 89.9 plt 221; glucose 102; bun 13; creat 0.64; k+ 3.5; na++ 143; ca 9.4; liver normal albumin 3.6 Ammonia 17; vit B 12: 317; tsh 0.833; mag 2.0; depakote <10; HIV: nr; RPR: nr; urine drug screen: neg urine culture: staphylococcus lugdunensis  10-19-17: wbc 5.7; hgb 11.5; hct 34.6; mcv 91.3 plt 159; glucose 78; bun 13; creat 0.57; k+ 3.7; na++ 140; ca 8.7; phos 2.9 10-22-17: depakote 87 11-04-17: depakote 61 11-16-17: wbc 5.2; hgb 11.2; hct 32.7; mcv 95.1 plt 189; glucose 92; bun 13; creat 0.51; k+ 3.7; na++ 138; ca 9.0; liver normal albumin 3.4 blood cultures: no growth  11-17-17: urine culture: morganella morganii: ertapenem  NO NEW LABS       Review of Systems  Constitutional: Negative for malaise/fatigue.  Respiratory: Negative for cough and  shortness of breath.   Cardiovascular: Negative for chest pain, palpitations and leg swelling.  Gastrointestinal: Negative for abdominal pain, constipation and heartburn.  Musculoskeletal: Negative for back pain, joint pain and myalgias.  Skin: Negative.   Neurological: Negative for dizziness.  Psychiatric/Behavioral: The patient is not nervous/anxious.       Physical Exam  Constitutional: He is  oriented to person, place, and time. No distress.  Frail   Neck: No thyromegaly present.  Cardiovascular: Normal rate, regular rhythm, normal heart sounds and intact distal pulses.  Pulmonary/Chest: Effort normal and breath sounds normal. No respiratory distress.  Abdominal: Soft. Bowel sounds are normal. He exhibits no distension. There is no tenderness.  Musculoskeletal: Normal range of motion. He exhibits no edema.  is able to move all extremities Does have spastic movements of lower extremities in bed Has finger rolling movement of both hands No abnormal facial movements    Lymphadenopathy:    He has no cervical adenopathy.  Neurological: He is alert and oriented to person, place, and time.  Skin: Skin is warm and dry. He is not diaphoretic.  Psychiatric: He has a normal mood and affect.     ASSESSMENT/ PLAN:  Patient is being discharged with the following home health services:  Pt/ot: to evaluate and treat as indicated for gait balance strength adl training   Patient is being discharged with the following durable medical equipment:  Tub bench with back. Front wheel walker to allow him to maintain his current level of independence with his adls.   Patient has been advised to f/u with their PCP in 1-2 weeks to bring them up to date on their rehab stay.  Social services at facility was responsible for arranging this appointment.  Pt was provided with a 30 day supply of prescriptions for medications and refills must be obtained from their PCP.  For controlled substances, a more limited  supply may be provided adequate until PCP appointment only.  A 30 day supply of his prescription medications have been written as listed above #90 ativan 0.5 mg tabs   Time spent with patient 35 minutes: discussed medications; discharge expectations; home health needs and dme. Verbalized understanding.    Ok Edwards NP [facility]  Contact [phone] Monday through Friday 8am- 5pm  After hours call [phone]

## 2017-12-08 ENCOUNTER — Encounter: Payer: Self-pay | Admitting: Adult Health

## 2017-12-09 DIAGNOSIS — M6281 Muscle weakness (generalized): Secondary | ICD-10-CM

## 2017-12-09 DIAGNOSIS — R2681 Unsteadiness on feet: Secondary | ICD-10-CM | POA: Diagnosis not present

## 2017-12-09 DIAGNOSIS — F329 Major depressive disorder, single episode, unspecified: Secondary | ICD-10-CM

## 2017-12-09 DIAGNOSIS — F209 Schizophrenia, unspecified: Secondary | ICD-10-CM

## 2017-12-20 ENCOUNTER — Telehealth: Payer: Self-pay | Admitting: *Deleted

## 2017-12-20 NOTE — Telephone Encounter (Signed)
Vi with [facility] called requesting verbal orders for PT.   Informed her that she would need to call his Primary Provider because he has been discharged from the facility and once they are discharged orders go through the primary care.

## 2018-01-03 ENCOUNTER — Other Ambulatory Visit: Payer: Self-pay

## 2018-01-03 ENCOUNTER — Encounter (HOSPITAL_COMMUNITY): Payer: Self-pay | Admitting: Emergency Medicine

## 2018-01-03 DIAGNOSIS — F202 Catatonic schizophrenia: Secondary | ICD-10-CM | POA: Diagnosis not present

## 2018-01-03 DIAGNOSIS — Z87891 Personal history of nicotine dependence: Secondary | ICD-10-CM | POA: Insufficient documentation

## 2018-01-03 DIAGNOSIS — R52 Pain, unspecified: Secondary | ICD-10-CM | POA: Diagnosis not present

## 2018-01-03 DIAGNOSIS — Z79899 Other long term (current) drug therapy: Secondary | ICD-10-CM | POA: Insufficient documentation
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
## 2018-01-04 ENCOUNTER — Emergency Department (HOSPITAL_COMMUNITY)
Admission: EM | Admit: 2018-01-04 | Discharge: 2018-01-04 | Disposition: A | Discharge status: Home / Self Care | Payer: Medicare Other | Attending: Emergency Medicine | Admitting: Emergency Medicine

## 2018-01-04 HISTORY — DX: Dysphagia, unspecified: R13.10

## 2018-01-04 HISTORY — DX: Benign prostatic hyperplasia without lower urinary tract symptoms: N40.0

## 2018-01-04 HISTORY — DX: Hypokalemia: E87.6

## 2018-01-04 HISTORY — DX: Cystitis, unspecified without hematuria: N30.90

## 2018-01-04 HISTORY — DX: Anemia, unspecified: D64.9
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
## 2018-01-04 NOTE — Discharge Instructions (Signed)
We recommend the use of Tylenol or ibuprofen for any complaints of discomfort.  You may ice to areas of pain to limit inflammation.  Follow-up with your primary care doctor regarding your visit today.  Continue your daily medications.

## 2018-01-04 NOTE — ED Notes (Signed)
Pt walked pretty good but he stated that he wouldn't be able to walk a far distance. At first when Pt got up he was kind of wobbling but after he caught his balance he was doing good.

## 2018-01-04 NOTE — ED Notes (Signed)
ED Provider at bedside. 

## 2018-05-04 ENCOUNTER — Encounter (HOSPITAL_COMMUNITY): Payer: Self-pay | Admitting: *Deleted

## 2018-05-04 ENCOUNTER — Emergency Department (HOSPITAL_COMMUNITY)
Admission: EM | Admit: 2018-05-04 | Discharge: 2018-05-05 | Disposition: A | Discharge status: Home / Self Care | Payer: Medicare Other | Attending: Emergency Medicine | Admitting: Emergency Medicine

## 2018-05-04 ENCOUNTER — Emergency Department (HOSPITAL_COMMUNITY): Payer: Medicare Other

## 2018-05-04 ENCOUNTER — Other Ambulatory Visit: Payer: Self-pay

## 2018-05-04 DIAGNOSIS — R634 Abnormal weight loss: Secondary | ICD-10-CM | POA: Diagnosis present

## 2018-05-04 DIAGNOSIS — R627 Adult failure to thrive: Secondary | ICD-10-CM | POA: Insufficient documentation

## 2018-05-04 DIAGNOSIS — Z79899 Other long term (current) drug therapy: Secondary | ICD-10-CM | POA: Diagnosis not present

## 2018-05-04 DIAGNOSIS — Z87891 Personal history of nicotine dependence: Secondary | ICD-10-CM | POA: Insufficient documentation

## 2018-05-04 LAB — COMPREHENSIVE METABOLIC PANEL
ALT: 11 U/L (ref 0–44)
AST: 12 U/L — AB (ref 15–41)
Albumin: 4 g/dL (ref 3.5–5.0)
Alkaline Phosphatase: 67 U/L (ref 38–126)
Anion gap: 9 (ref 5–15)
BUN: 7 mg/dL (ref 6–20)
CHLORIDE: 103 mmol/L (ref 98–111)
CO2: 29 mmol/L (ref 22–32)
CREATININE: 0.61 mg/dL (ref 0.61–1.24)
Calcium: 9.8 mg/dL (ref 8.9–10.3)
GFR calc non Af Amer: >60 mL/min (ref >60)
GLUCOSE: 92 mg/dL (ref 70–99)
Potassium: 3.9 mmol/L (ref 3.5–5.1)
SODIUM: 141 mmol/L (ref 135–145)
Total Bilirubin: 0.8 mg/dL (ref 0.3–1.2)
Total Protein: 7.3 g/dL (ref 6.5–8.1)

## 2018-05-04 LAB — PHOSPHORUS: PHOSPHORUS: 3.6 mg/dL (ref 2.5–4.6)

## 2018-05-04 LAB — CBC WITH DIFFERENTIAL/PLATELET
Basophils Absolute: 0.1 10*3/uL (ref 0.0–0.1)
Basophils Relative: 1 %
EOS PCT: 1 %
Eosinophils Absolute: 0.1 10*3/uL (ref 0.0–0.7)
HCT: 32.3 % — ABNORMAL LOW (ref 39.0–52.0)
Hemoglobin: 10.7 g/dL — ABNORMAL LOW (ref 13.0–17.0)
LYMPHS ABS: 1.6 10*3/uL (ref 0.7–4.0)
LYMPHS PCT: 28 %
MCH: 31.3 pg (ref 26.0–34.0)
MCHC: 33.1 g/dL (ref 30.0–36.0)
MCV: 94.4 fL (ref 78.0–100.0)
MONOS PCT: 8 %
Monocytes Absolute: 0.4 10*3/uL (ref 0.1–1.0)
Neutro Abs: 3.5 10*3/uL (ref 1.7–7.7)
Neutrophils Relative %: 62 %
PLATELETS: 210 10*3/uL (ref 150–400)
RBC: 3.42 MIL/uL — AB (ref 4.22–5.81)
RDW: 15.3 % (ref 11.5–15.5)
WBC: 5.6 10*3/uL (ref 4.0–10.5)

## 2018-05-04 LAB — MAGNESIUM: Magnesium: 1.9 mg/dL (ref 1.7–2.4)

## 2018-05-04 LAB — I-STAT TROPONIN, ED: Troponin i, poc: 0.01 ng/mL (ref 0.00–0.08)

## 2018-05-04 LAB — LIPASE, BLOOD: Lipase: 34 U/L (ref 11–51)

## 2018-05-04 LAB — PROTIME-INR
INR: 1
Prothrombin Time: 13.1 seconds (ref 11.4–15.2)

## 2018-05-04 LAB — I-STAT CG4 LACTIC ACID, ED: Lactic Acid, Venous: 0.86 mmol/L (ref 0.5–1.9)

## 2018-05-04 LAB — TSH: TSH: 0.98 u[IU]/mL (ref 0.350–4.500)

## 2018-05-04 MED ORDER — SODIUM CHLORIDE 0.9 % IV SOLN
Freq: Once | INTRAVENOUS | Status: AC
  Administered 2018-05-04: 22:00:00 via INTRAVENOUS

## 2018-05-04 NOTE — ED Provider Notes (Signed)
[place] DEPT Provider Note   CSN: [id number] Arrival date & time: 05/04/18  1650     History   Chief Complaint Chief Complaint  Patient presents with  . Failure To Thrive    HPI Kumar Falwell is a 57 y.o. male.  HPI Patient's medical history is significant for schizoaffective disorder with history of psychosis and catatonia.  Also medical history of malnutrition.  Patient's mother is also present with him.  She reports that he has been having increasing weight loss and decreased oral intake.  This particularly became exacerbated over the past 3 days.  He had 2 teeth removed on the upper right side.  Plan is to ultimately get dentures.  She reports since his dental extraction however he has been eating nothing.  Patient does not have specific complaints.  Does endorse feeling weak.  He does not endorse specific pain complaints. Past Medical History:  Diagnosis Date  . Anemia   . BPH (benign prostatic hyperplasia)   . 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 Cystitis   . Dysphagia   . Hypokalemia   . Manic depression ([place])   . Mental disorder   . Schizoaffective disorder [facility])     Patient Active Problem List   Diagnosis Date Noted  . 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 Fever of unknown origin 11/16/2017  . Physical deconditioning 11/08/2017  . Benign prostatic hyperplasia   . 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 Hematuria   . Soft tissue infection   . Malnutrition of moderate degree 10/18/2017  . Acute urinary retention   . Anemia   . Hypokalemia   . Bladder distension   . Urinary tract infection with hematuria   . Altered mental status 10/14/2017  . Schizoaffective disorder ([place]) 12/23/2016    Past Surgical History:  Procedure Laterality Date  . TONSILLECTOMY          Home Medications    Prior to Admission medications   Medication Sig Start Date End Date Taking? Authorizing Provider  acetaminophen  (TYLENOL) 325 MG tablet Take 650 mg by mouth every 4 (four) hours as needed.    [provider]  divalproex (DEPAKOTE ER) 500 MG 24 hr tablet Take 1 tablet (500 mg total) by mouth daily with breakfast. 11/06/17   Bonnita Hollow, MD  divalproex (DEPAKOTE ER) 500 MG 24 hr tablet Take 2 tablets (1,000 mg total) by mouth daily with supper. 11/06/17   Bonnita Hollow, MD  ENSURE (ENSURE) Give 120cc by mouth three times daily 11/10/17   [provider]  LORazepam (ATIVAN) 0.5 MG tablet Take 1 tablet (0.5 mg total) by mouth every 8 (eight) hours. 12/06/17   Gerlene Fee, NP  Multiple Vitamin (MULTIVITAMIN WITH MINERALS) TABS tablet Take 1 tablet by mouth daily.    [provider]  mupirocin cream (BACTROBAN) 2 % Apply topically daily. Apply until resolution of peri-naval wound 11/06/17   Bonnita Hollow, MD  Nutritional Supplements (NUTRITIONAL SUPPLEMENT PO) HSG Regular diet - Mechanical Soft Texture, Regular / thin consistency    [provider]  QUEtiapine (SEROQUEL) 400 MG tablet Give 1 tablet (400 mg) by mouth daily at lunch.    [provider]  tamsulosin (FLOMAX) 0.4 MG CAPS capsule Take 2 capsules (0.8 mg total) by mouth daily. 11/06/17   Bonnita Hollow, MD    Family History Family History  Problem Relation Age of Onset  . Cancer Other   . Stroke  Other     Social History Social History   Tobacco Use  . Smoking status: Former Smoker    Packs/day: 2.00    Types: Cigarettes  . Smokeless tobacco: Never Used  . Tobacco comment: refused  Substance Use Topics  . Alcohol use: No    Frequency: Never    Comment: occasional  . Drug use: No     Allergies   Penicillins   Review of Systems Review of Systems 10 Systems reviewed and are negative for acute change except as noted in the HPI.  Physical Exam Updated Vital Signs BP 114/72 (BP Location: Left Arm)   Pulse (!) 46 Comment: RN notified  Temp 97.9 F (36.6 C) (Oral)   Resp  15   Ht 5\' 6"  (1.676 m)   SpO2 99%   BMI 20.01 kg/m   Physical Exam  Constitutional:  Patient is alert with no respiratory distress.  He is cachectic and extremely thin.  HENT:  Head: Normocephalic and atraumatic.  Dukas membranes slightly dry.  Seborrheic dermatitis of the scalp.  The patient's oral cavity shows recently extracted teeth on the upper right.  Areas clean dry and healing.  Eyes: Pupils are equal, round, and reactive to light. EOM are normal.  Neck: Neck supple.  Cardiovascular: Normal rate, regular rhythm, normal heart sounds and intact distal pulses.  Pulmonary/Chest: Effort normal and breath sounds normal.  Abdominal: Soft.  Patient's abdomen is very scaphoid.  No palpable masses.  No reproducible tenderness.  Musculoskeletal:  Extremities are cachectic.  No focal injuries.  No cellulitis.  No Joint effusions.  Neurological: He is alert. He exhibits normal muscle tone. Coordination normal.  Skin: Skin is warm and dry.  Psychiatric:  Patient is affect is slightly flat.  He is interactive and answering questions.  He does seem cognitively delayed.  No agitation.     ED Treatments / Results  Labs (all labs ordered are listed, but only abnormal results are displayed) Labs Reviewed  COMPREHENSIVE METABOLIC PANEL - Abnormal; Notable for the following components:      Result Value   AST 12 (*)    All other components within normal limits  CBC WITH DIFFERENTIAL/PLATELET - Abnormal; Notable for the following components:   RBC 3.42 (*)    Hemoglobin 10.7 (*)    HCT 32.3 (*)    All other components within normal limits  VALPROIC ACID LEVEL - Abnormal; Notable for the following components:   Valproic Acid Lvl <10 (*)    All other components within normal limits  LIPASE, BLOOD  PROTIME-INR  MAGNESIUM  PHOSPHORUS  TSH  URINALYSIS, ROUTINE W REFLEX MICROSCOPIC  T3, FREE  I-STAT TROPONIN, ED  I-STAT CG4 LACTIC ACID, ED  I-STAT CG4 LACTIC ACID, ED    EKG EKG  Interpretation  Date/Time:  Friday May 04 2018 22:10:12 EDT Ventricular Rate:  49 PR Interval:    QRS Duration: 108 QT Interval:  458 QTC Calculation: 414 R Axis:   76 Text Interpretation:  Sinus bradycardia no change from previous. Confirmed by Charlesetta Shanks [phone]) on 05/04/2018 11:34:42 PM   Radiology Dg Chest 2 View  Result Date: 05/04/2018 CLINICAL DATA:  Weight loss and lethargy. EXAM: CHEST - 2 VIEW COMPARISON:  08/08/2017 FINDINGS: The heart size and mediastinal contours are within normal limits. Both lungs are clear. Stable degenerative change along the dorsal spine. No acute nor suspicious osseous abnormality. No pneumothorax or effusion. IMPRESSION: No active cardiopulmonary disease. Electronically Signed   By: Shanon Brow  Randel Pigg M.D.   On: 05/04/2018 22:38    Procedures Procedures (including critical care time)  Medications Ordered in ED Medications  0.9 %  sodium chloride infusion ( Intravenous New Bag/Given 05/04/18 2201)     Initial Impression / Assessment and Plan / ED Course  I have reviewed the triage vital signs and the nursing notes.  Pertinent labs & imaging results that were available during my care of the patient were reviewed by me and considered in my medical decision making (see chart for details).     Final Clinical Impressions(s) / ED Diagnoses   Final diagnoses:  Failure to thrive in adult   Patient has had chronic psychiatric illness and lives in an assisted living.  His mother temporarily is taking care of him at home because of recent dental procedure.  She is concerned for his lack of eating and how thin he is.  Patient is alert and appropriate.  Vital signs are stable.  Full diagnostic lab work does not indicate any metabolic derangement.  Patient has prior documentation of malnutrition.  I have counseled patient's mother and patient on outpatient evaluations and management.  At this time, patient is stable for discharge with ongoing outpatient  care. ED Discharge Orders    None       Charlesetta Shanks, MD 05/05/18 [phone]

## 2018-05-04 NOTE — ED Triage Notes (Signed)
Pt mother states the pt has been losing weight, feeling lethargic, not eating and drinking since dental surgery 2 days ago. Pt had teeth removed.

## 2018-05-05 ENCOUNTER — Emergency Department (HOSPITAL_COMMUNITY)
Admission: EM | Admit: 2018-05-05 | Discharge: 2018-05-05 | Disposition: A | Discharge status: Home / Self Care | Payer: Medicare Other | Source: Home / Self Care | Attending: Emergency Medicine | Admitting: Emergency Medicine

## 2018-05-05 ENCOUNTER — Encounter (HOSPITAL_COMMUNITY): Payer: Self-pay

## 2018-05-05 DIAGNOSIS — R627 Adult failure to thrive: Secondary | ICD-10-CM | POA: Diagnosis not present

## 2018-05-05 DIAGNOSIS — Z87891 Personal history of nicotine dependence: Secondary | ICD-10-CM | POA: Insufficient documentation

## 2018-05-05 DIAGNOSIS — Z79899 Other long term (current) drug therapy: Secondary | ICD-10-CM | POA: Insufficient documentation

## 2018-05-05 DIAGNOSIS — N3000 Acute cystitis without hematuria: Secondary | ICD-10-CM | POA: Insufficient documentation

## 2018-05-05 DIAGNOSIS — F259 Schizoaffective disorder, unspecified: Secondary | ICD-10-CM

## 2018-05-05 LAB — URINALYSIS, ROUTINE W REFLEX MICROSCOPIC
Bilirubin Urine: NEGATIVE
GLUCOSE, UA: NEGATIVE mg/dL
KETONES UR: 20 mg/dL — AB
Nitrite: NEGATIVE
PH: 5 (ref 5.0–8.0)
Protein, ur: NEGATIVE mg/dL
SPECIFIC GRAVITY, URINE: 1.015 (ref 1.005–1.030)
WBC, UA: >50 WBC/hpf — ABNORMAL HIGH (ref 0–5)

## 2018-05-05 LAB — BASIC METABOLIC PANEL
Anion gap: 12 (ref 5–15)
BUN: 12 mg/dL (ref 6–20)
CALCIUM: 9.8 mg/dL (ref 8.9–10.3)
CO2: 26 mmol/L (ref 22–32)
CREATININE: 0.58 mg/dL — AB (ref 0.61–1.24)
Chloride: 103 mmol/L (ref 98–111)
GFR calc Af Amer: >60 mL/min (ref >60)
GLUCOSE: 95 mg/dL (ref 70–99)
Potassium: 4.2 mmol/L (ref 3.5–5.1)
Sodium: 141 mmol/L (ref 135–145)

## 2018-05-05 LAB — VALPROIC ACID LEVEL: Valproic Acid Lvl: <10 ug/mL — ABNORMAL LOW (ref 50.0–100.0)

## 2018-05-05 MED ORDER — SODIUM CHLORIDE 0.9 % IV SOLN
INTRAVENOUS | Status: DC
  Administered 2018-05-05: 19:00:00 via INTRAVENOUS

## 2018-05-05 MED ORDER — SODIUM CHLORIDE 0.9 % IV BOLUS
500.0000 mL | Freq: Once | INTRAVENOUS | Status: AC
  Administered 2018-05-05: 500 mL via INTRAVENOUS

## 2018-05-05 MED ORDER — CIPROFLOXACIN HCL 500 MG PO TABS: 500.0000 mg | ORAL_TABLET | Freq: Two times a day (BID) | ORAL | 0 refills | Status: DC

## 2018-05-05 MED ORDER — CIPROFLOXACIN HCL 500 MG PO TABS
500.0000 mg | ORAL_TABLET | Freq: Once | ORAL | Status: AC
  Administered 2018-05-05: 500 mg via ORAL
  Filled 2018-05-05: qty 1

## 2018-05-05 NOTE — Discharge Instructions (Addendum)
1.  You should see your family doctor and psychiatrist or counselor as soon as possible to discuss weight loss and medication management.

## 2018-05-05 NOTE — Discharge Instructions (Signed)
Urinalysis with questionable urinary tract infection.  Will start on antibiotics.  Urine culture sent for confirmation.  Take the Cipro antibiotic twice a day for the next 7 days.  No evidence of any significant dehydration.  Follow-up with your doctors in the next few days to have urine rechecked..  Return for any new or worse symptoms.

## 2018-05-05 NOTE — ED Notes (Signed)
Bed: WA17 Expected date:  Expected time:  Means of arrival:  Comments: EMS 

## 2018-05-05 NOTE — ED Notes (Signed)
Pt resting in bed waiting for transport home. Will continue to monitor.

## 2018-05-05 NOTE — ED Triage Notes (Signed)
Pt. Is staying with his mother since having two teeth extracted this Wed. She phoned EMS to have pt. Checked "because he is sleeping too much". Pt. Is also disable d/t "psych. Issues". He arrives in no distress.

## 2018-05-05 NOTE — ED Notes (Signed)
Urine culture sent down with UA. 

## 2018-05-05 NOTE — ED Notes (Signed)
Discharge instructions reviewed with pt. Writer called pt's mother and discussed discharge instructions. Pt's mother verbalized understanding. Pt to follow up with PCP. PIV removed. Pt transported home via [place].

## 2018-05-05 NOTE — ED Provider Notes (Signed)
[facility] DEPT Provider Note   CSN: [id number] Arrival date & time: 05/05/18  1657     History   Chief Complaint Chief Complaint  Patient presents with  . Weakness    HPI Franklin Ayers is a 57 y.o. male.  Patient brought in by EMS.  Patient was evaluated in the emergency department yesterday for same complaint.  Yesterday's visit and labs reviewed.  Patient brought in today for the same concerns.  The concern for today is weakness.  Patient concern yesterday was for losing weight feeling lethargic not eating drinking since he had dental surgery 2 days ago.  Patient had teeth removed.  Patient only states that the group home but can stay there following the dental procedure so his mother has been taking care of him.  Patient's past medical history is significant for schizoaffective disorder with a history of psychosis and catatonia.  He has a known history of malnutrition.  Patient had 2 teeth removed on the upper right side.  The patient denies any complaints.  States he is not able to eat due to the teeth removal.  Not clear how much fluid he has been taking orally.  But he did receive IV fluids when he was seen yesterday.     Past Medical History:  Diagnosis Date  . Anemia   . BPH (benign prostatic hyperplasia)   . 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 Cystitis   . Dysphagia   . Hypokalemia   . Manic depression ([place])   . Mental disorder   . Schizoaffective disorder [facility])     Patient Active Problem List   Diagnosis Date Noted  . 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 Fever of unknown origin 11/16/2017  . Physical deconditioning 11/08/2017  . Benign prostatic hyperplasia   . 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 Hematuria   . Soft tissue infection   . Malnutrition of moderate degree 10/18/2017  . Acute urinary retention   . Anemia   . Hypokalemia   . Bladder distension   . Urinary tract infection with hematuria   .  Altered mental status 10/14/2017  . Schizoaffective disorder ([place]) 12/23/2016    Past Surgical History:  Procedure Laterality Date  . TONSILLECTOMY          Home Medications    Prior to Admission medications   Medication Sig Start Date End Date Taking? Authorizing Provider  acetaminophen (TYLENOL) 325 MG tablet Take 650 mg by mouth every 4 (four) hours as needed.   Yes [provider]  divalproex (DEPAKOTE ER) 500 MG 24 hr tablet Take 1 tablet (500 mg total) by mouth daily with breakfast. 11/06/17  Yes Bonnita Hollow, MD  divalproex (DEPAKOTE ER) 500 MG 24 hr tablet Take 2 tablets (1,000 mg total) by mouth daily with supper. 11/06/17  Yes Bonnita Hollow, MD  LORazepam (ATIVAN) 0.5 MG tablet Take 1 tablet (0.5 mg total) by mouth every 8 (eight) hours. 12/06/17  Yes Gerlene Fee, NP  Multiple Vitamin (MULTIVITAMIN WITH MINERALS) TABS tablet Take 1 tablet by mouth daily.   Yes [provider]  mupirocin cream (BACTROBAN) 2 % Apply topically daily. Apply until resolution of peri-naval wound 11/06/17  Yes Bonnita Hollow, MD  polyethylene glycol powder (MIRALAX) powder Take 17 g by mouth daily.   Yes [provider]  QUEtiapine (SEROQUEL) 400 MG tablet Give 1 tablet (400 mg) by mouth daily at lunch.   Yes [provider]  ciprofloxacin (CIPRO) 500 MG tablet Take 1 tablet (500 mg total) by mouth 2 (two) times daily. 05/05/18   Fredia Sorrow, MD  tamsulosin (FLOMAX) 0.4 MG CAPS capsule Take 2 capsules (0.8 mg total) by mouth daily. Patient taking differently: Take 0.4 mg by mouth daily.  11/06/17   Bonnita Hollow, MD    Family History Family History  Problem Relation Age of Onset  . Cancer Other   . Stroke Other     Social History Social History   Tobacco Use  . Smoking status: Former Smoker    Packs/day: 2.00    Types: Cigarettes  . Smokeless tobacco: Never Used  . Tobacco comment: refused  Substance Use Topics  . Alcohol use: No     Frequency: Never    Comment: occasional  . Drug use: No     Allergies   Penicillins   Review of Systems Review of Systems  Constitutional: Positive for appetite change. Negative for fever.  HENT: Positive for dental problem.   Eyes: Negative for redness.  Respiratory: Negative for shortness of breath.   Cardiovascular: Negative for chest pain.  Gastrointestinal: Negative for abdominal pain.  Genitourinary: Negative for dysuria.  Musculoskeletal: Negative for back pain.  Neurological: Negative for syncope.  Hematological: Does not bruise/bleed easily.     Physical Exam Updated Vital Signs BP 135/64 (BP Location: Right Arm)   Pulse 60   Temp 98.1 F (36.7 C) (Oral)   Resp 14   SpO2 100%   Physical Exam  Constitutional: He appears well-developed. No distress.  Thin  HENT:  Head: Normocephalic and atraumatic.  Mouth/Throat: Oropharynx is clear and moist.  Teeth on the right side upper appear to be healing okay.  Eyes: EOM are normal.  Neck: Neck supple.  Cardiovascular: Normal rate.  Pulmonary/Chest: Effort normal and breath sounds normal. No respiratory distress.  Abdominal: Soft. Bowel sounds are normal. There is no tenderness.  Musculoskeletal: He exhibits no edema.  Neurological: He is alert.  Skin: Skin is warm.  Nursing note and vitals reviewed.    ED Treatments / Results  Labs (all labs ordered are listed, but only abnormal results are displayed) Labs Reviewed  BASIC METABOLIC PANEL - Abnormal; Notable for the following components:      Result Value   Creatinine, Ser 0.58 (*)    All other components within normal limits  URINALYSIS, ROUTINE W REFLEX MICROSCOPIC - Abnormal; Notable for the following components:   APPearance HAZY (*)    Hgb urine dipstick SMALL (*)    Ketones, ur 20 (*)    Leukocytes, UA LARGE (*)    WBC, UA >50 (*)    Bacteria, UA RARE (*)    All other components within normal limits  URINE CULTURE     EKG None  Radiology Dg Chest 2 View  Result Date: 05/04/2018 CLINICAL DATA:  Weight loss and lethargy. EXAM: CHEST - 2 VIEW COMPARISON:  08/08/2017 FINDINGS: The heart size and mediastinal contours are within normal limits. Both lungs are clear. Stable degenerative change along the dorsal spine. No acute nor suspicious osseous abnormality. No pneumothorax or effusion. IMPRESSION: No active cardiopulmonary disease. Electronically Signed   By: Ashley Royalty M.D.   On: 05/04/2018 22:38    Procedures Procedures (including critical care time)  Medications Ordered in ED Medications  0.9 %  sodium chloride infusion ( Intravenous New Bag/Given 05/05/18 1903)  ciprofloxacin (CIPRO) tablet 500 mg (has no administration in time range)  sodium chloride 0.9 %  bolus 500 mL (0 mLs Intravenous Stopped 05/05/18 1908)     Initial Impression / Assessment and Plan / ED Course  I have reviewed the triage vital signs and the nursing notes.  Pertinent labs & imaging results that were available during my care of the patient were reviewed by me and considered in my medical decision making (see chart for details).     Labs from yesterday were reviewed extensive work-up yesterday.  Notes urinalysis was not done.  Patient without any dysuria but urinalysis here today contamination versus urinary tract infection.  Urine culture sent in the meantime will start on Cipro orally first oral dose provided here prescription provided to take for the next 7 days.  Patient's repeat basic metabolic panel without evidence of any dehydration or renal abnormalities or electrolyte abnormalities.  Patient stable for discharge back home.  Patient also was hydrated here with IV fluids.  Patient has received enough fluids in the last 2 days to probably keep him hydrated for the next couple days.  Patient alert seems to be nontoxic no acute distress.  Best I can tell the teeth extraction sites appear to be healing okay.  Final  Clinical Impressions(s) / ED Diagnoses   Final diagnoses:  Acute cystitis without hematuria    ED Discharge Orders         Ordered    ciprofloxacin (CIPRO) 500 MG tablet  2 times daily     05/05/18 2029           Fredia Sorrow, MD 05/05/18 2119

## 2018-05-07 LAB — URINE CULTURE: CULTURE: NO GROWTH

## 2018-05-07 LAB — T3, FREE: T3 FREE: 1.7 pg/mL — AB (ref 2.0–4.4)

## 2018-06-05 ENCOUNTER — Emergency Department (HOSPITAL_COMMUNITY)
Admission: EM | Admit: 2018-06-05 | Discharge: 2018-06-05 | Disposition: A | Discharge status: Home / Self Care | Payer: Medicare Other | Attending: Emergency Medicine | Admitting: Emergency Medicine

## 2018-06-05 ENCOUNTER — Emergency Department (HOSPITAL_COMMUNITY): Payer: Medicare Other

## 2018-06-05 ENCOUNTER — Encounter (HOSPITAL_COMMUNITY): Payer: Self-pay | Admitting: Emergency Medicine

## 2018-06-05 ENCOUNTER — Other Ambulatory Visit: Payer: Self-pay

## 2018-06-05 DIAGNOSIS — E860 Dehydration: Secondary | ICD-10-CM

## 2018-06-05 DIAGNOSIS — I951 Orthostatic hypotension: Secondary | ICD-10-CM | POA: Diagnosis not present

## 2018-06-05 DIAGNOSIS — R55 Syncope and collapse: Secondary | ICD-10-CM | POA: Diagnosis present

## 2018-06-05 DIAGNOSIS — Z87891 Personal history of nicotine dependence: Secondary | ICD-10-CM | POA: Diagnosis not present

## 2018-06-05 DIAGNOSIS — N3000 Acute cystitis without hematuria: Secondary | ICD-10-CM | POA: Insufficient documentation

## 2018-06-05 DIAGNOSIS — W19XXXA Unspecified fall, initial encounter: Secondary | ICD-10-CM

## 2018-06-05 LAB — BASIC METABOLIC PANEL
ANION GAP: 10 (ref 5–15)
BUN: 10 mg/dL (ref 6–20)
CALCIUM: 9 mg/dL (ref 8.9–10.3)
CHLORIDE: 106 mmol/L (ref 98–111)
CO2: 26 mmol/L (ref 22–32)
Creatinine, Ser: 0.66 mg/dL (ref 0.61–1.24)
GFR calc non Af Amer: >60 mL/min (ref >60)
GLUCOSE: 83 mg/dL (ref 70–99)
POTASSIUM: 3.8 mmol/L (ref 3.5–5.1)
Sodium: 142 mmol/L (ref 135–145)

## 2018-06-05 LAB — CBC
HEMATOCRIT: 36.6 % — AB (ref 39.0–52.0)
HEMOGLOBIN: 11.8 g/dL — AB (ref 13.0–17.0)
MCH: 31.3 pg (ref 26.0–34.0)
MCHC: 32.2 g/dL (ref 30.0–36.0)
MCV: 97.1 fL (ref 78.0–100.0)
Platelets: 161 10*3/uL (ref 150–400)
RBC: 3.77 MIL/uL — AB (ref 4.22–5.81)
RDW: 13.8 % (ref 11.5–15.5)
WBC: 6.3 10*3/uL (ref 4.0–10.5)

## 2018-06-05 LAB — URINALYSIS, ROUTINE W REFLEX MICROSCOPIC
Bilirubin Urine: NEGATIVE
Glucose, UA: NEGATIVE mg/dL
HGB URINE DIPSTICK: NEGATIVE
Ketones, ur: 5 mg/dL — AB
Nitrite: NEGATIVE
Protein, ur: NEGATIVE mg/dL
SPECIFIC GRAVITY, URINE: 1.01 (ref 1.005–1.030)
pH: 7 (ref 5.0–8.0)

## 2018-06-05 LAB — CBG MONITORING, ED
GLUCOSE-CAPILLARY: 75 mg/dL (ref 70–99)
Glucose-Capillary: 64 mg/dL — ABNORMAL LOW (ref 70–99)

## 2018-06-05 MED ORDER — CIPROFLOXACIN HCL 500 MG PO TABS: 500.0000 mg | ORAL_TABLET | Freq: Two times a day (BID) | ORAL | 0 refills | Status: DC

## 2018-06-05 MED ORDER — MECLIZINE HCL 25 MG PO TABS: 25.0000 mg | ORAL_TABLET | Freq: Three times a day (TID) | ORAL | 0 refills | Status: DC | PRN

## 2018-06-05 MED ORDER — SODIUM CHLORIDE 0.9 % IV BOLUS
500.0000 mL | Freq: Once | INTRAVENOUS | Status: AC
  Administered 2018-06-05: 500 mL via INTRAVENOUS

## 2018-06-05 MED ORDER — MECLIZINE HCL 25 MG PO TABS
25.0000 mg | ORAL_TABLET | Freq: Once | ORAL | Status: AC
  Administered 2018-06-05: 25 mg via ORAL
  Filled 2018-06-05: qty 1

## 2018-06-05 MED ORDER — SODIUM CHLORIDE 0.9 % IV BOLUS
1000.0000 mL | Freq: Once | INTRAVENOUS | Status: AC
  Administered 2018-06-05: 1000 mL via INTRAVENOUS

## 2018-06-05 MED ORDER — CIPROFLOXACIN HCL 500 MG PO TABS
500.0000 mg | ORAL_TABLET | Freq: Once | ORAL | Status: AC
  Administered 2018-06-05: 500 mg via ORAL
  Filled 2018-06-05: qty 1

## 2018-06-05 NOTE — ED Provider Notes (Signed)
[facility] EMERGENCY DEPARTMENT Provider Note   CSN: [id number] Arrival date & time: 06/05/18  1304     History   Chief Complaint Chief Complaint  Patient presents with  . Near Syncope    HPI Franklin Ayers is a 57 y.o. male.  Franklin Ayers is a 57 y.o. Male with a history of schizoaffective disorder, BPH and anemia, presents to the emergency department via EMS from [facility] assisted living facility.  EMS patient was walking down the hallway at [facility] this morning when he he said the lights "whited out".  And he fell landing on his right side and right knee.  He reports he did not hit his head and had no loss of consciousness.  Patient was alert and oriented for EMS.  They report his initial blood pressure on scene was taken to be 65/47 but on recheck this was 91/57.  Reports his blood pressure is typically on the low side.  He reports he has been feeling lightheaded throughout the morning.  Reports he does not eat or drink much because he has no teeth anymore.  Any chest pain or shortness of breath associated with the fall today.  No abdominal pain, no recent nausea or vomiting, no diarrhea.  Patient denies any fevers or chills, no cough.  Eyes headache, vision changes, numbness or weakness.  Denies any urinary symptoms.  Patient does have a history of tubal UTIs on chart review.  Was given 500 cc bolus of IV fluids during transit. Patient is a poor historian and provides limited information regarding history today.  Spoke with [facility] facility and they reported that before and after fall patient appeared to be at his baseline mental status, no changes in speech noted at facility.     Past Medical History:  Diagnosis Date  . Anemia   . BPH (benign prostatic hyperplasia)   . 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 Cystitis   . Dysphagia   . Hypokalemia   . Manic depression ([place])   . Mental disorder   . Schizoaffective disorder [facility])      Patient Active Problem List   Diagnosis Date Noted  . 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 Fever of unknown origin 11/16/2017  . Physical deconditioning 11/08/2017  . Benign prostatic hyperplasia   . 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 Hematuria   . Soft tissue infection   . Malnutrition of moderate degree 10/18/2017  . Acute urinary retention   . Anemia   . Hypokalemia   . Bladder distension   . Urinary tract infection with hematuria   . Altered mental status 10/14/2017  . Schizoaffective disorder ([place]) 12/23/2016    Past Surgical History:  Procedure Laterality Date  . TONSILLECTOMY          Home Medications    Prior to Admission medications   Medication Sig Start Date End Date Taking? Authorizing Provider  acetaminophen (TYLENOL) 325 MG tablet Take 650 mg by mouth every 4 (four) hours as needed.    [provider]  ciprofloxacin (CIPRO) 500 MG tablet Take 1 tablet (500 mg total) by mouth 2 (two) times daily. 05/05/18   Fredia Sorrow, MD  divalproex (DEPAKOTE ER) 500 MG 24 hr tablet Take 1 tablet (500 mg total) by mouth daily with breakfast. 11/06/17   Bonnita Hollow, MD  divalproex (DEPAKOTE ER) 500 MG 24 hr tablet Take 2 tablets (1,000 mg total) by mouth daily with supper. 11/06/17  Bonnita Hollow, MD  LORazepam (ATIVAN) 0.5 MG tablet Take 1 tablet (0.5 mg total) by mouth every 8 (eight) hours. 12/06/17   Gerlene Fee, NP  Multiple Vitamin (MULTIVITAMIN WITH MINERALS) TABS tablet Take 1 tablet by mouth daily.    [provider]  mupirocin cream (BACTROBAN) 2 % Apply topically daily. Apply until resolution of peri-naval wound 11/06/17   Bonnita Hollow, MD  polyethylene glycol powder (MIRALAX) powder Take 17 g by mouth daily.    [provider]  QUEtiapine (SEROQUEL) 400 MG tablet Give 1 tablet (400 mg) by mouth daily at lunch.    [provider]  tamsulosin (FLOMAX) 0.4 MG CAPS capsule Take 2  capsules (0.8 mg total) by mouth daily. Patient taking differently: Take 0.4 mg by mouth daily.  11/06/17   Bonnita Hollow, MD    Family History Family History  Problem Relation Age of Onset  . Cancer Other   . Stroke Other     Social History Social History   Tobacco Use  . Smoking status: Former Smoker    Packs/day: 2.00    Types: Cigarettes  . Smokeless tobacco: Never Used  . Tobacco comment: refused  Substance Use Topics  . Alcohol use: No    Frequency: Never    Comment: occasional  . Drug use: No     Allergies   Penicillins   Review of Systems Review of Systems  Constitutional: Negative for chills and fever.  HENT: Negative.   Eyes: Negative for visual disturbance.  Respiratory: Negative for cough and shortness of breath.   Cardiovascular: Negative for chest pain.  Gastrointestinal: Negative for abdominal pain and nausea.  Genitourinary: Negative for dysuria and frequency.  Musculoskeletal: Positive for arthralgias. Negative for joint swelling and myalgias.  Skin: Negative for color change and rash.  Neurological: Positive for syncope and light-headedness. Negative for dizziness and headaches.     Physical Exam Updated Vital Signs BP 98/69 (BP Location: Right Arm)   Pulse 61   Temp 97.9 F (36.6 C) (Oral)   Resp 14   SpO2 100%   Physical Exam  Constitutional: He is oriented to person, place, and time. He appears well-developed and well-nourished. No distress.  Patient is very thin and cachectic and appears dehydrated, but is alert and responsive and in no acute distress  HENT:  Head: Normocephalic and atraumatic.  Mouth/Throat: Oropharynx is clear and moist.  Eyes: Pupils are equal, round, and reactive to light. EOM are normal. Right eye exhibits no discharge. Left eye exhibits no discharge.  Neck: Normal range of motion. Neck supple.  C-Spine nontender to palpation, normal ROM  Cardiovascular: Normal rate, regular rhythm, normal heart sounds and  intact distal pulses. Exam reveals no gallop and no friction rub.  No murmur heard. Pulses:      Radial pulses are 2+ on the right side, and 2+ on the left side.       Dorsalis pedis pulses are 2+ on the right side, and 2+ on the left side.       Posterior tibial pulses are 2+ on the right side, and 2+ on the left side.  Pulmonary/Chest: Effort normal and breath sounds normal. No respiratory distress.  Respirations equal and unlabored, patient able to speak in full sentences, lungs clear to auscultation bilaterally, chest NTTP  Abdominal: Soft. Bowel sounds are normal. He exhibits no distension and no mass. There is no tenderness. There is no guarding.  Abdomen soft, nondistended, nontender to palpation  in all quadrants without guarding or peritoneal signs  Neurological: He is alert and oriented to person, place, and time. Coordination normal.  Speech is clear, able to follow commands CN III-XII intact Normal strength in upper and lower extremities bilaterally including dorsiflexion and plantar flexion, strong and equal grip strength Sensation normal to light and sharp touch Moves extremities without ataxia, coordination intact Normal finger to nose and rapid alternating movements No pronator drift  Skin: Skin is warm and dry. He is not diaphoretic.  Psychiatric: He has a normal mood and affect. His behavior is normal.  Nursing note and vitals reviewed.    ED Treatments / Results  Labs (all labs ordered are listed, but only abnormal results are displayed) Labs Reviewed  CBC - Abnormal; Notable for the following components:      Result Value   RBC 3.77 (*)    Hemoglobin 11.8 (*)    HCT 36.6 (*)    All other components within normal limits  URINALYSIS, ROUTINE W REFLEX MICROSCOPIC - Abnormal; Notable for the following components:   APPearance HAZY (*)    Ketones, ur 5 (*)    Leukocytes, UA LARGE (*)    WBC, UA >50 (*)    Bacteria, UA FEW (*)    All other components within normal  limits  CBG MONITORING, ED - Abnormal; Notable for the following components:   Glucose-Capillary <10 (*)    All other components within normal limits  CBG MONITORING, ED - Abnormal; Notable for the following components:   Glucose-Capillary 64 (*)    All other components within normal limits  URINE CULTURE  BASIC METABOLIC PANEL  CBG MONITORING, ED  CBG MONITORING, ED    EKG EKG Interpretation  Date/Time:  Tuesday June 05 2018 13:12:34 EDT Ventricular Rate:  60 PR Interval:  132 QRS Duration: 88 QT Interval:  434 QTC Calculation: 434 R Axis:   80 Text Interpretation:  Normal sinus rhythm Normal ECG No significant change since last tracing Confirmed by Deno Etienne [id number]) on 06/05/2018 1:24:45 PM   Radiology Dg Chest 2 View  Result Date: 06/05/2018 CLINICAL DATA:  Pain after fall. EXAM: CHEST - 2 VIEW COMPARISON:  None. FINDINGS: The heart size and mediastinal contours are within normal limits. Both lungs are clear. AC joint osteoarthritis bilaterally with spurring. Degenerative changes are noted along the dorsal spine anterior osteophytes and mild multilevel disc space narrowing thoracic spine. No aggressive osseous lesions or fracture IMPRESSION: No active cardiopulmonary disease. Electronically Signed   By: Ashley Royalty M.D.   On: 06/05/2018 14:54   Ct Head Wo Contrast  Result Date: 06/05/2018 CLINICAL DATA:  Recent syncopal event EXAM: CT HEAD WITHOUT CONTRAST TECHNIQUE: Contiguous axial images were obtained from the base of the skull through the vertex without intravenous contrast. COMPARISON:  10/22/2017 FINDINGS: Brain: No evidence of acute infarction, hemorrhage, hydrocephalus, extra-axial collection or mass lesion/mass effect. Vascular: No hyperdense vessel or unexpected calcification. Skull: Normal. Negative for fracture or focal lesion. Sinuses/Orbits: Mild mucosal thickening is noted within the right maxillary antrum. Postsurgical changes are also noted in the medial  aspect of the right maxillary antrum. Other: None IMPRESSION: No acute intracranial abnormality noted. Mild mucosal changes within the right maxillary antrum. Electronically Signed   By: Inez Catalina M.D.   On: 06/05/2018 14:39   Dg Knee Complete 4 Views Right  Result Date: 06/05/2018 CLINICAL DATA:  Pain of the right knee after fall this morning. EXAM: RIGHT KNEE - COMPLETE 4+ VIEW COMPARISON:  None. FINDINGS: No evidence of acute fracture, dislocation, or joint effusion. No significant joint space narrowing. Soft tissues are unremarkable. IMPRESSION: No evidence of acute fracture, dislocation, or joint effusion. Electronically Signed   By: Ashley Royalty M.D.   On: 06/05/2018 14:55    Procedures Procedures (including critical care time)  Medications Ordered in ED Medications  sodium chloride 0.9 % bolus 1,000 mL (0 mLs Intravenous Stopped 06/05/18 1644)  sodium chloride 0.9 % bolus 500 mL (0 mLs Intravenous Stopped 06/05/18 1839)  meclizine (ANTIVERT) tablet 25 mg (25 mg Oral Given 06/05/18 1925)  ciprofloxacin (CIPRO) tablet 500 mg (500 mg Oral Given 06/05/18 1940)     Initial Impression / Assessment and Plan / ED Course  I have reviewed the triage vital signs and the nursing notes.  Pertinent labs & imaging results that were available during my care of the patient were reviewed by me and considered in my medical decision making (see chart for details).  Pt presents via EMS for evaluation after fall.  He reported lightheadedness and feeling like the lights went white just before falling, facility reports he did not hit his head but landed on his right side and right knee.  EMS reported low blood pressure initially on arrival, which were improved with recheck.  Was given 500 of fluid in transit.  On arrival vitals are normal and patient appears to be in no acute distress.  He does appear cachectic and very thin and reports he does not eat or drink very much ever since he lost all of his teeth.   Patient appears dehydrated with dry mucous membranes.  No associated chest pain or shortness of breath.  He has normal neurologic exam.  I suspect symptoms are likely due to dehydration causing syncopal event from orthostatic hypotension.  We will get basic labs, urinalysis, x-rays of the chest and right knee and CT head.  Case discussed with Dr. Tyrone Nine who saw and evaluated the patient as well and is in agreement with this plan.  Will give 1 L bolus and reassess.  Labs overall reassuring, no leukocytosis, hemoglobin is stable and at baseline, no acute electrolyte derangements and normal renal function.  Urinalysis pending.  Chest x-ray is clear right knee x-ray shows no evidence of fracture, effusion or dislocation.  CT head shows no acute intracranial abnormality.  1 L fluid patient reports improving lightheadedness but still slightly lightheaded while up and walking around, will give additional 500 cc bolus and 1 dose of meclizine.  Reports he has had similar symptoms like this with some lightheadedness and dizziness intermittently ever since he lost a large amount of weight and stopped eating as well.  This was not an acute change this morning, given normal neurologic exam I have low suspicion for neurologic etiology.  Patient reports improvement after additional fluids and he had steady gait and ambulated to the bathroom without difficulty.  Urinalysis returned and does show signs of infection, urine culture collected, patient has penicillin allergy so will start patient on ciprofloxacin.  As his vitals have remained stable with normal blood pressures here in the emergency department, afebrile, no nausea vomiting and no signs of sepsis.  Feel patient is a good candidate for continued outpatient antibiotic therapy at his skilled nursing facility.  Discharge with Cipro, return precautions discussed.  Patient expresses understanding and is in agreement with this plan.  Patient follow-up with PCP.  Will  contact [facility] for transport back to the facility at this time. Final Clinical  Impressions(s) / ED Diagnoses   Final diagnoses:  Fall, initial encounter  Acute cystitis without hematuria  Orthostatic hypotension  Dehydration    ED Discharge Orders         Ordered    ciprofloxacin (CIPRO) 500 MG tablet  2 times daily     06/05/18 1947    meclizine (ANTIVERT) 25 MG tablet  3 times daily PRN     06/05/18 1947           Jacqlyn Larsen, PA-C 06/06/18 2102    Deno Etienne, DO 06/06/18 2201

## 2018-06-05 NOTE — ED Notes (Signed)
Pt provided with [place] sandwich, applesauce and sprite.

## 2018-06-05 NOTE — ED Notes (Signed)
Pt able to ambulate independently but states he is "dizzy". ED PA made aware, 500cc bolus initiated.

## 2018-06-05 NOTE — Discharge Instructions (Addendum)
You have a urinary tract infection and were dehydrated on arrival.  Your blood pressure has improved significantly.  You will need to take Cipro twice daily for the next 7 days to treat urinary tract infection.  Make sure you are drinking and eating regularly.  Your neurologic exam is reassuring today and I feel that your dizziness is more so likely to your dehydration and infection, you may use meclizine as needed for dizziness.  You will need to follow-up with your primary care doctor to ensure that infection is improving as well as dizziness.  Return for fevers, vomiting, worsening dizziness or headache, falls, confusion or any other new or concerning symptoms.

## 2018-06-05 NOTE — ED Notes (Signed)
Discharge information given to [facility], report attempted to [facility], left call back number.

## 2018-06-05 NOTE — ED Triage Notes (Addendum)
Patient arrived via EMS from [facility]. Per EMS patient was walking down hallway this morning when he said the lights "whited out" and he fell on right side on his right knee. Did not hit his head, no LOC. AOx4. EMS also stated patient's initial BP was 65/47 which then came back up to 91/57. Patient stated he is normally hypotensive. LKN 12pm today. EMS tx: 500cc NS bolus, 20G IV LFA.

## 2018-06-05 NOTE — ED Notes (Signed)
Pt remains in xray.

## 2018-06-05 NOTE — ED Notes (Signed)
Attempted report to [facility], left call back number.

## 2018-06-05 NOTE — ED Notes (Signed)
Patient ambulated in hallway and to the bathroom, no dizziness or SOB reported. Patient tolerated well.

## 2018-06-06 LAB — CBG MONITORING, ED: Glucose-Capillary: 75 mg/dL (ref 70–99)

## 2018-06-07 LAB — URINE CULTURE: Culture: NO GROWTH

## 2018-10-21 ENCOUNTER — Encounter (HOSPITAL_COMMUNITY): Payer: Self-pay

## 2018-10-21 ENCOUNTER — Emergency Department (HOSPITAL_COMMUNITY)
Admission: EM | Admit: 2018-10-21 | Discharge: 2018-10-21 | Disposition: A | Discharge status: Home / Self Care | Payer: Medicare Other | Attending: Emergency Medicine | Admitting: Emergency Medicine

## 2018-10-21 ENCOUNTER — Emergency Department (HOSPITAL_COMMUNITY): Payer: Medicare Other

## 2018-10-21 DIAGNOSIS — Z87891 Personal history of nicotine dependence: Secondary | ICD-10-CM | POA: Diagnosis not present

## 2018-10-21 DIAGNOSIS — F259 Schizoaffective disorder, unspecified: Secondary | ICD-10-CM | POA: Diagnosis not present

## 2018-10-21 DIAGNOSIS — Z79899 Other long term (current) drug therapy: Secondary | ICD-10-CM | POA: Insufficient documentation

## 2018-10-21 DIAGNOSIS — N492 Inflammatory disorders of scrotum: Secondary | ICD-10-CM | POA: Diagnosis not present

## 2018-10-21 DIAGNOSIS — L299 Pruritus, unspecified: Secondary | ICD-10-CM | POA: Diagnosis present

## 2018-10-21 LAB — COMPREHENSIVE METABOLIC PANEL
ALT: 9 U/L (ref 0–44)
AST: 11 U/L — ABNORMAL LOW (ref 15–41)
Albumin: 3.1 g/dL — ABNORMAL LOW (ref 3.5–5.0)
Alkaline Phosphatase: 64 U/L (ref 38–126)
Anion gap: 8 (ref 5–15)
BUN: 14 mg/dL (ref 6–20)
CO2: 26 mmol/L (ref 22–32)
Calcium: 8.7 mg/dL — ABNORMAL LOW (ref 8.9–10.3)
Chloride: 107 mmol/L (ref 98–111)
Creatinine, Ser: 0.55 mg/dL — ABNORMAL LOW (ref 0.61–1.24)
GFR calc non Af Amer: >60 mL/min (ref >60)
Glucose, Bld: 93 mg/dL (ref 70–99)
Potassium: 3.9 mmol/L (ref 3.5–5.1)
Sodium: 141 mmol/L (ref 135–145)
Total Bilirubin: 0.4 mg/dL (ref 0.3–1.2)
Total Protein: 7.3 g/dL (ref 6.5–8.1)

## 2018-10-21 LAB — LACTIC ACID, PLASMA: Lactic Acid, Venous: 1.4 mmol/L (ref 0.5–1.9)

## 2018-10-21 LAB — CBC WITH DIFFERENTIAL/PLATELET
Abs Immature Granulocytes: 0.11 10*3/uL — ABNORMAL HIGH (ref 0.00–0.07)
BASOS ABS: 0.1 10*3/uL (ref 0.0–0.1)
Basophils Relative: 1 %
Eosinophils Absolute: 0.1 10*3/uL (ref 0.0–0.5)
Eosinophils Relative: 1 %
HCT: 35.2 % — ABNORMAL LOW (ref 39.0–52.0)
Hemoglobin: 10.8 g/dL — ABNORMAL LOW (ref 13.0–17.0)
Immature Granulocytes: 1 %
Lymphocytes Relative: 10 %
Lymphs Abs: 1.2 10*3/uL (ref 0.7–4.0)
MCH: 31.9 pg (ref 26.0–34.0)
MCHC: 30.7 g/dL (ref 30.0–36.0)
MCV: 103.8 fL — ABNORMAL HIGH (ref 80.0–100.0)
Monocytes Absolute: 0.8 10*3/uL (ref 0.1–1.0)
Monocytes Relative: 6 %
NEUTROS PCT: 81 %
Neutro Abs: 10.2 10*3/uL — ABNORMAL HIGH (ref 1.7–7.7)
Platelets: 269 10*3/uL (ref 150–400)
RBC: 3.39 MIL/uL — AB (ref 4.22–5.81)
RDW: 13.7 % (ref 11.5–15.5)
WBC: 12.4 10*3/uL — ABNORMAL HIGH (ref 4.0–10.5)
nRBC: 0 % (ref 0.0–0.2)

## 2018-10-21 MED ORDER — LACTATED RINGERS IV BOLUS (SEPSIS)
250.0000 mL | Freq: Once | INTRAVENOUS | Status: AC
  Administered 2018-10-21: 250 mL via INTRAVENOUS

## 2018-10-21 MED ORDER — LACTATED RINGERS IV BOLUS (SEPSIS)
1000.0000 mL | Freq: Once | INTRAVENOUS | Status: AC
  Administered 2018-10-21: 1000 mL via INTRAVENOUS

## 2018-10-21 MED ORDER — VANCOMYCIN HCL IN DEXTROSE 1-5 GM/200ML-% IV SOLN
1000.0000 mg | Freq: Once | INTRAVENOUS | Status: AC
  Administered 2018-10-21: 1000 mg via INTRAVENOUS
  Filled 2018-10-21: qty 200

## 2018-10-21 MED ORDER — LIDOCAINE HCL 2 % IJ SOLN
10.0000 mL | Freq: Once | INTRAMUSCULAR | Status: AC
  Administered 2018-10-21: 200 mg via INTRADERMAL
  Filled 2018-10-21: qty 20

## 2018-10-21 MED ORDER — TRAMADOL HCL 50 MG PO TABS: 50.0000 mg | ORAL_TABLET | Freq: Four times a day (QID) | ORAL | 0 refills | Status: DC | PRN

## 2018-10-21 MED ORDER — IOPAMIDOL (ISOVUE-300) INJECTION 61%
100.0000 mL | Freq: Once | INTRAVENOUS | Status: AC | PRN
  Administered 2018-10-21: 100 mL via INTRAVENOUS

## 2018-10-21 MED ORDER — SULFAMETHOXAZOLE-TRIMETHOPRIM 800-160 MG PO TABS: 1.0000 | ORAL_TABLET | Freq: Two times a day (BID) | ORAL | 0 refills | Status: DC

## 2018-10-21 MED ORDER — LACTATED RINGERS IV BOLUS (SEPSIS)
500.0000 mL | Freq: Once | INTRAVENOUS | Status: AC
  Administered 2018-10-21: 500 mL via INTRAVENOUS

## 2018-10-21 MED ORDER — SULFAMETHOXAZOLE-TRIMETHOPRIM 800-160 MG PO TABS: 1.0000 | ORAL_TABLET | Freq: Two times a day (BID) | ORAL | 0 refills | Status: AC

## 2018-10-21 MED ORDER — SODIUM CHLORIDE 0.9 % IV SOLN
2.0000 g | Freq: Once | INTRAVENOUS | Status: AC
  Administered 2018-10-21: 2 g via INTRAVENOUS
  Filled 2018-10-21: qty 2

## 2018-10-21 NOTE — ED Notes (Signed)
[facility] called for transport.  

## 2018-10-21 NOTE — ED Notes (Signed)
Ultrasound at bedside

## 2018-10-21 NOTE — ED Triage Notes (Addendum)
Patient arrived via [facility] from [facility].   Patient called out 2 days ago for for itching "down there". Today they thought he had a rectal bleed and on assessment realized it was a pressure ulcer on scrotum.   Patient has felt more weak the last couple of days.   450 NS fluids given per ems.   22g L-Hand   CBG-136  100.1 temp per ems 78/48 then 92/60 100% RA C02 29 mmHg HR-88 22-RR

## 2018-10-21 NOTE — ED Provider Notes (Signed)
[facility] DEPT Provider Note   CSN: [id number] Arrival date & time: 10/21/18  1418     History   Chief Complaint Chief Complaint  Patient presents with  . Weakness  . Pressure Ulcer    HPI Franklin Ayers is a 58 y.o. male.  The history is provided by the patient, the EMS personnel and medical records. No language interpreter was used.  Weakness     58 year old male with history of schizoaffective disorder, dysphasia, anemia brought here via EMS for concerns of infection.  Patient complains some drainage coming down his leg earlier today as well as some burning itchy sensation to his perineal region.  States symptoms started a few days prior and is increasing in severity.  He endorsed chills.  Did report some headache 6 days ago which has improved.  He denies having flu symptoms, chest pain, shortness of breath, productive cough, dysuria bowel bladder changes.  The staff noticed leakage coming from his scrotum and patient brought here for further evaluation.  Patient report he was on some type of antibiotic for urinary tract infection but denies any burning sensation when urinating.  He normally walks with a walker.  He does endorse some increased weakness.  Past Medical History:  Diagnosis Date  . Anemia   . BPH (benign prostatic hyperplasia)   . 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 Cystitis   . Dysphagia   . Hypokalemia   . Manic depression ([place])   . Mental disorder   . Schizoaffective disorder [facility])     Patient Active Problem List   Diagnosis Date Noted  . 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 Fever of unknown origin 11/16/2017  . Physical deconditioning 11/08/2017  . Benign prostatic hyperplasia   . 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 Hematuria   . Soft tissue infection   . Malnutrition of moderate degree 10/18/2017  . Acute urinary retention   . Anemia   . Hypokalemia   . Bladder distension   . Urinary tract  infection with hematuria   . Altered mental status 10/14/2017  . Schizoaffective disorder ([place]) 12/23/2016    Past Surgical History:  Procedure Laterality Date  . TONSILLECTOMY          Home Medications    Prior to Admission medications   Medication Sig Start Date End Date Taking? Authorizing Provider  acetaminophen (TYLENOL) 325 MG tablet Take 325-650 mg by mouth See admin instructions. Take 650 mg by mouth three times a day and 325 mg every four hours as needed for pain; CANNOT EXCEED 3,000 MG IN A 24-HR PERIOD    [provider]  ciprofloxacin (CIPRO) 500 MG tablet Take 1 tablet (500 mg total) by mouth 2 (two) times daily. One po bid x 7 days 06/05/18   Jacqlyn Larsen, PA-C  divalproex (DEPAKOTE ER) 500 MG 24 hr tablet Take 1 tablet (500 mg total) by mouth daily with breakfast. Patient taking differently: Take 500-1,000 mg by mouth See admin instructions. Take 500 mg by mouth in the morning and 1,000 mg at bedtime 11/06/17   Bonnita Hollow, MD  divalproex (DEPAKOTE ER) 500 MG 24 hr tablet Take 2 tablets (1,000 mg total) by mouth daily with supper. Patient not taking: Reported on 06/05/2018 11/06/17   Bonnita Hollow, MD  LORazepam (ATIVAN) 0.5 MG tablet Take 1 tablet (0.5 mg total) by mouth every 8 (eight) hours. Patient taking differently: Take 0.5 mg by mouth 3 (three) times daily.  12/06/17   Gerlene Fee, NP  meclizine (ANTIVERT) 25 MG tablet Take 1 tablet (25 mg total) by mouth 3 (three) times daily as needed for dizziness. 06/05/18   Jacqlyn Larsen, PA-C  Multiple Vitamin (MULTIVITAMIN WITH MINERALS) TABS tablet Take 1 tablet by mouth daily.    [provider]  mupirocin cream (BACTROBAN) 2 % Apply topically daily. Apply until resolution of peri-naval wound Patient taking differently: Apply 1 application topically See admin instructions. Apply to affected area/perinaval wound once a day 11/06/17   Bonnita Hollow, MD  OLANZapine (ZYPREXA) 5 MG tablet Take 5  mg by mouth See admin instructions. Take 5 mg by mouth once a day for 14 days/start date 06/01/18 05/31/18   [provider]  polyethylene glycol powder (MIRALAX) powder Take 17 g by mouth daily.    [provider]  QUEtiapine (SEROQUEL) 400 MG tablet Take 400 mg by mouth at bedtime.     [provider]  tamsulosin (FLOMAX) 0.4 MG CAPS capsule Take 2 capsules (0.8 mg total) by mouth daily. 11/06/17   Bonnita Hollow, MD    Family History Family History  Problem Relation Age of Onset  . Cancer Other   . Stroke Other     Social History Social History   Tobacco Use  . Smoking status: Former Smoker    Packs/day: 2.00    Types: Cigarettes  . Smokeless tobacco: Never Used  . Tobacco comment: refused  Substance Use Topics  . Alcohol use: No    Frequency: Never    Comment: occasional  . Drug use: No     Allergies   Penicillins   Review of Systems Review of Systems  Neurological: Positive for weakness.  All other systems reviewed and are negative.    Physical Exam Updated Vital Signs BP 123/85   Pulse (!) 58   Temp 97.6 F (36.4 C) (Oral)   Resp 12   SpO2 99%   Physical Exam Vitals signs and nursing note reviewed. Exam conducted with a chaperone present.  Constitutional:      General: He is not in acute distress.    Appearance: He is well-developed.     Comments: Cachectic appearing male older than stated age appears to be in no acute discomfort.  HENT:     Head: Atraumatic.  Eyes:     Conjunctiva/sclera: Conjunctivae normal.  Neck:     Musculoskeletal: Neck supple. No neck rigidity.  Cardiovascular:     Rate and Rhythm: Normal rate and regular rhythm.     Pulses: Normal pulses.     Heart sounds: Normal heart sounds.  Pulmonary:     Breath sounds: No wheezing, rhonchi or rales.  Abdominal:     General: Abdomen is flat.     Palpations: Abdomen is soft.     Tenderness: There is no abdominal tenderness.  Genitourinary:     Comments: Circumcised penis free of lesion or rash.  Scrotum is edematous, erythematous, actively oozing out copious amount of purulent discharge and tender to palpation.  Perineum soft.  Left testicle is indurated and enlarged. Skin:    Findings: No rash.  Neurological:     Mental Status: He is alert.      ED Treatments / Results  Labs (all labs ordered are listed, but only abnormal results are displayed) Labs Reviewed  COMPREHENSIVE METABOLIC PANEL - Abnormal; Notable for the following components:      Result Value   Creatinine, Ser 0.55 (*)  Calcium 8.7 (*)    Albumin 3.1 (*)    AST 11 (*)    All other components within normal limits  CBC WITH DIFFERENTIAL/PLATELET - Abnormal; Notable for the following components:   WBC 12.4 (*)    RBC 3.39 (*)    Hemoglobin 10.8 (*)    HCT 35.2 (*)    MCV 103.8 (*)    Neutro Abs 10.2 (*)    Abs Immature Granulocytes 0.11 (*)    All other components within normal limits  CULTURE, BLOOD (ROUTINE X 2)  CULTURE, BLOOD (ROUTINE X 2)  LACTIC ACID, PLASMA  URINALYSIS, ROUTINE W REFLEX MICROSCOPIC    EKG None  Radiology Ct Abdomen Pelvis W Contrast  Result Date: 10/21/2018 CLINICAL DATA:  Open wound at scrotum, scrotal abscess question Fournier's gangrene EXAM: CT ABDOMEN AND PELVIS WITH CONTRAST TECHNIQUE: Multidetector CT imaging of the abdomen and pelvis was performed using the standard protocol following bolus administration of intravenous contrast. Sagittal and coronal MPR images reconstructed from axial data set. CONTRAST:  148mL ISOVUE-300 IOPAMIDOL (ISOVUE-300) INJECTION 61% IV. No oral contrast. COMPARISON:  10/21/2017 FINDINGS: Lower chest: Dependent atelectasis at lung bases, mild Hepatobiliary: Contracted gallbladder.  Fatty infiltration of liver. Pancreas: Normal appearance Spleen: Normal appearance Adrenals/Urinary Tract: Adrenal glands normal appearance. Kidneys, ureters, and bladder normal appearance Stomach/Bowel: Normal  appendix. Stomach and bowel loops normal appearance. Vascular/Lymphatic: Atherosclerotic calcifications aorta. Aorta normal caliber. Normal sized retroperitoneal, inguinal and pelvic nodes. Minimally enlarged LEFT external iliac nodes 11 mm short axis image 69 and 11 mm image 73. Reproductive: Minimal prostatic enlargement, gland measuring 4.0 x 3.2 cm image 77. Extensive soft tissue thickening and infiltration within the scrotum with several fluid collections with enhancing margins consistent with abscesses. These include a 31 x 16 x 29 mm multiloculated collection image 94 at midline and a more inferior likely LEFT gas and fluid collection 49 x 16 x 18 mm image 98. Gas is seen within several of the collections and as well as within the scrotum. Findings are suspicious for abscesses and Fournier's gangrene. No perineal extension of gas. Scattered soft tissue edema does extend from the base of the scrotum into the perineum however. Mild asymmetric enhancement of LEFT seminal vesicle versus RIGHT Other: No free intraperitoneal air or fluid. Musculoskeletal: No acute osseous findings. IMPRESSION: Extensive soft tissue swelling and edema within the scrotum extending into the perineum. Several fluid collections with enhancing margins and small amount of gas are identified within the scrotum consistent with abscesses measuring up to 4.9 cm and 3.1 cm in greatest sizes. Multiple foci of soft tissue gas are seen within the scrotum suspicious for Fournier's gangrene. Patient reportedly has an open wound, uncertain if there is a relationship between the wound and the visualized soft tissue gas. Findings called to Domenic Moras PA in ED on 10/21/2018 at 1943 hrs. Electronically Signed   By: Lavonia Dana M.D.   On: 10/21/2018 19:44   US Scrotum W/doppler  Result Date: 10/21/2018 CLINICAL DATA:  Patient with possible scrotal abscess. EXAM: SCROTAL ULTRASOUND DOPPLER ULTRASOUND OF THE TESTICLES TECHNIQUE: Complete ultrasound  examination of the testicles, epididymis, and other scrotal structures was performed. Color and spectral Doppler ultrasound were also utilized to evaluate blood flow to the testicles. COMPARISON:  None. FINDINGS: Right testicle Measurements: 4.0 x 2.3 x 2.5 cm. No mass or microlithiasis visualized. Small cyst within the right testicle. Left testicle Measurements: 4.5 x 2.5 x 3.2 cm. Within the left testicle there is a 3.6 x  2.1 cm mixed echogenicity masslike structure with internal color vascularity and associated cystic spaces. Right epididymis:  Normal in size and appearance. Left epididymis:  Thickened with mild increased vascularity. Hydrocele:  Small amount of fluid within the left hemiscrotum. Varicocele:  None visualized. Pulsed Doppler interrogation of both testes demonstrates normal low resistance arterial and venous waveforms bilaterally. Marked scrotal wall thickening. Multiple foci of echogenicity with posterior acoustic shadowing within the scrotum compatible with gas. IMPRESSION: There is a large mixed echogenicity structure within the left testicle concerning for intratesticular abscess given clinical findings. There is marked thickening of the overlying scrotum compatible with associated infectious process. Additionally, there appears to be gas within the overlying scrotum. Recommend further evaluation with noncontrast enhanced chest CT to delineate extent of the infection/gas and evaluate for Fournier's gangrene. Recommend continued clinical/imaging follow-up of this process to ensure complete resolution and exclude the possibility of underlying testicular neoplasm. Electronically Signed   By: Lovey Newcomer M.D.   On: 10/21/2018 18:29    Procedures .Marland KitchenIncision and Drainage Date/Time: 10/21/2018 3:47 PM Performed by: Domenic Moras, PA-C Authorized by: Domenic Moras, PA-C   Consent:    Consent obtained:  Verbal   Consent given by:  Patient   Risks discussed:  Bleeding, incomplete drainage, pain and  damage to other organs   Alternatives discussed:  No treatment Universal protocol:    Procedure explained and questions answered to patient or proxy's satisfaction: yes     Relevant documents present and verified: yes     Test results available and properly labeled: yes     Imaging studies available: yes     Required blood products, implants, devices, and special equipment available: yes     Site/side marked: yes     Immediately prior to procedure a time out was called: yes     Patient identity confirmed:  Verbally with patient Location:    Type:  Abscess   Size:  5cm   Location:  Anogenital   Anogenital location:  Scrotal space Pre-procedure details:    Skin preparation:  Betadine Anesthesia (see MAR for exact dosages):    Anesthesia method:  Local infiltration   Local anesthetic:  Lidocaine 2% w/o epi Procedure type:    Complexity:  Complex Procedure details:    Scalpel blade:  11   Drainage:  Purulent   Drainage amount:  Moderate   Packing materials:  1/4 in gauze Post-procedure details:    Patient tolerance of procedure:  Tolerated well, no immediate complications   (including critical care time)  Medications Ordered in ED Medications  vancomycin (VANCOCIN) IVPB 1000 mg/200 mL premix (0 mg Intravenous Stopped 10/21/18 1806)  aztreonam (AZACTAM) 2 g in sodium chloride 0.9 % 100 mL IVPB (0 g Intravenous Stopped 10/21/18 1646)  lactated ringers bolus 1,000 mL (0 mLs Intravenous Stopped 10/21/18 1712)    And  lactated ringers bolus 500 mL (0 mLs Intravenous Stopped 10/21/18 1712)    And  lactated ringers bolus 250 mL (0 mLs Intravenous Stopped 10/21/18 1809)  iopamidol (ISOVUE-300) 61 % injection 100 mL (100 mLs Intravenous Contrast Given 10/21/18 1916)  lidocaine (XYLOCAINE) 2 % (with pres) injection 200 mg (200 mg Intradermal Given 10/21/18 2027)     Initial Impression / Assessment and Plan / ED Course  I have reviewed the triage vital signs and the nursing notes.  Pertinent  labs & imaging results that were available during my care of the patient were reviewed by me and considered in my medical decision  making (see chart for details).     BP 123/85   Pulse (!) 58   Temp 97.6 F (36.4 C) (Oral)   Resp 12   SpO2 99%    Final Clinical Impressions(s) / ED Diagnoses   Final diagnoses:  Scrotal abscess    ED Discharge Orders         Ordered    sulfamethoxazole-trimethoprim (BACTRIM DS,SEPTRA DS) 800-160 MG tablet  2 times daily     10/21/18 2212    traMADol (ULTRAM) 50 MG tablet  Every 6 hours PRN     10/21/18 2212         3:46 PM Patient here with large cutaneous abscess involving his scrotum actively oozing purulent discharge.  I was able to apply pressure.  Moderate amount of discharge from it.  Concern for potential sepsis therefore work-up initiated will obtain ultrasound scrotum.  Pain medication offered but patient declined.  Will initiate antibiotic.  6:39 PM Initial lactic acid is 1.4, electrolyte panels are reassuring, mild elevated white count of 12.4, scrotal ultrasound showing large mixed echogenicity structure within the left testicle concerning for intratesticular abscess given clinical findings.  There is mild thickening of the overlying scrotum compatible with associated infectious process.  There appears to be gas within the lower scrotum and the radiologist recommend CT scan to evaluate for potential Fourniere's Gangrene  8:07 PM CT scan shows extensive soft tissue swelling and edema with multiple foci of soft tisue gas suspicious for Fournier's gangrene.  I did discussed this with urologist DR. Eskridge who will see pt in the ER. I have low suspicion for Fournier's gangrene as pt is non toxic, afebrile, normal lactic acid and mildly elevated WBC of 12.  Suspect gas from open abscess.  8:51 PM  Urologist Dr. Junious Silk did evaluate pt in the ER and felt this is not likely Fournier's gangrene. I performed I&D with the help of Dr.  Junious Silk.  Moderate packing placed.  Pt will f/u in office in the next few days for further care. Pt tolerate procedure well.    Domenic Moras, PA-C 10/21/18 2217    Quintella Reichert, MD 10/23/18 1021

## 2018-10-21 NOTE — ED Notes (Signed)
Encouraged patient to void. Explained to patient if unable to void we will need to place condom catheter or in/out. Patient refusing and states he can use urinal on his own.

## 2018-10-21 NOTE — Consult Note (Signed)
Consultation: Scrotal abscess Requested by: Dr. Quintella Reichert  History of Present Illness: Mr. Testa is a 58 year old white male with a history of schizoaffective disorder, BPH and anemia, presents to the emergency department via EMS from [facility] assisted living facility with a several day history of scrotal swelling which began to drain today.  Ultrasound revealed swelling of the left testicle and epididymis consistent with likely epididymoorchitis but also multiple areas of air consistent with an abscess cavity but possible Fournier's gangrene.  Therefore a CT scan of the abdomen and pelvis was obtained which showed no lymphadenopathy, minimal prostatic enlargement, soft tissue thickening and infiltration of the scrotum with several fluid collections with enhancing margins consistent with abscess.  This measured 3.1 x 2.9 cm.  Gas was seen within these collections in the scrotum.  The patient white count is only 12, his creatinine 0.55 and lactic acid was normal at 1.4.  Also has had no fever and he has no tachycardia.  Past Medical History:  Diagnosis Date  . Anemia   . BPH (benign prostatic hyperplasia)   . 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 Cystitis   . Dysphagia   . Hypokalemia   . Manic depression ([place])   . Mental disorder   . Schizoaffective disorder [facility])    Past Surgical History:  Procedure Laterality Date  . TONSILLECTOMY      Home Medications:  (Not in a hospital admission)  Allergies:  Allergies  Allergen Reactions  . Penicillins Shortness Of Breath    .Has patient had a PCN reaction causing immediate rash, facial/tongue/throat swelling, SOB or lightheadedness with hypotension: Yes Has patient had a PCN reaction causing severe rash involving mucus membranes or skin necrosis: yes Has patient had a PCN reaction that required hospitalization: no Has patient had a PCN reaction occurring within the last 10 years: no If all of the above answers are "NO", then may  proceed with Cephalosporin use.     Family History  Problem Relation Age of Onset  . Cancer Other   . Stroke Other    Social History:  reports that he has quit smoking. His smoking use included cigarettes. He smoked 2.00 packs per day. He has never used smokeless tobacco. He reports that he does not drink alcohol or use drugs.  ROS: A complete review of systems was performed.  All systems are negative except for pertinent findings as noted. Review of Systems  All other systems reviewed and are negative.    Physical Exam:  Vital signs in last 24 hours: Temp:  [97.6 F (36.4 C)] 97.6 F (36.4 C) (02/09 1522) Pulse Rate:  [49-73] 63 (02/09 2130) Resp:  [10-20] 20 (02/09 2130) BP: (90-128)/(61-85) 121/67 (02/09 2130) SpO2:  [97 %-100 %] 100 % (02/09 2130) General:  Alert and oriented, No acute distress HEENT: Normocephalic, atraumatic Cardiovascular: Regular rate and rhythm Lungs: Regular rate and effort Abdomen: Soft, nontender, nondistended, no abdominal masses Back: No CVA tenderness Extremities: No edema Neurologic: Grossly intact GU: Patient was examined with PA Rona Ravens and a CNA.  The left scrotum had a moderate side fluctuant cavity consistent with left abscess and it was draining pus through a spontaneous opening.  The scrotal skin was healthy just mild erythema consistent with the underlying infection.  The testicle and the epididymis were slightly enlarged but nontender.  There was no significant induration.  The right hemiscrotum, testicle and epididymis were palpably normal.  The penis was circumcised and without mass or lesion.  Procedure: See  PA Tran's I&D note.  I assisted.  He made a good 3 cm incision and we were able to probe superiorly and inferiorly with the hemostat to ensure all of the cavities were drained. Much of the drainage was spontaneous and already occurred. We packed it with iodoform gauze inferiorly and superiorly and left a long tail.  Laboratory Data:   Results for orders placed or performed during the hospital encounter of 10/21/18 (from the past 24 hour(s))  Lactic acid, plasma     Status: None   Collection Time: 10/21/18  3:40 PM  Result Value Ref Range   Lactic Acid, Venous 1.4 0.5 - 1.9 mmol/L  Comprehensive metabolic panel     Status: Abnormal   Collection Time: 10/21/18  3:40 PM  Result Value Ref Range   Sodium 141 135 - 145 mmol/L   Potassium 3.9 3.5 - 5.1 mmol/L   Chloride 107 98 - 111 mmol/L   CO2 26 22 - 32 mmol/L   Glucose, Bld 93 70 - 99 mg/dL   BUN 14 6 - 20 mg/dL   Creatinine, Ser 0.55 (L) 0.61 - 1.24 mg/dL   Calcium 8.7 (L) 8.9 - 10.3 mg/dL   Total Protein 7.3 6.5 - 8.1 g/dL   Albumin 3.1 (L) 3.5 - 5.0 g/dL   AST 11 (L) 15 - 41 U/L   ALT 9 0 - 44 U/L   Alkaline Phosphatase 64 38 - 126 U/L   Total Bilirubin 0.4 0.3 - 1.2 mg/dL   GFR calc non Af Amer >60 >60 mL/min   GFR calc Af Amer >60 >60 mL/min   Anion gap 8 5 - 15  CBC WITH DIFFERENTIAL     Status: Abnormal   Collection Time: 10/21/18  3:40 PM  Result Value Ref Range   WBC 12.4 (H) 4.0 - 10.5 K/uL   RBC 3.39 (L) 4.22 - 5.81 MIL/uL   Hemoglobin 10.8 (L) 13.0 - 17.0 g/dL   HCT 35.2 (L) 39.0 - 52.0 %   MCV 103.8 (H) 80.0 - 100.0 fL   MCH 31.9 26.0 - 34.0 pg   MCHC 30.7 30.0 - 36.0 g/dL   RDW 13.7 11.5 - 15.5 %   Platelets 269 150 - 400 K/uL   nRBC 0.0 0.0 - 0.2 %   Neutrophils Relative % 81 %   Neutro Abs 10.2 (H) 1.7 - 7.7 K/uL   Lymphocytes Relative 10 %   Lymphs Abs 1.2 0.7 - 4.0 K/uL   Monocytes Relative 6 %   Monocytes Absolute 0.8 0.1 - 1.0 K/uL   Eosinophils Relative 1 %   Eosinophils Absolute 0.1 0.0 - 0.5 K/uL   Basophils Relative 1 %   Basophils Absolute 0.1 0.0 - 0.1 K/uL   Immature Granulocytes 1 %   Abs Immature Granulocytes 0.11 (H) 0.00 - 0.07 K/uL   No results found for this or any previous visit (from the past 240 hour(s)). Creatinine: Recent Labs    10/21/18 1540  CREATININE 0.55*    Impression/Assessment:  Scrotal  abscess-the area noted on the ultrasound and CT were simply the abscess cavities and the cavity was spontaneously draining.  Plan:  Wound care.  PA Rona Ravens will discharge on 2 antibiotics to cover MRSA and gram-negative's.  I will arrange for the patient to come back to urology office in about 36 hours for dressing change.  Festus Aloe 10/21/2018, 9:59 PM

## 2018-10-21 NOTE — Progress Notes (Signed)
A consult was received from an ED physician for aztreonam and vancomycin per pharmacy dosing.  The patient's profile has been reviewed for ht/wt/allergies/indication/available labs.   A one time order has been placed for aztreonam 2 g and vancomycin 1000 mg iv once.  Further antibiotics/pharmacy consults should be ordered by admitting physician if indicated.                       Thank you, Napoleon Form 10/21/2018  3:42 PM

## 2018-10-21 NOTE — Discharge Instructions (Addendum)
You have scrotal abscess.  Please take pain medication and antibiotic as prescribed.  Call and follow up with urologist in 2-3 days for packing removal and reassessment of your infection.

## 2018-10-21 NOTE — ED Notes (Signed)
Pt made aware again that urine sample still needed

## 2018-10-21 NOTE — ED Notes (Signed)
PA at bedside.

## 2018-10-22 ENCOUNTER — Telehealth (HOSPITAL_BASED_OUTPATIENT_CLINIC_OR_DEPARTMENT_OTHER): Payer: Self-pay | Admitting: Emergency Medicine

## 2018-10-22 LAB — BLOOD CULTURE ID PANEL (REFLEXED)
Acinetobacter baumannii: NOT DETECTED
Candida albicans: NOT DETECTED
Candida glabrata: NOT DETECTED
Candida krusei: NOT DETECTED
Candida parapsilosis: NOT DETECTED
Candida tropicalis: NOT DETECTED
Enterobacter cloacae complex: NOT DETECTED
Enterobacteriaceae species: NOT DETECTED
Enterococcus species: NOT DETECTED
Escherichia coli: NOT DETECTED
Haemophilus influenzae: NOT DETECTED
Klebsiella oxytoca: NOT DETECTED
Klebsiella pneumoniae: NOT DETECTED
Listeria monocytogenes: NOT DETECTED
Methicillin resistance: NOT DETECTED
Neisseria meningitidis: NOT DETECTED
PSEUDOMONAS AERUGINOSA: NOT DETECTED
Proteus species: NOT DETECTED
SERRATIA MARCESCENS: NOT DETECTED
Staphylococcus aureus (BCID): NOT DETECTED
Staphylococcus species: DETECTED — AB
Streptococcus agalactiae: NOT DETECTED
Streptococcus pneumoniae: NOT DETECTED
Streptococcus pyogenes: NOT DETECTED
Streptococcus species: NOT DETECTED

## 2018-10-24 LAB — CULTURE, BLOOD (ROUTINE X 2): Special Requests: ADEQUATE

## 2018-10-25 ENCOUNTER — Telehealth: Payer: Self-pay | Admitting: *Deleted

## 2018-10-25 NOTE — Telephone Encounter (Signed)
Post ED Visit - Positive Culture Follow-up  Culture report reviewed by antimicrobial stewardship pharmacist:  []  Elenor Quinones, Pharm.D. []  Heide Guile, Pharm.D., BCPS AQ-ID []  Parks Neptune, Pharm.D., BCPS []  Alycia Rossetti, Pharm.D., BCPS []  [place], Pharm.D., BCPS, AAHIVP []  Legrand Como, Pharm.D., BCPS, AAHIVP []  Salome Arnt, PharmD, BCPS []  Johnnette Gourd, PharmD, BCPS []  Hughes Better, PharmD, BCPS []  Leeroy Cha, PharmD Laqueta Linden, PharmD  Positive urine culture Treated with Sulfamethoxazole, Trimethoprim, organism sensitive to the same and no further patient follow-up is required at this time.  Harlon [facility] 10/25/2018, 11:42 AM

## 2018-10-26 LAB — CULTURE, BLOOD (ROUTINE X 2)
Culture: NO GROWTH
Special Requests: ADEQUATE

## 2019-06-15 ENCOUNTER — Other Ambulatory Visit: Payer: Self-pay

## 2019-06-15 ENCOUNTER — Emergency Department (HOSPITAL_COMMUNITY): Payer: Medicare Other

## 2019-06-15 ENCOUNTER — Emergency Department (HOSPITAL_COMMUNITY)
Admission: EM | Admit: 2019-06-15 | Discharge: 2019-06-15 | Disposition: A | Discharge status: Skilled Nursing Facility | Payer: Medicare Other | Attending: Emergency Medicine | Admitting: Emergency Medicine

## 2019-06-15 ENCOUNTER — Encounter (HOSPITAL_COMMUNITY): Payer: Self-pay | Admitting: Emergency Medicine

## 2019-06-15 DIAGNOSIS — R509 Fever, unspecified: Secondary | ICD-10-CM | POA: Diagnosis not present

## 2019-06-15 DIAGNOSIS — Z87891 Personal history of nicotine dependence: Secondary | ICD-10-CM | POA: Diagnosis not present

## 2019-06-15 DIAGNOSIS — Z20828 Contact with and (suspected) exposure to other viral communicable diseases: Secondary | ICD-10-CM | POA: Diagnosis not present

## 2019-06-15 LAB — COMPREHENSIVE METABOLIC PANEL
ALT: 13 U/L (ref 0–44)
AST: 14 U/L — ABNORMAL LOW (ref 15–41)
Albumin: 3.8 g/dL (ref 3.5–5.0)
Alkaline Phosphatase: 62 U/L (ref 38–126)
Anion gap: 10 (ref 5–15)
BUN: 14 mg/dL (ref 6–20)
CO2: 24 mmol/L (ref 22–32)
Calcium: 9.1 mg/dL (ref 8.9–10.3)
Chloride: 106 mmol/L (ref 98–111)
Creatinine, Ser: 0.56 mg/dL — ABNORMAL LOW (ref 0.61–1.24)
GFR calc Af Amer: >60 mL/min (ref >60)
GFR calc non Af Amer: >60 mL/min (ref >60)
Glucose, Bld: 82 mg/dL (ref 70–99)
Potassium: 3.6 mmol/L (ref 3.5–5.1)
Sodium: 140 mmol/L (ref 135–145)
Total Bilirubin: 0.7 mg/dL (ref 0.3–1.2)
Total Protein: 6.7 g/dL (ref 6.5–8.1)

## 2019-06-15 LAB — CBC WITH DIFFERENTIAL/PLATELET
Abs Immature Granulocytes: 0.01 10*3/uL (ref 0.00–0.07)
Basophils Absolute: 0.1 10*3/uL (ref 0.0–0.1)
Basophils Relative: 2 %
Eosinophils Absolute: 0.2 10*3/uL (ref 0.0–0.5)
Eosinophils Relative: 3 %
HCT: 35.9 % — ABNORMAL LOW (ref 39.0–52.0)
Hemoglobin: 11.5 g/dL — ABNORMAL LOW (ref 13.0–17.0)
Immature Granulocytes: 0 %
Lymphocytes Relative: 43 %
Lymphs Abs: 2.2 10*3/uL (ref 0.7–4.0)
MCH: 31.5 pg (ref 26.0–34.0)
MCHC: 32 g/dL (ref 30.0–36.0)
MCV: 98.4 fL (ref 80.0–100.0)
Monocytes Absolute: 0.4 10*3/uL (ref 0.1–1.0)
Monocytes Relative: 8 %
Neutro Abs: 2.2 10*3/uL (ref 1.7–7.7)
Neutrophils Relative %: 44 %
Platelets: 226 10*3/uL (ref 150–400)
RBC: 3.65 MIL/uL — ABNORMAL LOW (ref 4.22–5.81)
RDW: 13.8 % (ref 11.5–15.5)
WBC: 5.1 10*3/uL (ref 4.0–10.5)
nRBC: 0 % (ref 0.0–0.2)

## 2019-06-15 LAB — SARS CORONAVIRUS 2 BY RT PCR (HOSPITAL ORDER, PERFORMED IN ~~LOC~~ HOSPITAL LAB): SARS Coronavirus 2: NEGATIVE

## 2019-06-15 LAB — URINALYSIS, ROUTINE W REFLEX MICROSCOPIC
Bacteria, UA: NONE SEEN
Bilirubin Urine: NEGATIVE
Glucose, UA: NEGATIVE mg/dL
Hgb urine dipstick: NEGATIVE
Ketones, ur: 5 mg/dL — AB
Nitrite: NEGATIVE
Protein, ur: NEGATIVE mg/dL
Specific Gravity, Urine: 1.02 (ref 1.005–1.030)
pH: 6 (ref 5.0–8.0)

## 2019-06-15 MED ORDER — ACETAMINOPHEN 500 MG PO TABS
1000.0000 mg | ORAL_TABLET | Freq: Once | ORAL | Status: AC
  Administered 2019-06-15: 14:00:00 1000 mg via ORAL
  Filled 2019-06-15: qty 2

## 2019-06-15 NOTE — ED Provider Notes (Signed)
  Physical Exam  BP 114/70   Pulse 60   Temp 98.2 F (36.8 C) (Oral)   Resp 14   SpO2 99%   Physical Exam  ED Course/Procedures   Clinical Course as of Jun 15 1823  Sat Jun 15, 2019  1823 Chalmers Guest): MODERATE [JS]  [id number] WBC, UA: 11-20 [JS]    Clinical Course User Index [JS] Janeece Fitting, PA-C    Procedures  MDM  Patient care assumed from [employer] PA-C at shift change, please see her note for a full HPI. Briefly, patient here from [facility], does have a prior extensive psychiatric history.  Patient has been tested positive for COVID.  Came into the ED for further evaluation as there is a current outbreak at [facility]. CBC, CMP, chest x-ray without any acute process.  Plan was to follow-up for UA and likely discharge home.  UA showed moderate leukocytes, white blood cell count 11-20.  Urine will be sent for culture, will defer antibiotic treatment at this time.  Patient stable for discharge at this time, discussed results with patient.  Patient understands and agrees with management, return precautions provided at length.   Portions of this note were generated with [employer]. Dictation errors may occur despite best attempts at proofreading.     Janeece Fitting, PA-C 06/15/19 1825    Blanchie Dessert, MD 06/15/19 2302

## 2019-06-15 NOTE — Discharge Instructions (Addendum)
Your COVID test was negative.   Please follow up with your primary care provider within 5-7 days for re-evaluation of your symptoms. If you do not have a primary care provider, information for a healthcare clinic has been provided for you to make arrangements for follow up care. Please return to the emergency department for any new or worsening symptoms.

## 2019-06-15 NOTE — ED Notes (Signed)
Facility reports McCray MD came to the facility to covid swab patients yesterday. Results are still pending.

## 2019-06-15 NOTE — ED Provider Notes (Signed)
[facility] DEPT Provider Note   CSN: [id number] Arrival date & time: 06/15/19  1301     History   Chief Complaint Chief Complaint  Patient presents with  . Fever    HPI Franklin Ayers is a 58 y.o. male.     HPI   Patient is a 58 year old male with a history of anemia, BPH, cystitis, schizophrenia, manic depression, schizoaffective disorder, who presents the emergency department today for evaluation of fever.  Patient is coming from [facility] where there reportedly is an outbreak of COVID-19.  Due to patient's underlying psychiatric illness, history is not felt to be completely reliable therefore there is a level 5 caveat for this.  The patient denies any cough, chest pain, shortness of breath, abdominal pain, nausea, vomiting.  He does complain of some diarrhea.  States he has a history of chronic urinary incontinence since he had a GU surgery.  Reportedly, patient has been tested for COVID but has not received the results yet.  1:33 PM Discussed case with nurse at [facility]. She states pt was tested for COVID yesterday and they do not have the results. They were concerned about him to day because they said he had a fever, mild cough, and was not wanting to eat today.   Past Medical History:  Diagnosis Date  . Anemia   . BPH (benign prostatic hyperplasia)   . 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 Cystitis   . Dysphagia   . Hypokalemia   . Manic depression ([place])   . Mental disorder   . Schizoaffective disorder [facility])     Patient Active Problem List   Diagnosis Date Noted  . 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 Fever of unknown origin 11/16/2017  . Physical deconditioning 11/08/2017  . Benign prostatic hyperplasia   . 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 Hematuria   . Soft tissue infection   . Malnutrition of moderate degree 10/18/2017  . Acute urinary retention   . Anemia   . Hypokalemia   . Bladder  distension   . Urinary tract infection with hematuria   . Altered mental status 10/14/2017  . Schizoaffective disorder ([place]) 12/23/2016    Past Surgical History:  Procedure Laterality Date  . TONSILLECTOMY          Home Medications    Prior to Admission medications   Medication Sig Start Date End Date Taking? Authorizing Provider  acetaminophen (TYLENOL) 325 MG tablet Take 650 mg by mouth 3 (three) times daily. Can take an additional two tablets every 4 hours as needed for pain  - CANNOT EXCEED 3,000 MG IN A 24-HR PERIOD    [provider]  alum & mag hydroxide-simeth (MI-ACID) 200-200-20 MG/5ML suspension Take 30 mLs by mouth as needed for indigestion or heartburn. Not to exceed 4 doses in 24 hours.    [provider]  divalproex (DEPAKOTE ER) 500 MG 24 hr tablet Take 1 tablet (500 mg total) by mouth daily with breakfast. Patient taking differently: Take 500-1,000 mg by mouth See admin instructions. Take 500 mg by mouth in the morning and 1,000 mg at bedtime 11/06/17   Bonnita Hollow, MD  loperamide (IMODIUM) 2 MG capsule Take 2 mg by mouth as needed for diarrhea or loose stools (No more than 8 doses in 24 hours.).    [provider]  LORazepam (ATIVAN) 0.5 MG tablet Take 1 tablet (0.5 mg total) by mouth every 8 (eight) hours. Patient taking  differently: Take 0.5 mg by mouth 3 (three) times daily.  12/06/17   Gerlene Fee, NP  magnesium hydroxide (MILK OF MAGNESIA) 400 MG/5ML suspension Take 30 mLs by mouth at bedtime as needed (For constipation.).    [provider]  meclizine (ANTIVERT) 25 MG tablet Take 1 tablet (25 mg total) by mouth 3 (three) times daily as needed for dizziness. 06/05/18   Jacqlyn Larsen, PA-C  mirtazapine (REMERON) 30 MG tablet Take 30 mg by mouth at bedtime.    [provider]  Multiple Vitamin (MULTIVITAMIN WITH MINERALS) TABS tablet Take 1 tablet by mouth daily.    [provider]  mupirocin cream  (BACTROBAN) 2 % Apply topically daily. Apply until resolution of peri-naval wound Patient taking differently: Apply 1 application topically daily. Apply to affected area/perinaval wound 11/06/17   Bonnita Hollow, MD  OLANZapine (ZYPREXA) 5 MG tablet Take 5 mg by mouth daily.  05/31/18   [provider]  polyethylene glycol powder (MIRALAX) powder Take 17 g by mouth daily. Mix 17gm with 8 ounces liquid    [provider]  QUEtiapine (SEROQUEL) 400 MG tablet Take 400 mg by mouth at bedtime.     [provider]  tamsulosin (FLOMAX) 0.4 MG CAPS capsule Take 2 capsules (0.8 mg total) by mouth daily. 11/06/17   Bonnita Hollow, MD  traMADol (ULTRAM) 50 MG tablet Take 1 tablet (50 mg total) by mouth every 6 (six) hours as needed. 10/21/18   Domenic Moras, PA-C    Family History Family History  Problem Relation Age of Onset  . Cancer Other   . Stroke Other     Social History Social History   Tobacco Use  . Smoking status: Former Smoker    Packs/day: 2.00    Types: Cigarettes  . Smokeless tobacco: Never Used  . Tobacco comment: refused  Substance Use Topics  . Alcohol use: No    Frequency: Never    Comment: occasional  . Drug use: No     Allergies   Penicillins   Review of Systems Review of Systems  Unable to perform ROS: Psychiatric disorder  Constitutional: Positive for fever.  Respiratory: Negative for cough and shortness of breath.   Cardiovascular: Negative for chest pain.  Gastrointestinal: Positive for diarrhea. Negative for abdominal pain and vomiting.     Physical Exam Updated Vital Signs BP 121/80   Pulse 68   Temp 98.2 F (36.8 C) (Oral)   Resp 15   SpO2 99%   Physical Exam Vitals signs and nursing note reviewed.  Constitutional:      Appearance: He is well-developed.  HENT:     Head: Normocephalic and atraumatic.  Eyes:     Conjunctiva/sclera: Conjunctivae normal.  Neck:     Musculoskeletal: Neck supple.  Cardiovascular:      Rate and Rhythm: Normal rate and regular rhythm.     Pulses: Normal pulses.     Heart sounds: Normal heart sounds. No murmur.  Pulmonary:     Effort: Pulmonary effort is normal. No respiratory distress.     Breath sounds: Normal breath sounds. No wheezing, rhonchi or rales.  Abdominal:     General: Bowel sounds are normal.     Palpations: Abdomen is soft.     Tenderness: There is no abdominal tenderness.  Skin:    General: Skin is warm and dry.  Neurological:     Mental Status: He is alert.      ED Treatments /  Results  Labs (all labs ordered are listed, but only abnormal results are displayed) Labs Reviewed  CBC WITH DIFFERENTIAL/PLATELET - Abnormal; Notable for the following components:      Result Value   RBC 3.65 (*)    Hemoglobin 11.5 (*)    HCT 35.9 (*)    All other components within normal limits  COMPREHENSIVE METABOLIC PANEL - Abnormal; Notable for the following components:   Creatinine, Ser 0.56 (*)    AST 14 (*)    All other components within normal limits  SARS CORONAVIRUS 2 (HOSPITAL ORDER, [place] [facility])  URINALYSIS, ROUTINE W REFLEX MICROSCOPIC    EKG None  Radiology Dg Chest Portable 1 View  Result Date: 06/15/2019 CLINICAL DATA:  Fever EXAM: PORTABLE CHEST 1 VIEW COMPARISON:  06/05/2018 chest radiograph. FINDINGS: Stable cardiomediastinal silhouette with normal heart size. No pneumothorax. No pleural effusion. Lungs appear clear, with no acute consolidative airspace disease and no pulmonary edema. IMPRESSION: No active disease. Electronically Signed   By: Ilona Sorrel M.D.   On: 06/15/2019 14:39    Procedures Procedures (including critical care time)  Medications Ordered in ED Medications  acetaminophen (TYLENOL) tablet 1,000 mg (1,000 mg Oral Given 06/15/19 1406)     Initial Impression / Assessment and Plan / ED Course  I have reviewed the triage vital signs and the nursing notes.  Pertinent labs & imaging results  that were available during my care of the patient were reviewed by me and considered in my medical decision making (see chart for details).   Final Clinical Impressions(s) / ED Diagnoses   Final diagnoses:  Fever, unspecified fever cause   58 year old male presenting for evaluation of fever at his facility prior to arrival.  They are concerned because they have a COVID outbreak at their facility.  He was tested for COVID yesterday but results were not available.  At this time patient denies any complaints.  He is well-appearing, vital signs are within normal limits.  Will check labs, COVID test, UA, chest x-ray to rule out other source of infection.  CBC without leukocytosis, anemia stable, CMP is nonacute, cover testing negative, chest x-ray negative for pneumonia or other acute abnormality. . At shift change, UA pending.  Care transition to Janeece Fitting, PA-C with plan to follow-up on pending UA.  Patient likely stable for discharge home.  ED Discharge Orders    None       Bishop Dublin 06/15/19 1729    Quintella Reichert, MD 06/16/19 [phone]

## 2019-06-15 NOTE — ED Triage Notes (Signed)
Patient is from [facility] and presents with complaint of a fever of 101 per the facility. The facility has had clients with Covid 19 recently. The patient is reportedly asymptomatic. He was tested 2 days ago for Covid 19 but, the test has yet to result. Per facility, the patient normally has SOB and a cough. The patient currently has no complaints.    EMS vitals: 100.6Temp 136/70 BP 90 HR  98% O2 sat on room air

## 2019-06-15 NOTE — ED Notes (Signed)
Aware we need urine, urinal at bedside

## 2019-06-17 LAB — URINE CULTURE: Culture: <10000 — AB

## 2019-12-23 ENCOUNTER — Encounter (HOSPITAL_COMMUNITY): Payer: Self-pay | Admitting: Emergency Medicine

## 2019-12-23 ENCOUNTER — Emergency Department (HOSPITAL_COMMUNITY)
Admission: EM | Admit: 2019-12-23 | Discharge: 2019-12-23 | Disposition: A | Discharge status: Home / Self Care | Payer: Medicare Other | Attending: Emergency Medicine | Admitting: Emergency Medicine

## 2019-12-23 DIAGNOSIS — B379 Candidiasis, unspecified: Secondary | ICD-10-CM | POA: Diagnosis not present

## 2019-12-23 DIAGNOSIS — R300 Dysuria: Secondary | ICD-10-CM | POA: Diagnosis present

## 2019-12-23 DIAGNOSIS — Z87891 Personal history of nicotine dependence: Secondary | ICD-10-CM | POA: Diagnosis not present

## 2019-12-23 DIAGNOSIS — R350 Frequency of micturition: Secondary | ICD-10-CM | POA: Insufficient documentation

## 2019-12-23 DIAGNOSIS — Z79899 Other long term (current) drug therapy: Secondary | ICD-10-CM | POA: Diagnosis not present

## 2019-12-23 MED ORDER — DOXYCYCLINE HYCLATE 100 MG PO CAPS: 100.0000 mg | ORAL_CAPSULE | Freq: Two times a day (BID) | ORAL | 0 refills | Status: AC

## 2019-12-23 MED ORDER — NYSTATIN 100000 UNIT/GM EX CREA: 1.0000 "application " | TOPICAL_CREAM | Freq: Two times a day (BID) | CUTANEOUS | 0 refills | Status: DC

## 2019-12-23 NOTE — ED Notes (Signed)
Per doctor patient given water

## 2019-12-23 NOTE — ED Provider Notes (Signed)
[facility] DEPT Provider Note   CSN: [id number] Arrival date & time: 12/23/19  [id number]     History Chief Complaint  Patient presents with  . Dysuria    Franklin Ayers is a 59 y.o. male.  Patient has noticed increased urinary frequency, redness to the scrotum over the last several days.  The history is provided by the patient.  Male GU Problem Presenting symptoms: dysuria   Presenting symptoms: no penile discharge and no penile pain   Presenting symptoms comment:  Redness to scrotum/penis Context: spontaneously   Relieved by:  Nothing Worsened by:  Nothing Ineffective treatments:  None tried Associated symptoms: genital itching and urinary frequency   Associated symptoms: no abdominal pain, no diarrhea, no fever, no flank pain, no hematuria, no nausea, no penile redness, no penile swelling, no scrotal swelling, no urinary hesitation, no urinary incontinence, no urinary retention and no vomiting   Risk factors: no urinary catheter        Past Medical History:  Diagnosis Date  . Anemia   . BPH (benign prostatic hyperplasia)   . 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 Cystitis   . Dysphagia   . Hypokalemia   . Manic depression ([place])   . Mental disorder   . Schizoaffective disorder [facility])     Patient Active Problem List   Diagnosis Date Noted  . 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 Fever of unknown origin 11/16/2017  . Physical deconditioning 11/08/2017  . Benign prostatic hyperplasia   . 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 Hematuria   . Soft tissue infection   . Malnutrition of moderate degree 10/18/2017  . Acute urinary retention   . Anemia   . Hypokalemia   . Bladder distension   . Urinary tract infection with hematuria   . Altered mental status 10/14/2017  . Schizoaffective disorder ([place]) 12/23/2016    Past Surgical History:  Procedure Laterality Date  . TONSILLECTOMY         Family History  Problem  Relation Age of Onset  . Cancer Other   . Stroke Other     Social History   Tobacco Use  . Smoking status: Former Smoker    Packs/day: 2.00    Types: Cigarettes  . Smokeless tobacco: Never Used  . Tobacco comment: refused  Substance Use Topics  . Alcohol use: No    Comment: occasional  . Drug use: No    Home Medications Prior to Admission medications   Medication Sig Start Date End Date Taking? Authorizing Provider  acetaminophen (TYLENOL) 325 MG tablet Take 650 mg by mouth See admin instructions. 650mg  three times daily and Can take an additional two tablets every 4 hours as needed for pain  - CANNOT EXCEED 3,000 MG IN A 24-HR PERIOD   Yes [provider]  alum & mag hydroxide-simeth (MI-ACID) 200-200-20 MG/5ML suspension Take 30 mLs by mouth as needed for indigestion or heartburn. Not to exceed 4 doses in 24 hours.   Yes [provider]  antiseptic oral rinse (BIOTENE) LIQD 15 mLs by Mouth Rinse route daily.   Yes [provider]  cholecalciferol (VITAMIN D) 25 MCG (1000 UNIT) tablet Take 1,000 Units by mouth daily.   Yes [provider]  divalproex (DEPAKOTE ER) 500 MG 24 hr tablet Take 1 tablet (500 mg total) by mouth daily with breakfast. Patient taking differently: Take 500-1,000 mg by mouth See admin instructions. Take 500 mg by mouth in the morning and  1,000 mg at bedtime 11/06/17  Yes Bonnita Hollow, MD  lactulose [facility]) 10 GM/15ML solution Take 20 g by mouth daily.   Yes [provider]  loperamide (IMODIUM) 2 MG capsule Take 2 mg by mouth as needed for diarrhea or loose stools (No more than 8 doses in 24 hours.).   Yes [provider]  LORazepam (ATIVAN) 0.5 MG tablet Take 1 tablet (0.5 mg total) by mouth every 8 (eight) hours. Patient taking differently: Take 0.5 mg by mouth 3 (three) times daily.  12/06/17  Yes Gerlene Fee, NP  magnesium hydroxide (MILK OF MAGNESIA) 400 MG/5ML suspension Take 30 mLs by mouth  at bedtime as needed (For constipation.).   Yes [provider]  meclizine (ANTIVERT) 25 MG tablet Take 1 tablet (25 mg total) by mouth 3 (three) times daily as needed for dizziness. 06/05/18  Yes Jacqlyn Larsen, PA-C  mirtazapine (REMERON) 30 MG tablet Take 30 mg by mouth at bedtime.   Yes [provider]  Multiple Vitamin (MULTIVITAMIN WITH MINERALS) TABS tablet Take 1 tablet by mouth daily.   Yes [provider]  OLANZapine (ZYPREXA) 10 MG tablet Take 10 mg by mouth at bedtime. 11/11/19  Yes [provider]  polyvinyl alcohol (LIQUIFILM TEARS) 1.4 % ophthalmic solution Place 2 drops into both eyes 2 (two) times daily.   Yes [provider]  PRESCRIPTION MEDICATION Take 1 Bottle by mouth 2 (two) times daily as needed (weight loss). Mighty Shakes   Yes [provider]  QUEtiapine (SEROQUEL) 400 MG tablet Take 400 mg by mouth at bedtime.    Yes [provider]  sennosides-docusate sodium (SENOKOT-S) 8.6-50 MG tablet Take 1 tablet by mouth in the morning and at bedtime.   Yes [provider]  tamsulosin (FLOMAX) 0.4 MG CAPS capsule Take 2 capsules (0.8 mg total) by mouth daily. Patient taking differently: Take 0.4 mg by mouth daily.  11/06/17  Yes Bonnita Hollow, MD  doxycycline (VIBRAMYCIN) 100 MG capsule Take 1 capsule (100 mg total) by mouth 2 (two) times daily for 7 days. 12/23/19 12/30/19  Deion Forgue, DO  mupirocin cream (BACTROBAN) 2 % Apply topically daily. Apply until resolution of peri-naval wound Patient not taking: Reported on 12/23/2019 11/06/17   Bonnita Hollow, MD  nystatin cream (MYCOSTATIN) Apply 1 application topically 2 (two) times daily. Apply to scrotum and penis until cleared 12/23/19   Bronc Brosseau, DO  traMADol (ULTRAM) 50 MG tablet Take 1 tablet (50 mg total) by mouth every 6 (six) hours as needed. Patient not taking: Reported on 12/23/2019 10/21/18   Domenic Moras, PA-C    Allergies     Penicillins  Review of Systems   Review of Systems  Constitutional: Negative for chills and fever.  HENT: Negative for ear pain and sore throat.   Eyes: Negative for pain and visual disturbance.  Respiratory: Negative for cough and shortness of breath.   Cardiovascular: Negative for chest pain and palpitations.  Gastrointestinal: Negative for abdominal pain, diarrhea, nausea and vomiting.  Genitourinary: Positive for dysuria and frequency. Negative for bladder incontinence, decreased urine volume, difficulty urinating, discharge, enuresis, flank pain, genital sores, hematuria, hesitancy, penile pain, penile swelling, scrotal swelling, testicular pain and urgency.  Musculoskeletal: Negative for arthralgias and back pain.  Skin: Negative for color change and rash.  Neurological: Negative for seizures and syncope.  All other systems reviewed and are negative.   Physical Exam Updated Vital Signs  ED Triage Vitals  Enc  Vitals Group     BP 12/23/19 0837 115/81     Pulse Rate 12/23/19 0837 78     Resp 12/23/19 0837 16     Temp 12/23/19 0837 97.9 F (36.6 C)     Temp Source 12/23/19 0837 Oral     SpO2 12/23/19 0837 99 %     Weight --      Height --      Head Circumference --      Peak Flow --      Pain Score 12/23/19 0839 5     Pain Loc --      Pain Edu? --      Excl. in [place]? --      Physical Exam Vitals and nursing note reviewed.  Constitutional:      General: He is not in acute distress.    Appearance: He is well-developed. He is not ill-appearing.  HENT:     Mouth/Throat:     Mouth: Mucous membranes are moist.  Cardiovascular:     Rate and Rhythm: Normal rate and regular rhythm.     Pulses: Normal pulses.     Heart sounds: Normal heart sounds. No murmur.  Pulmonary:     Effort: Pulmonary effort is normal. No respiratory distress.     Breath sounds: Normal breath sounds.  Abdominal:     Palpations: Abdomen is soft.     Tenderness: There is no abdominal tenderness.   Genitourinary:    Testes: Normal.     Comments: Beefy red rash to scrotum and shaft of penis, no drainage, no paraphimosis/no abscess, no crepitus in the GU area Musculoskeletal:        General: Normal range of motion.     Cervical back: Neck supple.  Skin:    General: Skin is warm and dry.     Capillary Refill: Capillary refill takes less than 2 seconds.     Findings: Rash (scrotum) present.  Neurological:     Mental Status: He is alert.     ED Results / Procedures / Treatments   Labs (all labs ordered are listed, but only abnormal results are displayed) Labs Reviewed - No data to display  EKG None  Radiology No results found.  Procedures Procedures (including critical care time)  Medications Ordered in ED Medications - No data to display  ED Course  I have reviewed the triage vital signs and the nursing notes.  Pertinent labs & imaging results that were available during my care of the patient were reviewed by me and considered in my medical decision making (see chart for details).    MDM Rules/Calculators/A&P                      Franklin Ayers is a 59 year old male with history of schizophrenia who presents to the ED with pain with urination, scrotal rash.  Patient with unremarkable vitals.  No fever.  Patient appears to have fungal type rash of scrotum and shaft of his penis.  Likely in the setting of not keeping good hygiene.  Patient has issues going to the bathroom by himself and therefore sits in what depends it seems like.  Will give nystatin cream for this.  Needs to keep better hygiene.   Will treat with nystatin cream.  Will give prescription for doxycycline in case possible cellulitic nature/UTI.  Recommend better hygiene and discharged in ED in good condition.   This chart was dictated using voice recognition software.  Despite best  efforts to proofread,  errors can occur which can change the documentation meaning.    Final Clinical Impression(s) /  ED Diagnoses Final diagnoses:  Yeast infection    Rx / DC Orders ED Discharge Orders         Ordered    nystatin cream (MYCOSTATIN)  2 times daily     12/23/19 0846    doxycycline (VIBRAMYCIN) 100 MG capsule  2 times daily     12/23/19 1255           Keymora Grillot, [place], DO 12/23/19 1304

## 2019-12-23 NOTE — ED Notes (Signed)
[facility] here for transport back to [facility].

## 2019-12-23 NOTE — ED Notes (Signed)
Patient given peanut butter and crackers

## 2019-12-23 NOTE — Discharge Instructions (Addendum)
Please keep patient's genital area clean and dry as much as possible.

## 2019-12-23 NOTE — ED Triage Notes (Signed)
Patient here from home with complaints of difficulty urinating, from [facility]. Hx of same.

## 2019-12-23 NOTE — ED Notes (Signed)
[facility] called. RN attempted to call the facility but there was no answer.

## 2020-01-21 IMAGING — CT CT ABD-PELV W/ CM
2 of 5 series · 16 of 46 positions shown, 18 images · IV contrast (iopamidol)
Comparison: None

CLINICAL DATA: Soft tissue infection of abdomen, initial encounter

EXAM:
CT ABDOMEN AND PELVIS WITH CONTRAST
TECHNIQUE: Multidetector CT imaging of the abdomen and pelvis was performed
using the standard protocol following bolus administration of
intravenous contrast. Sagittal and coronal MPR images reconstructed
from axial data set.
CONTRAST:  100mL 59TXED-033 IOPAMIDOL (59TXED-033) INJECTION 61% IV.
Dilute oral contrast.

[Series 3: abdomen 5.0 · axial · 0.67mm/px · z∈[-848,-428]mm · 13 of 96 slices shown, 15 images]
[im 6/96  soft-tissue]
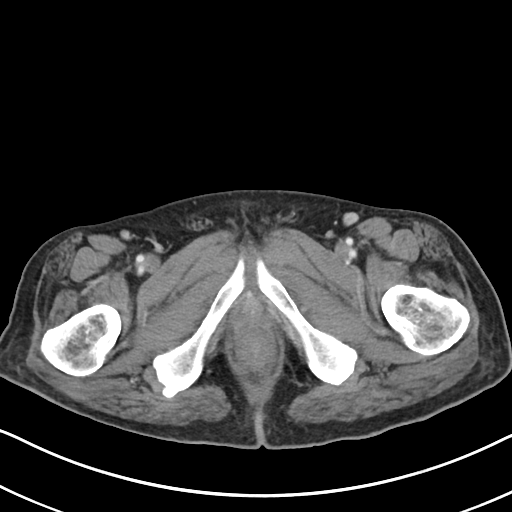
[im 6/96  bone]
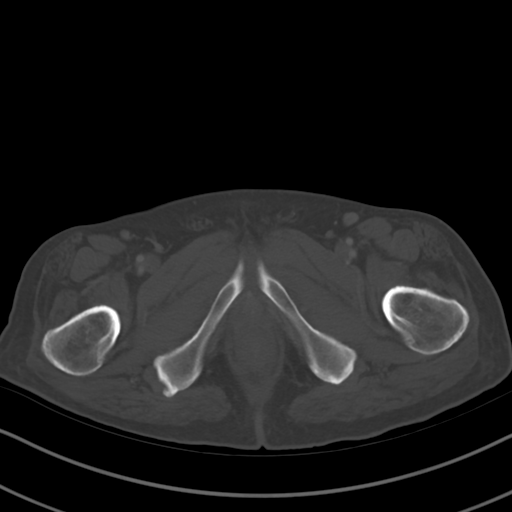
[im 11/96  soft-tissue]
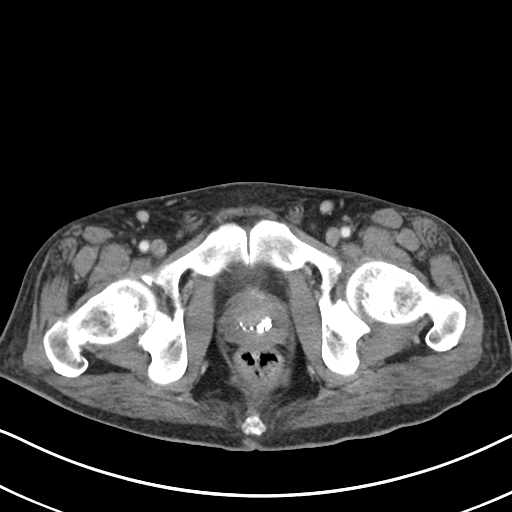
[im 22/96  soft-tissue]
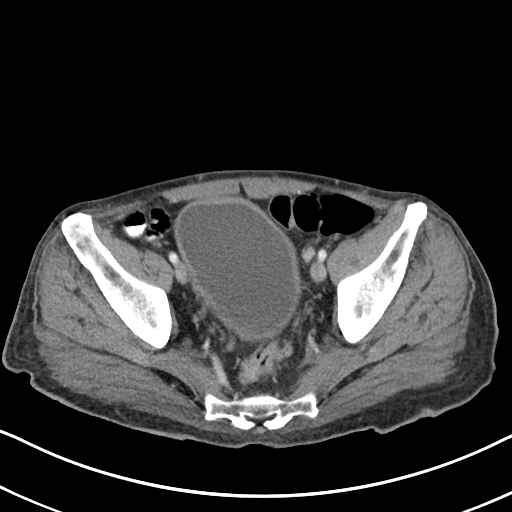
[im 27/96  soft-tissue]
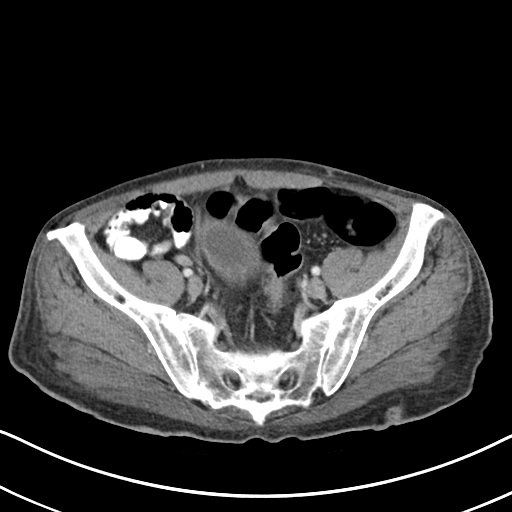
[im 32/96  soft-tissue]
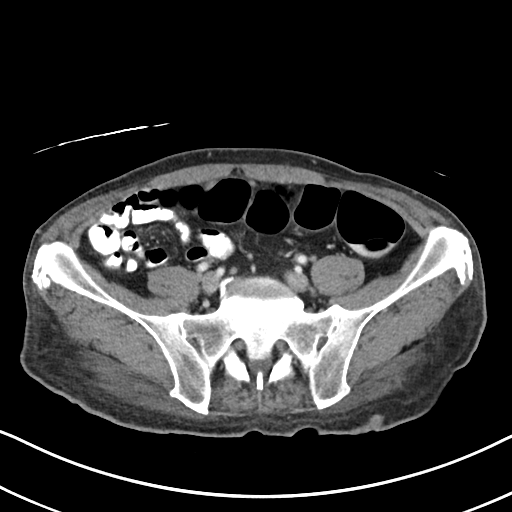
[im 43/96  soft-tissue]
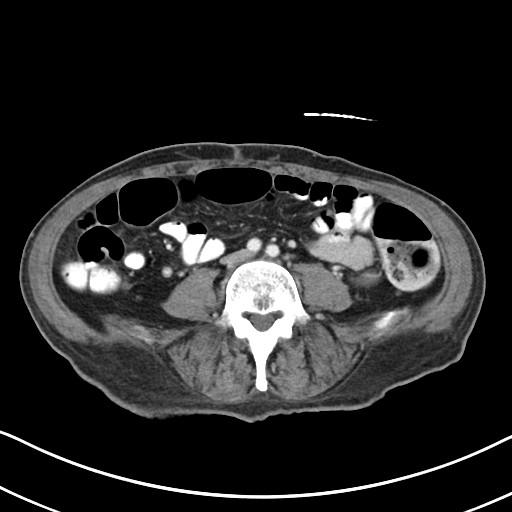
[im 48/96  soft-tissue]
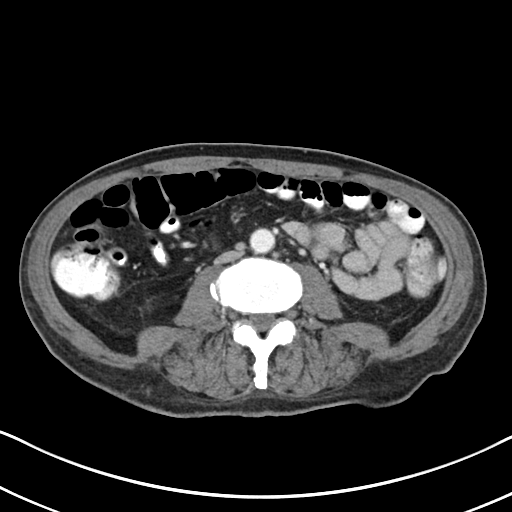
[im 53/96  soft-tissue]
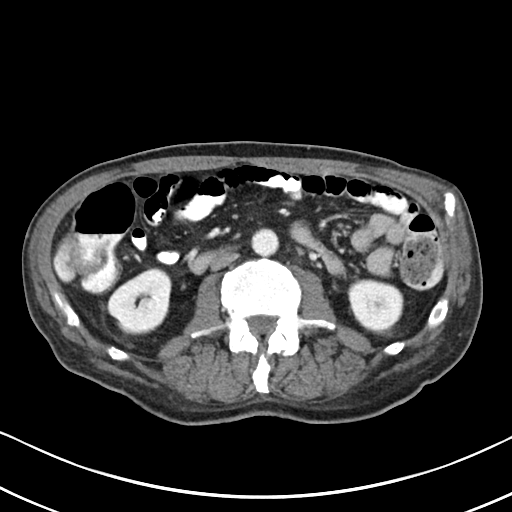
[im 64/96  soft-tissue]
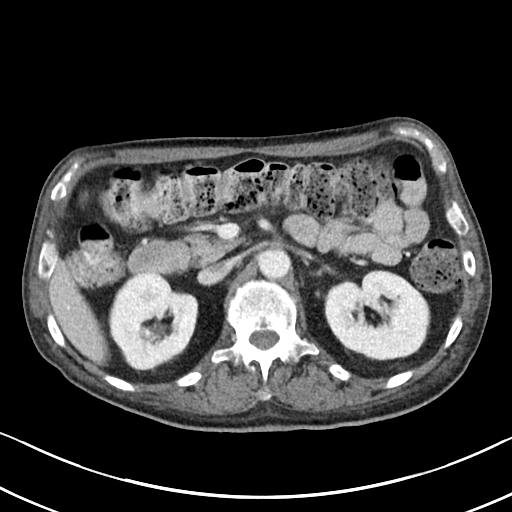
[im 64/96  bone]
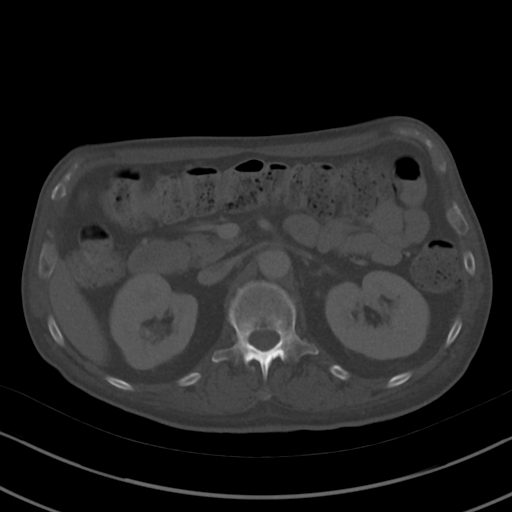
[im 69/96  soft-tissue]
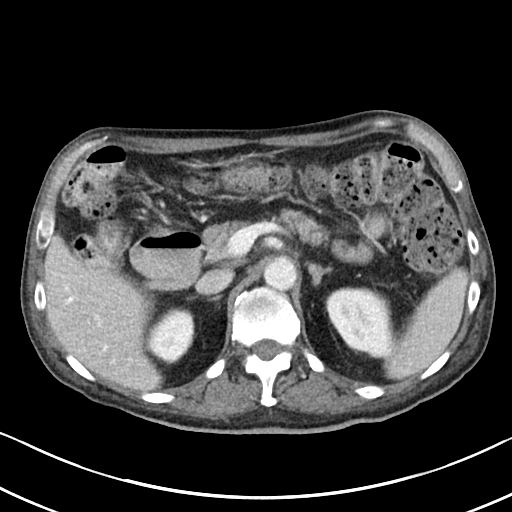
[im 74/96  soft-tissue]
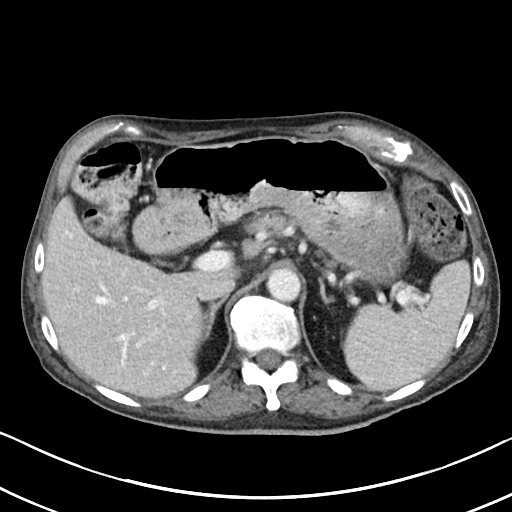
[im 85/96  soft-tissue]
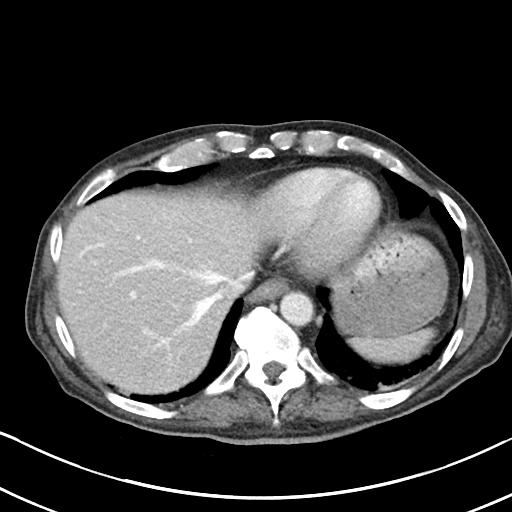
[im 90/96  soft-tissue]
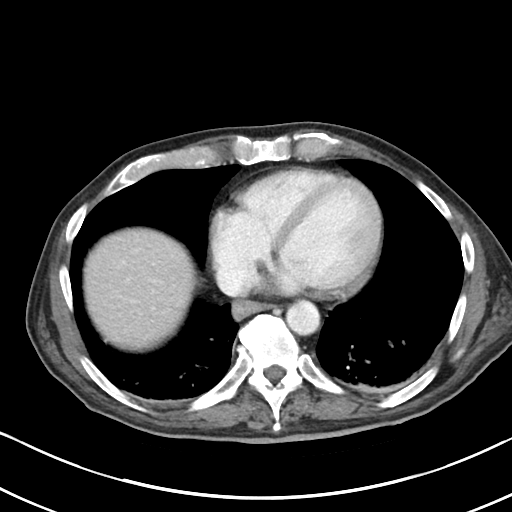

[Series 6: abdomen 3.0 mpr cor · coronal · 0.68mm/px · 3 of 95 slices shown]
[im 32/95  soft-tissue]
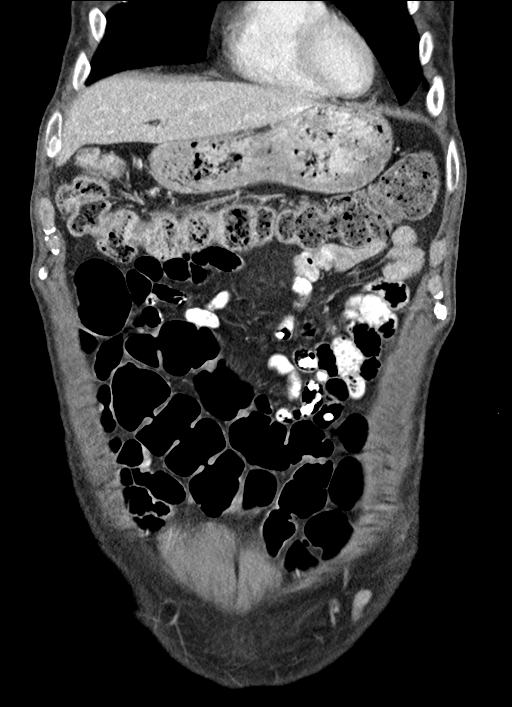
[im 42/95  soft-tissue]
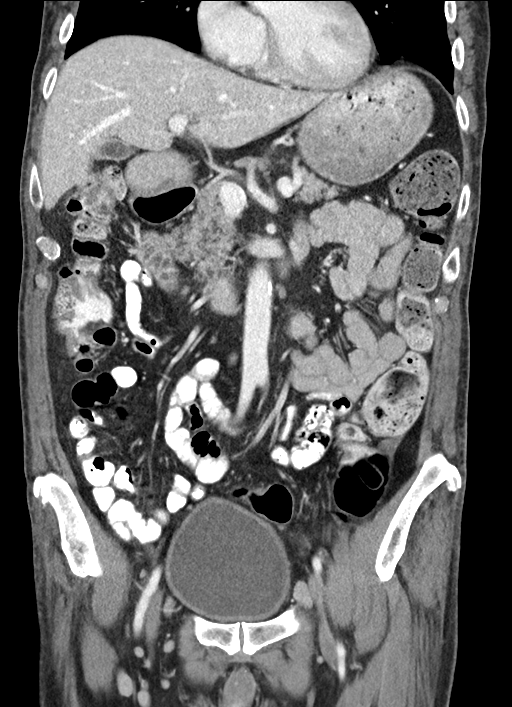
[im 53/95  soft-tissue]
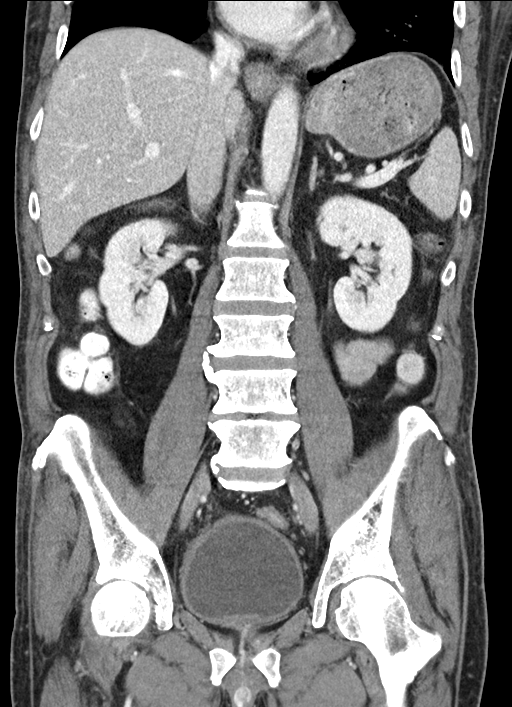

[16 of 46 positions shown; findings below may reference images not displayed]

FINDINGS: Lower chest: Mild dependent bibasilar atelectasis

Hepatobiliary: Normal appearance

Pancreas: Normal appearance

Spleen: Normal appearance

Adrenals/Urinary Tract: Adrenal glands, kidneys, ureters, and
bladder normal appearance. Mild prostatic enlargement with central
calcifications.

Stomach/Bowel: Normal appendix. Stomach and bowel loops normal
appearance.

Vascular/Lymphatic: Mild atherosclerotic calcifications aorta. Aorta
normal caliber. Multiple normal to upper normal sized inguinal and
pelvic lymph nodes. No adenopathy.

Reproductive: Seminal vesicles unremarkable

Other: No free intraperitoneal air or fluid.  No hernia.

Musculoskeletal: Normal appearance
IMPRESSION: Mild prostatic enlargement.

No acute intra-abdominal or intrapelvic abnormalities.

Aortic Atherosclerosis ([id number].O).

## 2022-06-18 ENCOUNTER — Emergency Department (HOSPITAL_COMMUNITY)
Admission: EM | Admit: 2022-06-18 | Discharge: 2022-06-19 | Discharge status: Against Medical Advice | Payer: Medicare Other | Attending: Emergency Medicine | Admitting: Emergency Medicine

## 2022-06-18 ENCOUNTER — Other Ambulatory Visit: Payer: Self-pay

## 2022-06-18 ENCOUNTER — Encounter (HOSPITAL_COMMUNITY): Payer: Self-pay | Admitting: Emergency Medicine

## 2022-06-18 DIAGNOSIS — R109 Unspecified abdominal pain: Secondary | ICD-10-CM | POA: Insufficient documentation

## 2022-06-18 DIAGNOSIS — Z5321 Procedure and treatment not carried out due to patient leaving prior to being seen by health care provider: Secondary | ICD-10-CM | POA: Diagnosis not present
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
## 2022-06-19 NOTE — ED Notes (Signed)
Pt left due to not being seen quick enough 

## 2022-07-14 ENCOUNTER — Ambulatory Visit (HOSPITAL_COMMUNITY)
Admission: EM | Admit: 2022-07-14 | Discharge: 2022-07-14 | Disposition: A | Discharge status: Home / Self Care | Payer: Medicare Other | Attending: Behavioral Health | Admitting: Behavioral Health

## 2022-07-14 DIAGNOSIS — F209 Schizophrenia, unspecified: Secondary | ICD-10-CM | POA: Insufficient documentation

## 2022-07-14 DIAGNOSIS — Z046 Encounter for general psychiatric examination, requested by authority: Secondary | ICD-10-CM | POA: Diagnosis not present

## 2022-07-14 NOTE — Progress Notes (Signed)
Writer entered resources prior to discharge.  Drema Balzarine  [facility]

## 2022-07-14 NOTE — BH Assessment (Deleted)
Message from [place] in day shift chat:   Franklin Ayers is ready for discharge a staff member from [facility] will pick him up around 7:30 pm. [name] (nursing administrator )  number is [phone] and the Facility number is [phone]. I will rescind the IVC
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
## 2022-07-14 NOTE — Discharge Instructions (Addendum)
Discharge recommendations:  Patient is to take medications as prescribed. No medication changes were made during your visit.  Please follow up with your outpatient psychiatrist Dr. Michele Rockers at [facility] for medication management.   Please follow up with your primary care provider for all medical related needs.   Therapy: We recommend that patient participate in individual therapy to address mental health concerns.  Medications: The parent/guardian is to contact a medical professional and/or outpatient provider to address any new side effects that develop. Parent/guardian should update outpatient providers of any new medications and/or medication changes.   Atypical antipsychotics: If you are prescribed an atypical antipsychotic, it is recommended that your height, weight, BMI, blood pressure, fasting lipid panel, and fasting blood sugar be monitored by your outpatient providers.  Safety:  The patient should abstain from use of illicit substances/drugs and abuse of any medications. If symptoms worsen or do not continue to improve or if the patient becomes actively suicidal or homicidal then it is recommended that the patient return to the closest hospital emergency department, the [facility], or call 911 for further evaluation and treatment. National Suicide Prevention Lifeline 1-800-SUICIDE or (450)502-2346.  About 988 988 offers 24/7 access to trained crisis counselors who can help people experiencing mental health-related distress. People can call or text 988 or chat 988lifeline.org for themselves or if they are worried about a loved one who may need crisis support.   Guardianship and Alternatives to Guardianship   Determining Incompetence Anyone may file a written request (a petition) with the clerk of superior court alleging that an adult (the respondent) should be declared incompetent. Every clerk's office has forms that may be completed and  filed for the petition. The petition must include a sworn statement that the information in the petition is true. A fee for filing the petition may be required and may be reimbursed later by the court unless the court determines the petitioner did not have good reason to start the guardianship proceeding.  When a petition is filed, the clerk of court sets a date and time for the guardianship hearing. The sheriff serves copies of the petition and notice of the hearing on the respondent and on his attorney or other representative. The petitioner must mail copies of the petition and notice of the hearing to the respondent's spouse and relatives.  The petitioner may not need to be represented by an attorney at the hearing. However, it is advisable to at least talk with an attorney before starting a proceeding to have the court declare someone incompetent. If the petitioner is represented by an attorney at the hearing, the petitioner is responsible for paying the attorney's fees.  Guardianship Proceedings Before the hearing, the clerk may order medical, psychological, social work and other evaluations of the respondent. The petitioner or respondent may request such evaluations at the time the petition is filed, and both may receive a written report of the results.  The respondent may arrange for an attorney of his choice to represent him in the proceeding. If he doesn't have an attorney, the clerk of superior court appoints one, called a guardian ad litem, to represent him. The respondent is responsible for his attorney's fee. If the respondent is not financially able to pay the fee, the court will pay it.  At the hearing, the clerk serves as judge. The clerk or a jury will consider the results of the requested evaluations and other evidence that relates to whether the  respondent is incompetent. The petitioner is responsible for presenting sufficient evidence to convince the clerk or jury that the respondent  is incompetent. If the evidence does not convince the clerk or jury that the respondent is incompetent, the clerk dismisses the petition. If the clerk or jury decides that the respondent is incompetent, the clerk hears additional evidence about who should be appointed the guardian or guardians for the adult.  Once the adult has been determined to be incompetent and a guardian(s) is appointed, the guardian will receive a written order of appointment from the clerk. This order explains the guardian's powers and duties. If the guardian has questions about his powers and duties, he may direct them to the clerk or to an attorney.  Types of Guardians The clerk may appoint a guardian of the person a guardian of the estate or a general guardian. The specific powers and duties a guardian may be given are found in [facility], [place].  Guardian's Financial Obligations A guardian is not required to support the ward financially or to contribute the guardian's own resources to the ward.  The guardian is not liable for the ward's debts. A guardian may be reimbursed from the ward's estate for reasonable expenses incurred in carrying out his duties as guardian. A guardian of the estate or general guardian may also receive a commission set by the clerk from the ward's estate for serving as guardian. A guardianship, and the guardian's powers and duties end when any of the following occurs:  The guardian Franklin Ayers The ward's competence is restored The clerk of superior court removes the guardian from his position The ward dies Side Nav Aging and Adult Services Adult Care Homes Adult Day Services Adult Placement Services Adult Scientist, forensic Aging Resources Management System (ARMS) Aging and Adult [place] and Announcements Alzheimer's Disease and Related Dementia Information and Resources Care Management of [place] Older Adults Surveyor, mining  for Disabled Adults Counseling Services at Departments of [place] and Committees Facts, Copywriter, advertising, and [place] Partner Resources Guardianship and Alternatives to [place] and Home Improvement Assistance In-Home Aides Information and Options Counseling [place] for [place] (Advocacy for residents in long term care facilities) MPA: All Ages, All Stages [place] Monitoring [place] [facility] [place] [employer] Nutrition Program [facility]) Nutrition-Congregate and [place] and Procedure [place] (Multimedia programmer) [place] for [place] for In Home Residents Transportation Services for Older Adults

## 2022-07-14 NOTE — Progress Notes (Signed)
Facility member reports they are on the way now to get Franklin Ayers.   Drema Balzarine [facility]
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
## 2022-07-14 NOTE — Progress Notes (Signed)
Message from [place] white during day shift chat:    Franklin Ayers is ready for discharge a staff member from [facility] will pick him up around 7:30 pm. [name] (nursing administrator )  number is [phone] and the Facility number is [phone]. I will rescind the IVC

## 2022-09-26 ENCOUNTER — Encounter (HOSPITAL_COMMUNITY): Payer: Self-pay

## 2022-09-26 ENCOUNTER — Emergency Department (HOSPITAL_COMMUNITY): Payer: Medicare Other

## 2022-09-26 ENCOUNTER — Emergency Department (HOSPITAL_COMMUNITY)
Admission: EM | Admit: 2022-09-26 | Discharge: 2022-09-26 | Discharge status: Against Medical Advice | Payer: Medicare Other | Attending: Medical | Admitting: Medical

## 2022-09-26 ENCOUNTER — Other Ambulatory Visit: Payer: Self-pay

## 2022-09-26 DIAGNOSIS — R220 Localized swelling, mass and lump, head: Secondary | ICD-10-CM | POA: Insufficient documentation

## 2022-09-26 DIAGNOSIS — M272 Inflammatory conditions of jaws: Secondary | ICD-10-CM | POA: Insufficient documentation

## 2022-09-26 DIAGNOSIS — Z5321 Procedure and treatment not carried out due to patient leaving prior to being seen by health care provider: Secondary | ICD-10-CM | POA: Insufficient documentation

## 2022-09-26 LAB — CBC WITH DIFFERENTIAL/PLATELET
Abs Immature Granulocytes: 0.03 10*3/uL (ref 0.00–0.07)
Basophils Absolute: 0 10*3/uL (ref 0.0–0.1)
Basophils Relative: 1 %
Eosinophils Absolute: 0 10*3/uL (ref 0.0–0.5)
Eosinophils Relative: 2 %
HCT: 37.3 % — ABNORMAL LOW (ref 39.0–52.0)
Hemoglobin: 11.7 g/dL — ABNORMAL LOW (ref 13.0–17.0)
Immature Granulocytes: 1 %
Lymphocytes Relative: 55 %
Lymphs Abs: 1.2 10*3/uL (ref 0.7–4.0)
MCH: 27.7 pg (ref 26.0–34.0)
MCHC: 31.4 g/dL (ref 30.0–36.0)
MCV: 88.2 fL (ref 80.0–100.0)
Monocytes Absolute: 0.2 10*3/uL (ref 0.1–1.0)
Monocytes Relative: 11 %
Neutro Abs: 0.7 10*3/uL — ABNORMAL LOW (ref 1.7–7.7)
Neutrophils Relative %: 30 %
Platelets: 42 10*3/uL — ABNORMAL LOW (ref 150–400)
RBC: 4.23 MIL/uL (ref 4.22–5.81)
RDW: 18.6 % — ABNORMAL HIGH (ref 11.5–15.5)
WBC: 2.2 10*3/uL — ABNORMAL LOW (ref 4.0–10.5)
nRBC: 1.4 % — ABNORMAL HIGH (ref 0.0–0.2)

## 2022-09-26 LAB — BASIC METABOLIC PANEL
Anion gap: 9 (ref 5–15)
BUN: 14 mg/dL (ref 6–20)
CO2: 25 mmol/L (ref 22–32)
Calcium: 9.3 mg/dL (ref 8.9–10.3)
Chloride: 100 mmol/L (ref 98–111)
Creatinine, Ser: 0.54 mg/dL — ABNORMAL LOW (ref 0.61–1.24)
GFR, Estimated: >60 mL/min (ref >60)
Glucose, Bld: 91 mg/dL (ref 70–99)
Potassium: 3.9 mmol/L (ref 3.5–5.1)
Sodium: 134 mmol/L — ABNORMAL LOW (ref 135–145)

## 2022-09-26 NOTE — ED Triage Notes (Addendum)
Patient BIB [facility] from [facility]. Has a left sided mass on his face that has been there for several weeks. His doctor sent him in for further workup.

## 2022-09-26 NOTE — ED Notes (Signed)
CT has called for pt as well as CT has called. Will remove from system

## 2022-09-26 NOTE — ED Provider Triage Note (Cosign Needed)
Emergency Medicine Provider Triage Evaluation Note  Franklin Ayers , a 62 y.o. male  was evaluated in triage.  Pt complains of swelling along jaw that has progressively gotten bigger that last few weeks. States it's uncomfortable. No fever, ear pain, or difficulty with facial movements.  Review of Systems  Positive: +mass on jaw Negative: Fever  Physical Exam  BP 118/70 (BP Location: Left Arm)   Pulse 86   Temp 98 F (36.7 C) (Oral)   Resp 17   SpO2 100%  Gen:   Awake, no distress   Resp:  Normal effort  MSK:   Moves extremities without difficulty Other:  +4x5cm mass along posterior aspect of L jaw, inferior to L ear  Medical Decision Making  Medically screening exam initiated at 10:35 AM.  Appropriate orders placed.  Franklin Ayers was informed that the remainder of the evaluation will be completed by another provider, this initial triage assessment does not replace that evaluation, and the importance of remaining in the ED until their evaluation is complete.     Franklin Ayers, [place] 09/26/22 1037

## 2022-11-15 ENCOUNTER — Inpatient Hospital Stay (HOSPITAL_COMMUNITY)
Admission: EM | Admit: 2022-11-15 | Discharge: 2022-11-18 | DRG: 146 | Disposition: A | Discharge status: Home / Self Care | Payer: Medicare Other | Source: Skilled Nursing Facility | Attending: Internal Medicine | Admitting: Internal Medicine

## 2022-11-15 ENCOUNTER — Other Ambulatory Visit: Payer: Self-pay

## 2022-11-15 ENCOUNTER — Encounter (HOSPITAL_COMMUNITY): Payer: Self-pay

## 2022-11-15 ENCOUNTER — Emergency Department (HOSPITAL_COMMUNITY): Payer: Medicare Other

## 2022-11-15 DIAGNOSIS — R221 Localized swelling, mass and lump, neck: Secondary | ICD-10-CM | POA: Diagnosis not present

## 2022-11-15 DIAGNOSIS — R040 Epistaxis: Secondary | ICD-10-CM | POA: Diagnosis not present

## 2022-11-15 DIAGNOSIS — T426X5A Adverse effect of other antiepileptic and sedative-hypnotic drugs, initial encounter: Secondary | ICD-10-CM | POA: Diagnosis present

## 2022-11-15 DIAGNOSIS — Z79899 Other long term (current) drug therapy: Secondary | ICD-10-CM

## 2022-11-15 DIAGNOSIS — N400 Enlarged prostate without lower urinary tract symptoms: Secondary | ICD-10-CM | POA: Diagnosis present

## 2022-11-15 DIAGNOSIS — D490 Neoplasm of unspecified behavior of digestive system: Secondary | ICD-10-CM | POA: Diagnosis present

## 2022-11-15 DIAGNOSIS — Z880 Allergy status to penicillin: Secondary | ICD-10-CM

## 2022-11-15 DIAGNOSIS — C9200 Acute myeloblastic leukemia, not having achieved remission: Secondary | ICD-10-CM | POA: Diagnosis present

## 2022-11-15 DIAGNOSIS — C07 Malignant neoplasm of parotid gland: Principal | ICD-10-CM | POA: Diagnosis present

## 2022-11-15 DIAGNOSIS — F319 Bipolar disorder, unspecified: Secondary | ICD-10-CM | POA: Diagnosis present

## 2022-11-15 DIAGNOSIS — D119 Benign neoplasm of major salivary gland, unspecified: Secondary | ICD-10-CM | POA: Diagnosis present

## 2022-11-15 DIAGNOSIS — D61811 Other drug-induced pancytopenia: Secondary | ICD-10-CM | POA: Diagnosis present

## 2022-11-15 DIAGNOSIS — F259 Schizoaffective disorder, unspecified: Secondary | ICD-10-CM | POA: Diagnosis present

## 2022-11-15 DIAGNOSIS — Z823 Family history of stroke: Secondary | ICD-10-CM

## 2022-11-15 DIAGNOSIS — E538 Deficiency of other specified B group vitamins: Secondary | ICD-10-CM | POA: Diagnosis present

## 2022-11-15 DIAGNOSIS — F17210 Nicotine dependence, cigarettes, uncomplicated: Secondary | ICD-10-CM | POA: Diagnosis present

## 2022-11-15 DIAGNOSIS — D61818 Other pancytopenia: Secondary | ICD-10-CM | POA: Diagnosis present

## 2022-11-15 LAB — I-STAT CHEM 8, ED
BUN: 14 mg/dL (ref 6–20)
Calcium, Ion: 1.21 mmol/L (ref 1.15–1.40)
Chloride: 99 mmol/L (ref 98–111)
Creatinine, Ser: 0.5 mg/dL — ABNORMAL LOW (ref 0.61–1.24)
Glucose, Bld: 79 mg/dL (ref 70–99)
HCT: 29 % — ABNORMAL LOW (ref 39.0–52.0)
Hemoglobin: 9.9 g/dL — ABNORMAL LOW (ref 13.0–17.0)
Potassium: 3.8 mmol/L (ref 3.5–5.1)
Sodium: 138 mmol/L (ref 135–145)
TCO2: 30 mmol/L (ref 22–32)

## 2022-11-15 LAB — TECHNOLOGIST SMEAR REVIEW: Plt Morphology: DECREASED

## 2022-11-15 LAB — CBC WITH DIFFERENTIAL/PLATELET
Abs Immature Granulocytes: 0.03 10*3/uL (ref 0.00–0.07)
Basophils Absolute: 0 10*3/uL (ref 0.0–0.1)
Basophils Relative: 1 %
Eosinophils Absolute: 0 10*3/uL (ref 0.0–0.5)
Eosinophils Relative: 2 %
HCT: 29.4 % — ABNORMAL LOW (ref 39.0–52.0)
Hemoglobin: 9.1 g/dL — ABNORMAL LOW (ref 13.0–17.0)
Immature Granulocytes: 2 %
Lymphocytes Relative: 80 %
Lymphs Abs: 1.5 10*3/uL (ref 0.7–4.0)
MCH: 27.4 pg (ref 26.0–34.0)
MCHC: 31 g/dL (ref 30.0–36.0)
MCV: 88.6 fL (ref 80.0–100.0)
Monocytes Absolute: 0.2 10*3/uL (ref 0.1–1.0)
Monocytes Relative: 9 %
Neutro Abs: 0.1 10*3/uL — CL (ref 1.7–7.7)
Neutrophils Relative %: 6 %
Platelets: 15 10*3/uL — CL (ref 150–400)
RBC: 3.32 MIL/uL — ABNORMAL LOW (ref 4.22–5.81)
RDW: 18.6 % — ABNORMAL HIGH (ref 11.5–15.5)
WBC: 1.8 10*3/uL — ABNORMAL LOW (ref 4.0–10.5)
nRBC: 2.2 % — ABNORMAL HIGH (ref 0.0–0.2)

## 2022-11-15 LAB — COMPREHENSIVE METABOLIC PANEL
ALT: 9 U/L (ref 0–44)
AST: 12 U/L — ABNORMAL LOW (ref 15–41)
Albumin: 3.2 g/dL — ABNORMAL LOW (ref 3.5–5.0)
Alkaline Phosphatase: 62 U/L (ref 38–126)
Anion gap: 6 (ref 5–15)
BUN: 15 mg/dL (ref 6–20)
CO2: 28 mmol/L (ref 22–32)
Calcium: 8.6 mg/dL — ABNORMAL LOW (ref 8.9–10.3)
Chloride: 102 mmol/L (ref 98–111)
Creatinine, Ser: 0.56 mg/dL — ABNORMAL LOW (ref 0.61–1.24)
GFR, Estimated: >60 mL/min (ref >60)
Glucose, Bld: 80 mg/dL (ref 70–99)
Potassium: 3.7 mmol/L (ref 3.5–5.1)
Sodium: 136 mmol/L (ref 135–145)
Total Bilirubin: 0.6 mg/dL (ref 0.3–1.2)
Total Protein: 7.1 g/dL (ref 6.5–8.1)

## 2022-11-15 LAB — VITAMIN B12: Vitamin B-12: 184 pg/mL (ref 180–914)

## 2022-11-15 LAB — FOLATE: Folate: 14.1 ng/mL (ref >5.9)

## 2022-11-15 MED ORDER — IOHEXOL 300 MG/ML  SOLN
75.0000 mL | Freq: Once | INTRAMUSCULAR | Status: AC | PRN
  Administered 2022-11-15: 75 mL via INTRAVENOUS

## 2022-11-15 NOTE — ED Triage Notes (Signed)
BIBA from [facility] for low WBC , has cyst on Lt side of face started in Dec and has progressively gotten worse, AOx4

## 2022-11-15 NOTE — ED Provider Notes (Signed)
[place] EMERGENCY DEPARTMENT AT [facility] Provider Note   CSN: [id number] Arrival date & time: 11/15/22  2030     History  No chief complaint on file.   Franklin Ayers is a 62 y.o. male.  62 yo M with a chief complaint of left-sided neck mass.  This has been there for months.  He was initially referred to our hospital system and had an ultrasound done and some blood work and then he left [place].  He tells me it has not really changed drastically since then he does think it is gotten a little bit bigger.  Denies any significant change in pain denies any difficulty breathing or swallowing.  Denies fevers.  He thinks maybe it got x-rayed since then but denies any other specific workup.  He denies night sweats.  Denies unintentional weight loss.        Home Medications Prior to Admission medications   Medication Sig Start Date End Date Taking? Authorizing Provider  acetaminophen (TYLENOL) 325 MG tablet Take 650 mg by mouth See admin instructions. '650mg'$  three times daily and Can take an additional two tablets every 4 hours as needed for pain  - CANNOT EXCEED 3,000 MG IN A 24-HR PERIOD    [provider]  alum & mag hydroxide-simeth (MI-ACID) 200-200-20 MG/5ML suspension Take 30 mLs by mouth as needed for indigestion or heartburn. Not to exceed 4 doses in 24 hours.    [provider]  antiseptic oral rinse (BIOTENE) LIQD 15 mLs by Mouth Rinse route daily.    [provider]  cholecalciferol (VITAMIN D) 25 MCG (1000 UNIT) tablet Take 1,000 Units by mouth daily.    [provider]  divalproex (DEPAKOTE ER) 500 MG 24 hr tablet Take 1 tablet (500 mg total) by mouth daily with breakfast. Patient taking differently: Take 500-1,000 mg by mouth See admin instructions. Take 500 mg by mouth in the morning and 1,000 mg at bedtime 11/06/17   Bonnita Hollow, MD  lactulose [facility]) 10 GM/15ML solution Take 20 g by mouth daily.     [provider]  loperamide (IMODIUM) 2 MG capsule Take 2 mg by mouth as needed for diarrhea or loose stools (No more than 8 doses in 24 hours.).    [provider]  LORazepam (ATIVAN) 0.5 MG tablet Take 1 tablet (0.5 mg total) by mouth every 8 (eight) hours. Patient taking differently: Take 0.5 mg by mouth 3 (three) times daily.  12/06/17   Gerlene Fee, NP  magnesium hydroxide (MILK OF MAGNESIA) 400 MG/5ML suspension Take 30 mLs by mouth at bedtime as needed (For constipation.).    [provider]  meclizine (ANTIVERT) 25 MG tablet Take 1 tablet (25 mg total) by mouth 3 (three) times daily as needed for dizziness. 06/05/18   Jacqlyn Larsen, PA-C  mirtazapine (REMERON) 30 MG tablet Take 30 mg by mouth at bedtime.    [provider]  Multiple Vitamin (MULTIVITAMIN WITH MINERALS) TABS tablet Take 1 tablet by mouth daily.    [provider]  mupirocin cream (BACTROBAN) 2 % Apply topically daily. Apply until resolution of peri-naval wound Patient not taking: Reported on 12/23/2019 11/06/17   Bonnita Hollow, MD  nystatin cream (MYCOSTATIN) Apply 1 application topically 2 (two) times daily. Apply to scrotum and penis until cleared 12/23/19   Curatolo, Adam, DO  OLANZapine (ZYPREXA) 10 MG tablet Take 10 mg by mouth at bedtime. 11/11/19   [provider]  polyvinyl alcohol (LIQUIFILM TEARS) 1.4 % ophthalmic solution Place 2 drops into both eyes 2 (two) times daily.    [provider]  PRESCRIPTION MEDICATION Take 1 Bottle by mouth 2 (two) times daily as needed (weight loss). Mighty Shakes    [provider]  QUEtiapine (SEROQUEL) 400 MG tablet Take 400 mg by mouth at bedtime.     [provider]  sennosides-docusate sodium (SENOKOT-S) 8.6-50 MG tablet Take 1 tablet by mouth in the morning and at bedtime.    [provider]  tamsulosin (FLOMAX) 0.4 MG CAPS capsule Take 2 capsules (0.8 mg total) by mouth daily. Patient  taking differently: Take 0.4 mg by mouth daily.  11/06/17   Bonnita Hollow, MD  traMADol (ULTRAM) 50 MG tablet Take 1 tablet (50 mg total) by mouth every 6 (six) hours as needed. Patient not taking: Reported on 12/23/2019 10/21/18   Domenic Moras, PA-C      Allergies    Penicillins    Review of Systems   Review of Systems  Physical Exam Updated Vital Signs BP 103/63   Pulse 81   Temp 97.9 F (36.6 C) (Oral)   Resp 18   Ht '5\' 8"'$  (1.727 m)   SpO2 95%   BMI 18.84 kg/m  Physical Exam Vitals and nursing note reviewed.  Constitutional:      Appearance: He is well-developed.  HENT:     Head: Normocephalic and atraumatic.     Comments: Large left-sided neck mass Eyes:     Pupils: Pupils are equal, round, and reactive to light.  Neck:     Vascular: No JVD.  Cardiovascular:     Rate and Rhythm: Normal rate and regular rhythm.     Heart sounds: No murmur heard.    No friction rub. No gallop.  Pulmonary:     Effort: No respiratory distress.     Breath sounds: No wheezing.  Abdominal:     General: There is no distension.     Tenderness: There is no abdominal tenderness. There is no guarding or rebound.  Musculoskeletal:        General: Normal range of motion.     Cervical back: Normal range of motion and neck supple.  Skin:    Coloration: Skin is not pale.     Findings: No rash.  Neurological:     Mental Status: He is alert and oriented to person, place, and time.  Psychiatric:        Behavior: Behavior normal.     ED Results / Procedures / Treatments   Labs (all labs ordered are listed, but only abnormal results are displayed) Labs Reviewed  CBC WITH DIFFERENTIAL/PLATELET - Abnormal; Notable for the following components:      Result Value   WBC 1.8 (*)    RBC 3.32 (*)    Hemoglobin 9.1 (*)    HCT 29.4 (*)    RDW 18.6 (*)    Platelets 15 (*)    nRBC 2.2 (*)    Neutro Abs 0.1 (*)    All other components within normal limits  COMPREHENSIVE METABOLIC PANEL -  Abnormal; Notable for the following components:   Creatinine, Ser 0.56 (*)    Calcium 8.6 (*)    Albumin 3.2 (*)    AST 12 (*)    All other components within normal limits  I-STAT CHEM 8, ED - Abnormal; Notable for the following components:   Creatinine, Ser 0.50 (*)    Hemoglobin 9.9 (*)  HCT 29.0 (*)    All other components within normal limits  VITAMIN B12  TECHNOLOGIST SMEAR REVIEW  FOLATE    EKG None  Radiology CT Soft Tissue Neck W Contrast  Result Date: 11/15/2022 CLINICAL DATA:  Palpable mass in the left neck EXAM: CT NECK WITH CONTRAST TECHNIQUE: Multidetector CT imaging of the neck was performed using the standard protocol following the bolus administration of intravenous contrast. RADIATION DOSE REDUCTION: This exam was performed according to the departmental dose-optimization program which includes automated exposure control, adjustment of the mA and/or kV according to patient size and/or use of iterative reconstruction technique. CONTRAST:  27m OMNIPAQUE IOHEXOL 300 MG/ML  SOLN COMPARISON:  No prior CT neck available, correlation is made with ultrasound 09/26/2022 and CT cervical spine 10/14/2017 FINDINGS: Pharynx and larynx: Normal. No mass or swelling. Salivary glands: Large cystic and solid mass in the left parotid gland, as seen on the recent ultrasound, which measures up to 4.2 x 4.8 x 5.5 cm (AP x TR x CC) (series 7, image 93 and series 6, image 75) in retrospect, this may have been present on the 10/14/2017 CT, where it measured 1.5 x 3.0 x 2.8 cm (AP x TR x CC), but was primarily solid at that time. The right parotid gland and bilateral submandibular glands are unremarkable. Thyroid: Normal. Lymph nodes: Prominent left level 2A lymph node measures up to 1 cm in short axis (series 2, image 45), although this retains normal morphology. No abnormal density lymph nodes. Vascular: Patent. Atherosclerotic calcifications at the bifurcations. Limited intracranial: Negative.  Visualized orbits: Negative. Mastoids and visualized paranasal sinuses: Mucosal thickening in the ethmoid air cells. The mastoids are well aerated. Skeleton: No acute osseous abnormality. Periapical lucency about the remaining maxillary and mandibular teeth. Upper chest: No focal pulmonary opacity or pleural effusion. Emphysema. Other: None. IMPRESSION: 1. 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 Tissue sampling is recommended. 2. Prominent left level 2A lymph node, which retains normal morphology. 3. Periapical lucency about the remaining maxillary and mandibular teeth, concerning for apical periodontitis. Correlate with dental exam. Electronically Signed   By: AMerilyn BabaM.D.   On: 11/15/2022 21:59    Procedures Procedures    Medications Ordered in ED Medications  iohexol (OMNIPAQUE) 300 MG/ML solution 75 mL (75 mLs Intravenous Contrast Given 11/15/22 2123)    ED Course/ Medical Decision Making/ A&P                             Medical Decision Making Amount and/or Complexity of Data Reviewed Labs: ordered. Radiology: ordered.  Risk Prescription drug management.   62yo M with a chief complaint of a left-sided neck mass.  This has been going on for months.  He was seen in January in this hospital system and there was some concern for pancytopenia as well as abnormal findings found on ultrasound and recommended CT imaging.  At that time he left AGAINST MEDICAL ADVICE.  He does have a history of schizophrenia which limits his ability to be seen and followed up.  He is willing to have a CT scan performed today.  Will repeat blood work.  Reassess.  Repeat blood work with persistent pancytopenia.  Platelet count of 15.  Neutropenia.  No fevers no infectious symptoms no bleeding.  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  I discussed  the case with Dr. Claretha Cooper, based on the patient's poor follow-up  recommended hospital admission and more expedited inpatient evaluation.  The patients results and plan were reviewed and discussed.   Any x-rays performed were independently reviewed by myself.   Differential diagnosis were considered with the presenting HPI.  Medications  iohexol (OMNIPAQUE) 300 MG/ML solution 75 mL (75 mLs Intravenous Contrast Given 11/15/22 2123)    Vitals:   11/15/22 2039 11/15/22 2047 11/15/22 2048 11/15/22 2050  BP:  103/63    Pulse:   81 81  Resp:    18  Temp:   97.9 F (36.6 C)   TempSrc:   Oral   SpO2:   96% 95%  Height: '5\' 8"'$  (1.727 m)       Final diagnoses:  Pancytopenia (HCC)  Neck mass    Admission/ observation were discussed with the admitting physician, patient and/or family and they are comfortable with the plan.          Final Clinical Impression(s) / ED Diagnoses Final diagnoses:  Pancytopenia ([place])  Neck mass    Rx / DC Orders ED Discharge Orders     None         Deno Etienne, DO 11/15/22 2227

## 2022-11-15 NOTE — ED Notes (Signed)
Provided pt with a urinal per request of sister at bedside

## 2022-11-15 NOTE — ED Notes (Signed)
Pt back from CT

## 2022-11-15 NOTE — ED Notes (Addendum)
Pt went to CT via stretcher

## 2022-11-16 ENCOUNTER — Observation Stay (HOSPITAL_COMMUNITY): Payer: Medicare Other

## 2022-11-16 ENCOUNTER — Inpatient Hospital Stay (HOSPITAL_COMMUNITY): Payer: Medicare Other

## 2022-11-16 DIAGNOSIS — D61818 Other pancytopenia: Secondary | ICD-10-CM

## 2022-11-16 DIAGNOSIS — C07 Malignant neoplasm of parotid gland: Secondary | ICD-10-CM | POA: Diagnosis present

## 2022-11-16 DIAGNOSIS — F259 Schizoaffective disorder, unspecified: Secondary | ICD-10-CM | POA: Diagnosis not present

## 2022-11-16 DIAGNOSIS — Z79899 Other long term (current) drug therapy: Secondary | ICD-10-CM | POA: Diagnosis not present

## 2022-11-16 DIAGNOSIS — Z823 Family history of stroke: Secondary | ICD-10-CM | POA: Diagnosis not present

## 2022-11-16 DIAGNOSIS — R040 Epistaxis: Secondary | ICD-10-CM | POA: Diagnosis not present

## 2022-11-16 DIAGNOSIS — F17210 Nicotine dependence, cigarettes, uncomplicated: Secondary | ICD-10-CM | POA: Diagnosis present

## 2022-11-16 DIAGNOSIS — D490 Neoplasm of unspecified behavior of digestive system: Secondary | ICD-10-CM

## 2022-11-16 DIAGNOSIS — F319 Bipolar disorder, unspecified: Secondary | ICD-10-CM | POA: Diagnosis present

## 2022-11-16 DIAGNOSIS — N400 Enlarged prostate without lower urinary tract symptoms: Secondary | ICD-10-CM | POA: Diagnosis not present

## 2022-11-16 DIAGNOSIS — E538 Deficiency of other specified B group vitamins: Secondary | ICD-10-CM | POA: Diagnosis present

## 2022-11-16 DIAGNOSIS — F250 Schizoaffective disorder, bipolar type: Secondary | ICD-10-CM

## 2022-11-16 DIAGNOSIS — C9200 Acute myeloblastic leukemia, not having achieved remission: Secondary | ICD-10-CM | POA: Diagnosis present

## 2022-11-16 DIAGNOSIS — R221 Localized swelling, mass and lump, neck: Secondary | ICD-10-CM | POA: Diagnosis present

## 2022-11-16 DIAGNOSIS — T426X5A Adverse effect of other antiepileptic and sedative-hypnotic drugs, initial encounter: Secondary | ICD-10-CM | POA: Diagnosis present

## 2022-11-16 DIAGNOSIS — Z880 Allergy status to penicillin: Secondary | ICD-10-CM | POA: Diagnosis not present

## 2022-11-16 DIAGNOSIS — D61811 Other drug-induced pancytopenia: Secondary | ICD-10-CM | POA: Diagnosis present

## 2022-11-16 LAB — TYPE AND SCREEN
ABO/RH(D): O POS
Antibody Screen: NEGATIVE

## 2022-11-16 LAB — IRON AND TIBC
Iron: 31 ug/dL — ABNORMAL LOW (ref 45–182)
Saturation Ratios: 11 % — ABNORMAL LOW (ref 17.9–39.5)
TIBC: 276 ug/dL (ref 250–450)
UIBC: 245 ug/dL

## 2022-11-16 LAB — HIV ANTIBODY (ROUTINE TESTING W REFLEX): HIV Screen 4th Generation wRfx: NONREACTIVE

## 2022-11-16 LAB — VALPROIC ACID LEVEL: Valproic Acid Lvl: 41 ug/mL — ABNORMAL LOW (ref 50.0–100.0)

## 2022-11-16 LAB — HEPATITIS PANEL, ACUTE
HCV Ab: NONREACTIVE
Hep A IgM: NONREACTIVE
Hep B C IgM: NONREACTIVE
Hepatitis B Surface Ag: NONREACTIVE

## 2022-11-16 LAB — DIC (DISSEMINATED INTRAVASCULAR COAGULATION)PANEL
D-Dimer, Quant: 0.85 ug/mL-FEU — ABNORMAL HIGH (ref 0.00–0.50)
Fibrinogen: 380 mg/dL (ref 210–475)
INR: 1.3 — ABNORMAL HIGH (ref 0.8–1.2)
Platelets: 16 10*3/uL — CL (ref 150–400)
Prothrombin Time: 15.9 seconds — ABNORMAL HIGH (ref 11.4–15.2)
aPTT: 30 seconds (ref 24–36)

## 2022-11-16 LAB — ABO/RH: ABO/RH(D): O POS

## 2022-11-16 LAB — RETICULOCYTES
Immature Retic Fract: 28.8 % — ABNORMAL HIGH (ref 2.3–15.9)
RBC.: 4.05 MIL/uL — ABNORMAL LOW (ref 4.22–5.81)
Retic Count, Absolute: 138.9 10*3/uL (ref 19.0–186.0)
Retic Ct Pct: 3.4 % — ABNORMAL HIGH (ref 0.4–3.1)

## 2022-11-16 LAB — MRSA NEXT GEN BY PCR, NASAL: MRSA by PCR Next Gen: NOT DETECTED

## 2022-11-16 LAB — FERRITIN: Ferritin: 66 ng/mL (ref 24–336)

## 2022-11-16 MED ORDER — IOHEXOL 300 MG/ML  SOLN
100.0000 mL | Freq: Once | INTRAMUSCULAR | Status: AC | PRN
  Administered 2022-11-16: 100 mL via INTRAVENOUS

## 2022-11-16 MED ORDER — LIDOCAINE HCL 1 % IJ SOLN
INTRAMUSCULAR | Status: AC
  Filled 2022-11-16: qty 20

## 2022-11-16 MED ORDER — LORAZEPAM 0.5 MG PO TABS
0.5000 mg | ORAL_TABLET | Freq: Two times a day (BID) | ORAL | Status: DC
  Administered 2022-11-16 – 2022-11-18 (×6): 0.5 mg via ORAL
  Filled 2022-11-16 (×6): qty 1

## 2022-11-16 MED ORDER — TAMSULOSIN HCL 0.4 MG PO CAPS
0.4000 mg | ORAL_CAPSULE | Freq: Every day | ORAL | Status: DC
  Administered 2022-11-17 – 2022-11-18 (×2): 0.4 mg via ORAL
  Filled 2022-11-16 (×3): qty 1

## 2022-11-16 MED ORDER — ADULT MULTIVITAMIN W/MINERALS CH
1.0000 | ORAL_TABLET | Freq: Every day | ORAL | Status: DC
  Administered 2022-11-16 – 2022-11-18 (×3): 1 via ORAL
  Filled 2022-11-16 (×3): qty 1

## 2022-11-16 MED ORDER — CYANOCOBALAMIN 1000 MCG/ML IJ SOLN
1000.0000 ug | Freq: Every day | INTRAMUSCULAR | Status: DC
  Administered 2022-11-16 – 2022-11-17 (×2): 1000 ug via INTRAMUSCULAR
  Filled 2022-11-16 (×3): qty 1

## 2022-11-16 MED ORDER — SODIUM CHLORIDE 0.9% IV SOLUTION: Freq: Once | INTRAVENOUS | Status: AC

## 2022-11-16 MED ORDER — OLANZAPINE 10 MG PO TABS
20.0000 mg | ORAL_TABLET | Freq: Every day | ORAL | Status: DC
  Administered 2022-11-16 – 2022-11-17 (×2): 20 mg via ORAL
  Filled 2022-11-16 (×2): qty 2

## 2022-11-16 MED ORDER — IOHEXOL 9 MG/ML PO SOLN
ORAL | Status: AC
  Filled 2022-11-16: qty 1000

## 2022-11-16 MED ORDER — LACTATED RINGERS IV SOLN: INTRAVENOUS | Status: AC

## 2022-11-16 MED ORDER — CLONAZEPAM 0.5 MG PO TABS: 0.5000 mg | ORAL_TABLET | Freq: Two times a day (BID) | ORAL | Status: DC | PRN

## 2022-11-16 MED ORDER — ALBUTEROL SULFATE (2.5 MG/3ML) 0.083% IN NEBU: 3.0000 mL | INHALATION_SOLUTION | Freq: Four times a day (QID) | RESPIRATORY_TRACT | Status: DC | PRN

## 2022-11-16 MED ORDER — ACETAMINOPHEN 325 MG PO TABS
650.0000 mg | ORAL_TABLET | Freq: Once | ORAL | Status: AC
  Administered 2022-11-16: 650 mg via ORAL
  Filled 2022-11-16: qty 2

## 2022-11-16 MED ORDER — DIPHENHYDRAMINE HCL 25 MG PO CAPS
25.0000 mg | ORAL_CAPSULE | Freq: Once | ORAL | Status: AC
  Administered 2022-11-16: 25 mg via ORAL
  Filled 2022-11-16: qty 1

## 2022-11-16 MED ORDER — IOHEXOL 9 MG/ML PO SOLN
500.0000 mL | ORAL | Status: AC
  Administered 2022-11-16: 500 mL via ORAL

## 2022-11-16 MED ORDER — NICOTINE 21 MG/24HR TD PT24
21.0000 mg | MEDICATED_PATCH | Freq: Every day | TRANSDERMAL | Status: DC
  Administered 2022-11-16 – 2022-11-17 (×2): 21 mg via TRANSDERMAL
  Filled 2022-11-16 (×3): qty 1

## 2022-11-16 NOTE — Consult Note (Signed)
[facility] Face-to-Face Psychiatric Evaluation   Service Date: November 16, 2022 LOS:  LOS: 0 days  Reason for Consult: Concern for agranulocytosis Consult by: Jennette Kettle  Assessment  Naitik Astacio is a 62 y.o. male w/ hx of schizophrenia, catatonia, and admitted medically for 11/15/2022  8:32 PM for pancytopenia and concern for malignancy.  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 Per sister and group home administrator, patient has been psychiatrically stable for the past few months.   Based on initial assessment, he appears to be psychiatrically stable at present. Given likelihood that metastatic disease is leading to pancytopenia, we will cautiously resume certain psychiatric medications in order to prevent psychosis starting with olanzapine.   Plan  ## Safety and Observation Level:  - Based on my clinical evaluation, I estimate the patient to be at low risk of self harm in the current setting   Schizophrenia --Restart zyprexa 20 mg qhs for psychosis --Restart ativan 0.5 mg bid --Continue clonazepam 0.5 mg bid prn  --Holding depakote given risk for thrombocytopenia --holding mirtazapine and quetiapine given mild risk of neutropenia and uncertain if patient requires these medications Pertinent labs: platelets 15, WBC 1.8, Hgb 9.1  ## Disposition:  -- per primary team  Thank you for this consult request. Recommendations have been communicated to the primary team.  We will continue to follow at this time.   France Ravens, MD   Relevant History  Relevant Aspects of Hospital Course:  Admitted on 11/15/2022 for pancytopenia and concern for malignancy.  Patient Report:  Patient was seen and assessed at bedside.  Reports feeling "ok". AxOx4 and able to perform DOWB. States he has been doing well for past several months at [facility] where he resides. He states he primarily socializes with staff there and his sister will visit him once a week. He denies present SI/HI/AVH. He is vague about his psychiatric history stating he has been to psychiatric unit for "safety" or "dark thoughts" but does not go into detail. He feels safe while at the hospital and while he is living at [facility]. He denies present feelings of anxiety. He states he sleeps and sleeps well. He denies ever attempting self-harm or harming others.  ROS:  Denies Si?HI/AVH  Collateral information:  Sister Mandip Frame) [phone] Sister visits patient at least once a week at [facility]. She states he has been relatively stable for past several months but has had fluctuations in mood and odd behaviors for a long period of time. Odd behavior include dressing bizarrely and leaving facility at times. She adamantly states that while he has odd behaviors, he has never shown any aggression or attempted self-harm in any way that she knows of. She stayed with patient most of last night and she feels he is currently psychiatrically stable.  [name] Production designer, theatre/television/film of [place]) [phone]  [name] notes that patient for past few months has been calmer. Couple months ago, he was irate and would leave facility for 3 days at a time stating that he had friends "out there" and that he did not like being in closed walls. He would also refuse medications and have bizarre behaviors where he will put on 5-6 layers of clothes for no apparent reason. He  is allowed to do this given he is his own guardian. He has not had any problems with irritability or agitation for past few months. No changes to medications have been done in past several months with the exception of receiving the zyprexa LAI Relprev in January before  it was recalled.    Past Psychiatric Hx: Previous Psych Diagnoses: schizoaffective disorder/schizophrenia Prior inpatient treatment: multiple inpt hospitalization, last [facility] admission 2018 History of suicide: denies History of homicide: denies Psychiatric medication compliance history:fair  Social History: hx of smoking but denies illicit substance use   Family History:  The patient's family history includes Cancer in an other family member; Stroke in an other family member.  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[place])    Cystitis    Dysphagia    Hypokalemia    Manic depression ([place])    Mental disorder    Schizoaffective disorder ([place])     Surgical History: Past Surgical History:  Procedure Laterality Date   TONSILLECTOMY      Medications:   Current Facility-Administered Medications:    0.9 %  sodium chloride infusion (Manually program via Guardrails IV Fluids), , Intravenous, Once, [name], Ni, MD   acetaminophen (TYLENOL) tablet 650 mg, 650 mg, Oral, Once, Gorsuch, Ni, MD   albuterol (PROVENTIL) (2.5 MG/3ML) 0.083% nebulizer solution 3 mL, 3 mL, Inhalation, Q6H PRN, Alcario Drought, Jared M, DO   clonazePAM (KLONOPIN) tablet 0.5 mg, 0.5 mg, Oral, BID PRN, Alcario Drought, Jared M, DO   cyanocobalamin (VITAMIN B12) injection 1,000 mcg, 1,000 mcg, Intramuscular, Daily, Gorsuch, Ni, MD   diphenhydrAMINE (BENADRYL) capsule 25 mg, 25 mg, Oral, Once, Gorsuch, Ni, MD   iohexol (OMNIPAQUE) 9 MG/ML oral solution, , , ,    lidocaine (XYLOCAINE) 1 % (with pres) injection, , , ,    LORazepam (ATIVAN) tablet 0.5 mg, 0.5 mg, Oral, BID, Alcario Drought, Jared M, DO, 0.5 mg at 11/16/22 1004   multivitamin with minerals tablet 1 tablet, 1 tablet, Oral, Daily, Alcario Drought, Jared M, DO, 1 tablet at 11/16/22 1004   tamsulosin (FLOMAX) capsule 0.4 mg, 0.4 mg, Oral, Daily, Jennette Kettle M, DO  Allergies: Allergies   Allergen Reactions   Penicillins Shortness Of Breath    .Has patient had a PCN reaction causing immediate rash, facial/tongue/throat swelling, SOB or lightheadedness with hypotension: Yes Has patient had a PCN reaction causing severe rash involving mucus membranes or skin necrosis: yes Has patient had a PCN reaction that required hospitalization: no Has patient had a PCN reaction occurring within the last 10 years: no If all of the above answers are "NO", then may proceed with Cephalosporin use.        Objective  Vital signs:  Temp:  [97.9 F (36.6 C)-99 F (37.2 C)] 99 F (37.2 C) (03/06 1021) Pulse Rate:  [80-99] 99 (03/06 1021) Resp:  [18-19] 18 (03/06 1021) BP: (103-117)/(63-81) 117/73 (03/06 1021) SpO2:  [95 %-97 %] 96 % (03/06 1021)  Psychiatric Specialty Exam:  Presentation  General Appearance:  Casual  Eye Contact: Fair  Speech: Clear and Coherent  Speech Volume: Normal   Mood and Affect  Mood: Euthymic  Affect: Congruent   Thought Process  Thought Processes: Coherent  Descriptions of Associations: logical Orientation:Full (Time, Place and Person)  Thought Content:WDL  Hallucinations: denies Suicidal Thoughts:denies Homicidal Thoughts:denies  Sensorium  Memory: Immediate Fair  Judgment: Intact  Insight: Present   Community education officer  Concentration: Fair  Attention Span: Fair  Recall: [employer] of Knowledge: Fair  Language: Fair   Psychomotor Activity  Psychomotor Activity: normal  Assets  Assets: Housing; Leisure Time; Social Support   Sleep  Sleep: fair   Physical Exam: Physical Exam Vitals and nursing note reviewed.

## 2022-11-16 NOTE — TOC Initial Note (Signed)
Transition of Care [facility]) - Initial/Assessment Note   Patient Details  Name: Franklin Ayers MRN: [id number] Date of Birth: 11-23-1960  [facility] of [facility] CM/SW Contact:    Sherie Don, LCSW Phone Number: 11/16/2022, 1:25 PM  Clinical Narrative: Patient is a resident of [facility]. TOC to follow for discharge needs.  Expected Discharge Plan: Assisted Living Barriers to Discharge: Continued Medical Work up  Expected Discharge Plan and Services In-house Referral: Clinical Social Work Post Acute Care Choice:  ([facility]) Living arrangements for the past 2 months: [place]  Prior Living Arrangements/Services Living arrangements for the past 2 months: [place] Lives with:: Facility Resident Patient language and need for interpreter reviewed:: Yes Need for Family Participation in Patient Care: Yes (Comment) Care giver support system in place?: Yes (comment) Criminal Activity/Legal Involvement Pertinent to Current Situation/Hospitalization: No - Comment as needed  Activities of Daily Living ADL Screening (condition at time of admission) Patient's cognitive ability adequate to safely complete daily activities?: Yes Is the patient deaf or have difficulty hearing?: No Does the patient have difficulty seeing, even when wearing glasses/contacts?: No Does the patient have difficulty concentrating, remembering, or making decisions?: Yes Patient able to express need for assistance with ADLs?: Yes Does the patient have difficulty dressing or bathing?: No Independently performs ADLs?: Yes (appropriate for developmental age) Does the patient have difficulty walking or climbing stairs?: No Weakness of Legs: None Weakness of Arms/Hands: None  Emotional Assessment Orientation: : Oriented to Self, Oriented to Place, Oriented to  Time, Oriented to Situation Alcohol / Substance Use: Not Applicable Psych Involvement: Yes (comment)  Admission  diagnosis:  Neck mass [R22.1] Pancytopenia ([place]) [D61.818] Patient Active Problem List   Diagnosis Date Noted   Pancytopenia ([place]) 11/15/2022   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   Bacterial infection due to Morganella morganii 11/20/2017   Fever of unknown origin 11/16/2017   Physical deconditioning 11/08/2017   Benign prostatic hyperplasia    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 [place] [address] Phone: [phone] Fax: [phone]  Social Determinants of Health (SDOH) Social History: SDOH Screenings   Alcohol Screen: Low Risk  (07/26/2017)  Tobacco Use: Medium Risk (11/15/2022)   SDOH Interventions:    Readmission Risk Interventions     No data to display

## 2022-11-16 NOTE — Consult Note (Signed)
Chief Complaint: Patient was seen in consultation today for image guided bone marrow biopsy/image guided left parotid mass biopsy  Referring Physician(s): Gorsuch,N  Supervising Physician: Daryll Brod  Patient Status: [facility] - In-pt  History of Present Illness: Franklin Ayers is a 62 y.o. male , ex smoker, with past medical history of BPH,  schizophrenia, manic depression who was admitted to [facility] on 11/15/22 with enlarging left parotid mass and severe pancytopenia.  Patient has had left parotid mass present for many months and was sent to the emergency department a month ago and subsequently left AMA. He was sent back to the ED yesterday for the above complaints. He lives in a group home.  CT of the neck performed yesterday revealed:  1. 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 Tissue sampling is recommended. 2. Prominent left level 2A lymph node, which retains normal morphology. 3. Periapical lucency about the remaining maxillary and mandibular teeth, concerning for apical periodontitis.    CT chest abdomen pelvis today revealed:  1. No evidence metastatic disease in the abdomen pelvis. 2. Mild mucosal enhancement of the bladder mucosa, prostatic urethra, corpus callosum, and RIGHT lobe of the prostate gland. Recommend correlation for cystitis/prostatitis with urinalysis. Additionally recommend serum PSA evaluation  Current labs include negative acute hepatitis panel, PT 15.9, INR 1.3, platelets 16 K, WBC 1.8, hemoglobin 9.1, creatinine 0.56.  Currently receiving platelet transfusion.  Request now received from oncology for image guided left parotid mass and bone marrow biopsies.   Past Medical History:  Diagnosis Date   Anemia    BPH (benign prostatic hyperplasia)    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HCC)    Cystitis    Dysphagia    Hypokalemia    Manic depression (HCC)     Mental disorder    Schizoaffective disorder ([place])     Past Surgical History:  Procedure Laterality Date   TONSILLECTOMY      Allergies: Penicillins  Medications: Prior to Admission medications   Medication Sig Start Date End Date Taking? Authorizing Provider  albuterol (VENTOLIN HFA) 108 (90 Base) MCG/ACT inhaler Inhale 2 puffs into the lungs every 6 (six) hours as needed for wheezing or shortness of breath. 09/15/22  Yes [provider]  cholecalciferol (VITAMIN D) 25 MCG (1000 UNIT) tablet Take 1,000 Units by mouth daily.   Yes [provider]  clonazePAM (KLONOPIN) 0.5 MG tablet Take 0.5 mg by mouth 2 (two) times daily as needed for anxiety. 11/14/22  Yes [provider]  divalproex (DEPAKOTE) 500 MG DR tablet Take 500-1,000 mg by mouth 2 (two) times daily. Take 1 tablet (500 mg) by mouth daily and Take 2 tablets (1000 mg) at bedtime 11/10/22  Yes [provider]  LORazepam (ATIVAN) 0.5 MG tablet Take 1 tablet (0.5 mg total) by mouth every 8 (eight) hours. Patient taking differently: Take 0.5 mg by mouth 2 (two) times daily. 12/06/17  Yes Gerlene Fee, NP  mirtazapine (REMERON) 30 MG tablet Take 30 mg by mouth at bedtime.   Yes [provider]  Multiple Vitamin (MULTIVITAMIN WITH MINERALS) TABS tablet Take 1 tablet by mouth daily.   Yes [provider]  OLANZapine zydis (ZYPREXA) 20 MG disintegrating tablet Take 20 mg by mouth daily. 11/10/22  Yes [provider]  QUEtiapine (SEROQUEL) 400 MG tablet Take 400 mg by mouth at bedtime.    Yes [provider]  sennosides-docusate sodium (  SENOKOT-S) 8.6-50 MG tablet Take 1 tablet by mouth in the morning and at bedtime.   Yes [provider]  tamsulosin (FLOMAX) 0.4 MG CAPS capsule Take 2 capsules (0.8 mg total) by mouth daily. Patient taking differently: Take 0.4 mg by mouth daily. 11/06/17  Yes Bonnita Hollow, MD     Family History  Problem Relation Age of  Onset   Cancer Other    Stroke Other     Social History   Socioeconomic History   Marital status: Single    Spouse name: Not on file   Number of children: Not on file   Years of education: Not on file   Highest education level: Not on file  Occupational History   Not on file  Tobacco Use   Smoking status: Former    Packs/day: 2.00    Types: Cigarettes   Smokeless tobacco: Never   Tobacco comments:    refused  Vaping Use   Vaping Use: Never used  Substance and Sexual Activity   Alcohol use: No    Comment: occasional   Drug use: No   Sexual activity: Never  Other Topics Concern   Not on file  Social History Narrative   Not on file   Social Determinants of Health   Financial Resource Strain: Not on file  Food Insecurity: Not on file  Transportation Needs: Not on file  Physical Activity: Not on file  Stress: Not on file  Social Connections: Not on file      Review of Systems denies fever, chest pain, worsening dyspnea, cough, abdominal/back pain, nausea, vomiting.  Had some recent epistaxis.  Also with some left ear/neck discomfort secondary to parotid mass  Vital Signs: BP 118/70   Pulse 94   Temp 99.1 F (37.3 C) (Oral)   Resp (!) 118   Ht '5\' 8"'$  (1.727 m)   SpO2 96%   BMI 18.84 kg/m     Physical Exam awake, answering questions okay but with some repetition, sister in room.  Chest clear to auscultation bilaterally.  Heart with regular rate and rhythm.  Abdomen soft, positive bowel sounds, nontender.  No lower extremity edema.  Large left parotid mass, some tenderness to palpation.  Imaging: CT CHEST ABDOMEN PELVIS W CONTRAST  Result Date: 11/16/2022 CLINICAL DATA:  Metastatic disease. Pancytopenia. Evaluate parotid mass. * Tracking Code: BO * EXAM: CT CHEST, ABDOMEN, AND PELVIS WITH CONTRAST TECHNIQUE: Multidetector CT imaging of the chest, abdomen and pelvis was performed following the standard protocol during bolus administration of intravenous  contrast. RADIATION DOSE REDUCTION: This exam was performed according to the departmental dose-optimization program which includes automated exposure control, adjustment of the mA and/or kV according to patient size and/or use of iterative reconstruction technique. CONTRAST:  14m OMNIPAQUE IOHEXOL 300 MG/ML  SOLN COMPARISON:  None Available. FINDINGS: CT CHEST FINDINGS Cardiovascular: No significant vascular findings. Normal heart size. No pericardial effusion. Mediastinum/Nodes: No axillary or supraclavicular adenopathy. No mediastinal or hilar adenopathy. No pericardial fluid. Esophagus normal. Lungs/Pleura: Airways normal. No endobronchial lesion. No pulmonary nodules. Musculoskeletal: No aggressive osseous lesion. CT ABDOMEN AND PELVIS FINDINGS Hepatobiliary: No focal hepatic lesion. No biliary ductal dilatation. Gallbladder is normal. Common bile duct is normal. Pancreas: Pancreas is normal. No ductal dilatation. No pancreatic inflammation. Spleen: Normal spleen Adrenals/urinary tract: Adrenal glands and kidneys are normal. Ureters normal. Mild mucosal enhancement within the bladder. Stomach/Bowel: Stomach, small bowel, appendix, and cecum are normal. The colon and rectosigmoid colon are normal. Vascular/Lymphatic: Abdominal aorta is  normal caliber. There is no retroperitoneal or periportal lymphadenopathy. No pelvic lymphadenopathy. Reproductive: Asymmetric enhancement in the RIGHT lobe of the prostate gland measures 2.7 x 1.8 cm (image 116/2). Enhancement within the prostatic urethra and corpus callosum of the penis additionally. Other: No free fluid. Musculoskeletal: No aggressive osseous lesion. IMPRESSION: CHEST IMPRESSION: No evidence of thoracic metastasis. PELVIS IMPRESSION: 1. No evidence metastatic disease in the abdomen pelvis. 2. Mild mucosal enhancement of the bladder mucosa, prostatic urethra, corpus callosum, and RIGHT lobe of the prostate gland. Recommend correlation for cystitis/prostatitis  with urinalysis. Additionally recommend serum PSA evaluation. Electronically Signed   By: Suzy Bouchard M.D.   On: 11/16/2022 15:07   CT Soft Tissue Neck W Contrast  Result Date: 11/15/2022 CLINICAL DATA:  Palpable mass in the left neck EXAM: CT NECK WITH CONTRAST TECHNIQUE: Multidetector CT imaging of the neck was performed using the standard protocol following the bolus administration of intravenous contrast. RADIATION DOSE REDUCTION: This exam was performed according to the departmental dose-optimization program which includes automated exposure control, adjustment of the mA and/or kV according to patient size and/or use of iterative reconstruction technique. CONTRAST:  70m OMNIPAQUE IOHEXOL 300 MG/ML  SOLN COMPARISON:  No prior CT neck available, correlation is made with ultrasound 09/26/2022 and CT cervical spine 10/14/2017 FINDINGS: Pharynx and larynx: Normal. No mass or swelling. Salivary glands: Large cystic and solid mass in the left parotid gland, as seen on the recent ultrasound, which measures up to 4.2 x 4.8 x 5.5 cm (AP x TR x CC) (series 7, image 93 and series 6, image 75) in retrospect, this may have been present on the 10/14/2017 CT, where it measured 1.5 x 3.0 x 2.8 cm (AP x TR x CC), but was primarily solid at that time. The right parotid gland and bilateral submandibular glands are unremarkable. Thyroid: Normal. Lymph nodes: Prominent left level 2A lymph node measures up to 1 cm in short axis (series 2, image 45), although this retains normal morphology. No abnormal density lymph nodes. Vascular: Patent. Atherosclerotic calcifications at the bifurcations. Limited intracranial: Negative. Visualized orbits: Negative. Mastoids and visualized paranasal sinuses: Mucosal thickening in the ethmoid air cells. The mastoids are well aerated. Skeleton: No acute osseous abnormality. Periapical lucency about the remaining maxillary and mandibular teeth. Upper chest: No focal pulmonary opacity or  pleural effusion. Emphysema. Other: None. IMPRESSION: 1. 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 Tissue sampling is recommended. 2. Prominent left level 2A lymph node, which retains normal morphology. 3. Periapical lucency about the remaining maxillary and mandibular teeth, concerning for apical periodontitis. Correlate with dental exam. Electronically Signed   By: AMerilyn BabaM.D.   On: 11/15/2022 21:59    Labs:  CBC: Recent Labs    09/26/22 1342 11/15/22 2100 11/15/22 2106 11/16/22 0320  WBC 2.2* 1.8*  --   --   HGB 11.7* 9.1* 9.9*  --   HCT 37.3* 29.4* 29.0*  --   PLT 42* 15*  --  16*    COAGS: Recent Labs    11/16/22 0320  INR 1.3*  APTT 30    BMP: Recent Labs    09/26/22 1342 11/15/22 2100 11/15/22 2106  NA 134* 136 138  K 3.9 3.7 3.8  CL 100 102 99  CO2 25 28  --   GLUCOSE 91 80 79  BUN '14 15 14  '$ CALCIUM 9.3 8.6*  --  CREATININE 0.54* 0.56* 0.50*  GFRNONAA >60 >60  --     LIVER FUNCTION TESTS: Recent Labs    11/15/22 2100  BILITOT 0.6  AST 12*  ALT 9  ALKPHOS 62  PROT 7.1  ALBUMIN 3.2*    TUMOR MARKERS: No results for input(s): "AFPTM", "CEA", "CA199", "CHROMGRNA" in the last 8760 hours.  Assessment and Plan: 62 y.o. male , ex smoker, with past medical history of BPH,  schizophrenia, manic depression who was admitted to [facility] on 11/15/22 with enlarging left parotid mass and severe pancytopenia.  Patient has had left parotid mass present for many months and was sent to the emergency department a month ago and subsequently left AMA. He was sent back to the ED yesterday for the above complaints. He lives in a group home.  CT of the neck performed yesterday revealed:  1. 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 Tissue sampling is recommended. 2. Prominent left level  2A lymph node, which retains normal morphology. 3. Periapical lucency about the remaining maxillary and mandibular teeth, concerning for apical periodontitis.    CT chest abdomen pelvis today revealed:  1. No evidence metastatic disease in the abdomen pelvis. 2. Mild mucosal enhancement of the bladder mucosa, prostatic urethra, corpus callosum, and RIGHT lobe of the prostate gland. Recommend correlation for cystitis/prostatitis with urinalysis. Additionally recommend serum PSA evaluation  Current labs include negative acute hepatitis panel, PT 15.9, INR 1.3, platelets 16 K, WBC 1.8, hemoglobin 9.1, creatinine 0.56.  Currently receiving platelet transfusion.  Request now received from oncology for image guided left parotid mass and bone marrow biopsies.  Imaging studies were reviewed by Dr. Vernard Gambles.  Would need platelet count higher than 15 K to safely perform parotid biopsy.  Currently receiving platelet transfusion.  Will recheck labs in [facility] and benefits of procedures were discussed with the patient and/or patient's family including, but not limited to bleeding, infection, damage to adjacent structures or low yield requiring additional tests.  All of the questions were answered and there is agreement to proceed.  Consent signed and in chart.  Procedures tentatively scheduled for 3/7.    Thank you for this interesting consult.  I greatly enjoyed meeting Franklin Ayers and look forward to participating in their care.  A copy of this report was sent to the requesting provider on this date.  Electronically Signed: D. Rowe Robert, PA-C 11/16/2022, 3:59 PM   I spent a total of  25 minutes   in face to face in clinical consultation, greater than 50% of which was counseling/coordinating care for image guided left parotid mass and bone marrow biopsies

## 2022-11-16 NOTE — Consult Note (Incomplete)
[facility] Consult   Reason for Consult:  *** Referring Physician:  *** Patient Identification: Franklin Ayers MRN:  [id number] Principal Diagnosis: Pancytopenia ([place]) Diagnosis:  Principal Problem:   Pancytopenia ([place]) Active Problems:   Schizoaffective disorder ([place])   Tumor of parotid gland   Total Time spent with patient: {Time; 15 min - 8 hours:17441}  Subjective:   Franklin Ayers is a 62 y.o. male patient admitted with ***.  HPI:   Franklin Ayers is a 62 y.o. male with medical history significant of Schizophrenia / schizoaffective disorder.  He resides in SNF at baseline.   Pt sent in to ED from his facility today due to: 1) low WBC 2) progressively enlarging cyst / mass on L side of face Past Psychiatric History: ***  Risk to Self:   Risk to Others:   Prior Inpatient Therapy:   Prior Outpatient Therapy:    Past Medical History:  Past Medical History:  Diagnosis Date   Anemia    BPH (benign prostatic hyperplasia)    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HCC)    Cystitis    Dysphagia    Hypokalemia    Manic depression ([place])    Mental disorder    Schizoaffective disorder (HCC)     Past Surgical History:  Procedure Laterality Date   TONSILLECTOMY     Family History:  Family History  Problem Relation Age of Onset   Cancer Other    Stroke Other    Family Psychiatric  History: *** Social History:  Social History   Substance and Sexual Activity  Alcohol Use No   Comment: occasional     Social History   Substance and Sexual Activity  Drug Use No    Social History   Socioeconomic History   Marital status: Single    Spouse name: Not on file   Number of children: Not on file   Years of education: Not on file   Highest education level: Not on file  Occupational History   Not on file  Tobacco Use   Smoking status: Former    Packs/day: 2.00    Types: Cigarettes   Smokeless tobacco: Never   Tobacco comments:    refused  Vaping Use    Vaping Use: Never used  Substance and Sexual Activity   Alcohol use: No    Comment: occasional   Drug use: No   Sexual activity: Never  Other Topics Concern   Not on file  Social History Narrative   Not on file   Social Determinants of Health   Financial Resource Strain: Not on file  Food Insecurity: Not on file  Transportation Needs: Not on file  Physical Activity: Not on file  Stress: Not on file  Social Connections: Not on file   Additional Social History:    Allergies:   Allergies  Allergen Reactions   Penicillins Shortness Of Breath    .Has patient had a PCN reaction causing immediate rash, facial/tongue/throat swelling, SOB or lightheadedness with hypotension: Yes Has patient had a PCN reaction causing severe rash involving mucus membranes or skin necrosis: yes Has patient had a PCN reaction that required hospitalization: no Has patient had a PCN reaction occurring within the last 10 years: no If all of the above answers are "NO", then may proceed with Cephalosporin use.     Labs:  Results for orders placed or performed during the hospital encounter of 11/15/22 (from the past 48 hour(s))  CBC with Differential     Status: Abnormal  Collection Time: 11/15/22  9:00 PM  Result Value Ref Range   WBC 1.8 (L) 4.0 - 10.5 K/uL   RBC 3.32 (L) 4.22 - 5.81 MIL/uL   Hemoglobin 9.1 (L) 13.0 - 17.0 g/dL   HCT 29.4 (L) 39.0 - 52.0 %   MCV 88.6 80.0 - 100.0 fL   MCH 27.4 26.0 - 34.0 pg   MCHC 31.0 30.0 - 36.0 g/dL   RDW 18.6 (H) 11.5 - 15.5 %   Platelets 15 (LL) 150 - 400 K/uL    Comment: SPECIMEN CHECKED FOR CLOTS Immature Platelet Fraction may be clinically indicated, consider ordering this additional test [id number] REPEATED TO VERIFY PLATELET COUNT CONFIRMED BY SMEAR    nRBC 2.2 (H) 0.0 - 0.2 %   Neutrophils Relative % 6 %   Neutro Abs 0.1 (LL) 1.7 - 7.7 K/uL    Comment: This critical result has verified and been called to [facility], RN by Ollen Barges on 03 05  2024 at 2134, and has been read back. CRITICAL RESULT CALLED AND VERIFIED   Lymphocytes Relative 80 %   Lymphs Abs 1.5 0.7 - 4.0 K/uL   Monocytes Relative 9 %   Monocytes Absolute 0.2 0.1 - 1.0 K/uL   Eosinophils Relative 2 %   Eosinophils Absolute 0.0 0.0 - 0.5 K/uL   Basophils Relative 1 %   Basophils Absolute 0.0 0.0 - 0.1 K/uL   Immature Granulocytes 2 %   Abs Immature Granulocytes 0.03 0.00 - 0.07 K/uL   Polychromasia PRESENT    Ovalocytes PRESENT     Comment: Performed at [facility], [place] [address], [place], [place] [address]  Comprehensive metabolic panel     Status: Abnormal   Collection Time: 11/15/22  9:00 PM  Result Value Ref Range   Sodium 136 135 - 145 mmol/L   Potassium 3.7 3.5 - 5.1 mmol/L   Chloride 102 98 - 111 mmol/L   CO2 28 22 - 32 mmol/L   Glucose, Bld 80 70 - 99 mg/dL    Comment: Glucose reference range applies only to samples taken after fasting for at least 8 hours.   BUN 15 6 - 20 mg/dL   Creatinine, Ser 0.56 (L) 0.61 - 1.24 mg/dL   Calcium 8.6 (L) 8.9 - 10.3 mg/dL   Total Protein 7.1 6.5 - 8.1 g/dL   Albumin 3.2 (L) 3.5 - 5.0 g/dL   AST 12 (L) 15 - 41 U/L   ALT 9 0 - 44 U/L   Alkaline Phosphatase 62 38 - 126 U/L   Total Bilirubin 0.6 0.3 - 1.2 mg/dL   GFR, Estimated >60 >60 mL/min    Comment: (NOTE) Calculated using the CKD-EPI Creatinine Equation (2021)    Anion gap 6 5 - 15    Comment: Performed at [facility], [place] [address], [place], [place] [address]  Technologist smear review     Status: None   Collection Time: 11/15/22  9:00 PM  Result Value Ref Range   WBC MORPHOLOGY ABSOLUTE LYMPHOCYTOSIS    RBC MORPHOLOGY POLYCHROMASIA PRESENT     Comment: OVALOCYTES   Plt Morphology PLATELETS APPEAR DECREASED    Clinical Information pancytopenia     Comment: Performed at [facility], [place] [address], [place], [place] [address]  I-stat chem 8, ED (not at [facility], [facility] or [facility])     Status: Abnormal    Collection Time: 11/15/22  9:06 PM  Result Value Ref Range   Sodium 138 135 -  145 mmol/L   Potassium 3.8 3.5 - 5.1 mmol/L   Chloride 99 98 - 111 mmol/L   BUN 14 6 - 20 mg/dL   Creatinine, Ser 0.50 (L) 0.61 - 1.24 mg/dL   Glucose, Bld 79 70 - 99 mg/dL    Comment: Glucose reference range applies only to samples taken after fasting for at least 8 hours.   Calcium, Ion 1.21 1.15 - 1.40 mmol/L   TCO2 30 22 - 32 mmol/L   Hemoglobin 9.9 (L) 13.0 - 17.0 g/dL   HCT 29.0 (L) 39.0 - 52.0 %  Vitamin B12     Status: None   Collection Time: 11/15/22 10:35 PM  Result Value Ref Range   Vitamin B-12 184 180 - 914 pg/mL    Comment: (NOTE) This assay is not validated for testing neonatal or myeloproliferative syndrome specimens for Vitamin B12 levels. Performed at [facility], [place] [address], [place], [place] [address]   Folate     Status: None   Collection Time: 11/15/22 10:35 PM  Result Value Ref Range   Folate 14.1 >5.9 ng/mL    Comment: Performed at [facility], [place] [address] [place], [place] [address]  Valproic acid level     Status: Abnormal   Collection Time: 11/15/22 11:35 PM  Result Value Ref Range   Valproic Acid Lvl 41 (L) 50.0 - 100.0 ug/mL    Comment: Performed at [facility], [place] [address], [place],  [address]  DIC Panel ONCE - STAT     Status: Abnormal   Collection Time: 11/16/22  3:20 AM  Result Value Ref Range   Prothrombin Time 15.9 (H) 11.4 - 15.2 seconds   INR 1.3 (H) 0.8 - 1.2    Comment: (NOTE) INR goal varies based on device and disease states.    aPTT 30 24 - 36 seconds   Fibrinogen 380 210 - 475 mg/dL    Comment: (NOTE) Fibrinogen results may be underestimated in patients receiving thrombolytic therapy.    D-Dimer, Quant 0.85 (H) 0.00 - 0.50 ug/mL-FEU    Comment: (NOTE) At the manufacturer cut-off value of 0.5 g/mL FEU, this assay has a negative predictive value of 95-100%.This assay is  intended for use in conjunction with a clinical pretest probability (PTP) assessment model to exclude pulmonary embolism (PE) and deep venous thrombosis (DVT) in outpatients suspected of PE or DVT. Results should be correlated with clinical presentation.    Platelets 16 (LL) 150 - 400 K/uL    Comment: Immature Platelet Fraction may be clinically indicated, consider ordering this additional test [id number] CRITICAL VALUE NOTED.  VALUE IS CONSISTENT WITH PREVIOUSLY REPORTED AND CALLED VALUE. REPEATED TO VERIFY    Smear Review GIANT PLATELETS SEEN     Comment: Performed at [place] [address], [place],  [address]    Current Facility-Administered Medications  Medication Dose Route Frequency Provider Last Rate Last Admin   albuterol (PROVENTIL) (2.5 MG/3ML) 0.083% nebulizer solution 3 mL  3 mL Inhalation Q6H PRN Etta Quill, DO       clonazePAM Bobbye [place]) tablet 0.5 mg  0.5 mg Oral BID PRN Etta Quill, DO       iohexol (OMNIPAQUE) 9 MG/ML oral solution 500 mL  500 mL Oral Q1H Gorsuch, Ni, MD   500 mL at 11/16/22 0832   iohexol (OMNIPAQUE) 9 MG/ML oral solution            lidocaine (XYLOCAINE) 1 % (with  pres) injection            LORazepam (ATIVAN) tablet 0.5 mg  0.5 mg Oral BID Jennette Kettle M, DO   0.5 mg at 11/16/22 [id number]   multivitamin with minerals tablet 1 tablet  1 tablet Oral Daily Jennette Kettle M, DO       tamsulosin (FLOMAX) capsule 0.4 mg  0.4 mg Oral Daily Etta Quill, DO        Musculoskeletal: Strength & Muscle Tone: {desc; muscle tone:32375} Gait & Station: {PE GAIT ED [id number] Patient leans: {Patient Leans:[id number]}            Psychiatric Specialty Exam:  Presentation  General Appearance:  Casual  Eye Contact: Fair  Speech: Clear and Coherent  Speech Volume: Normal  Handedness:No data recorded  Mood and Affect  Mood: Euthymic  Affect: Congruent   Thought Process  Thought  Processes: Coherent  Descriptions of Associations:Circumstantial  Orientation:Full (Time, Place and Person)  Thought Content:WDL  History of Schizophrenia/Schizoaffective disorder:No data recorded Duration of Psychotic Symptoms:No data recorded Hallucinations:No data recorded Ideas of Reference:None  Suicidal Thoughts:No data recorded Homicidal Thoughts:No data recorded  Sensorium  Memory: Immediate Fair  Judgment: Intact  Insight: Present   Executive Functions  Concentration: Fair  Attention Span: Fair  Recall: [employer] of Knowledge: Fair  Language: Fair   Psychomotor Activity  Psychomotor Activity:No data recorded  Assets  Assets: Housing; Leisure Time; Social Support   Sleep  Sleep:No data recorded  Physical Exam: Physical Exam ROS Blood pressure 108/72, pulse 81, temperature 98.7 F (37.1 C), temperature source Oral, resp. rate 19, height '5\' 8"'$  (1.727 m), SpO2 95 %. Body mass index is 18.84 kg/m.  Treatment Plan Summary: {CHL [facility] MD [place] [id number]  Disposition: {CHL [facility] Consult [id number]  Suella Broad, FNP 11/16/2022 8:57 AM

## 2022-11-16 NOTE — Progress Notes (Addendum)
Same-day PROGRESS NOTE   Franklin Ayers  [id number] DOB: Oct 16, 1960 DOA: 11/15/2022 PCP: Pcp, No   Date of Service: the patient was seen and examined on 11/16/2022  Brief Narrative:  62 year old male with PMHx significant of Schizophrenia / schizoaffective disorder.  He resides in SNF at baseline.  Patient presented to [facility] emergency department due to pancytopenia and an enlarging mass on the left side of the face.  Upon evaluation in the emergency department significant pancytopenia was confirmed.  Case was discussed with [name] such with hematology/oncology.  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  The hospitalist group was called to assess the patient for admission the hospital.  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  IR consultation was obtained.    Assessment and Plan: * Pancytopenia ([place]) Broad differential however most likely caused by underlying malignancy concerned that patient may have pancytopenia in setting of antipsychotic use.   Patient undergo bone marrow biopsy per [name] Per my discussions with [name] patient is being placed on a regimen of Zyprexa, Ativan and clonazepam for now while holding Depakote, mirtazapine and quetiapine. Patient undergoing 1 pack platelet transfusion today in anticipation for parotid biopsy and bone marrow biopsy tomorrow with interventional radiology.   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 after midnight  [name] with oncology following, her input is appreciated  Schizoaffective disorder ([place]) Symptoms remain relatively well-controlled despite  holding medications overnight  Tentative plan for resumption of psychiatric medications as noted above   Benign prostatic hyperplasia Continuing home regimen of tamsulosin    Subjective:  Patient complains of moderate pain coming from the left face, tight in quality, nonradiating, worse with movement.  Patient denies any shortness of breath, wheezing or drooling.  Physical Exam:  Vitals:   11/16/22 1413 11/16/22 1444 11/16/22 1651 11/16/22 2246  BP: 121/76 118/70 128/74 (!) 99/57  Pulse: 89 94 98 91  Resp: '18 18 18 18  '$ Temp: 99.3 F (37.4 C) 99.1 F (37.3 C) 98.6 F (37 C) 98.6 F (37 C)  TempSrc: Oral Oral Oral   SpO2: 96% 96% 96% 97%  Height:        Constitutional: Awake alert and oriented x3, no associated distress.   Skin: no rashes, no lesions, good skin turgor noted. Eyes: Pupils are equally reactive to light.  Significant conjunctival pallor noted.   ENMT: Dry mucous membranes noted.  Extend left facial swelling at the angle of the jaw, likely emanating from parotid gland.  Palpable mass is firm, minimally tender and fixed.   Respiratory: clear to auscultation bilaterally, no wheezing, no crackles. Normal respiratory effort. No accessory muscle use.  Cardiovascular: Regular rate and rhythm, no murmurs / rubs / gallops. No extremity edema. 2+ pedal pulses. No carotid bruits.  Abdomen: Abdomen is soft and nontender.  No evidence of intra-abdominal masses.  Positive bowel sounds noted in all quadrants.   Musculoskeletal: No joint deformity upper and lower extremities. Good ROM, no contractures. Normal muscle tone.    Data Reviewed:  I have personally reviewed and interpreted labs, imaging.  Significant findings are   CBC: Recent Labs  Lab 11/15/22 2100 11/15/22 2106  11/16/22 0320  WBC 1.8*  --   --   NEUTROABS 0.1*  --   --   HGB 9.1* 9.9*  --   HCT 29.4* 29.0*  --   MCV 88.6  --   --   PLT 15*  --  16*   Basic Metabolic Panel: Recent Labs  Lab  11/15/22 2100 11/15/22 2106  NA 136 138  K 3.7 3.8  CL 102 99  CO2 28  --   GLUCOSE 80 79  BUN 15 14  CREATININE 0.56* 0.50*  CALCIUM 8.6*  --    GFR: CrCl cannot be calculated (Unknown ideal weight.). Liver Function Tests: Recent Labs  Lab 11/15/22 2100  AST 12*  ALT 9  ALKPHOS 62  BILITOT 0.6  PROT 7.1  ALBUMIN 3.2*    Coagulation Profile: Recent Labs  Lab 11/16/22 0320  INR 1.3*      Code Status:  Full code.      Severity of Illness:  The appropriate patient status for this patient is INPATIENT. Inpatient status is judged to be reasonable and necessary in order to provide the required intensity of service to ensure the patient's safety. The patient's presenting symptoms, physical exam findings, and initial radiographic and laboratory data in the context of their chronic comorbidities is felt to place them at high risk for further clinical deterioration. Furthermore, it is not anticipated that the patient will be medically stable for discharge from the hospital within 2 midnights of admission.   * I certify that at the point of admission it is my clinical judgment that the patient will require inpatient hospital care spanning beyond 2 midnights from the point of admission due to high intensity of service, high risk for further deterioration and high frequency of surveillance required.*   Author:  Vernelle Emerald MD  11/16/2022 11:33 PM

## 2022-11-16 NOTE — Hospital Course (Addendum)
62 year old male with PMHx significant of Schizophrenia / schizoaffective disorder.  He resides in SNF at baseline.  Patient presented to [facility] emergency department due to pancytopenia and an enlarging mass on the left side of the face.  Upon evaluation in the emergency department significant pancytopenia was confirmed.  Case was discussed with [name] such with hematology/oncology.  The hospitalist group was called to assess the patient for admission the hospital.  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  Several of the patient's antipsychotic medications were therefore held with ongoing discussions with both [name] with the psychiatric service as well as Dr [name] with hematology oncology.  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  IR consultation was obtained and under the direction of oncology both a bone marrow biopsy of the left facial mass as well as a bone marrow biopsy were performed successfully on 3/7.  In preparation for this procedure patient received a transfusion of 2 packs of platelets.  Patient's pancytopenia remained stable throughout the hospitalization with no episodes of bleeding.  Further workup did reveal that the patient also had a low normal vitamin B12 and therefore patient was given an intramuscular injection of vitamin B12 during this hospitalization followed by 1000 mcg daily of oral vitamin B12 at time of discharge.  Concerning patient's psychiatric regimen, patient was transitioned to Zyprexa monotherapy alongside Ativan twice daily.  At time of discharge both psychiatry and hematology recommended continuing to hold Depakote mirtazapine and quetiapine while patient was undergoing  workup.  Consideration of resuming these medications can be undertaken upon outpatient follow-up with his psychiatrist after more workup of the patient's suspected malignancy is complete.  Patient was discharged back to his group home in improved and stable condition on 11/18/2022.  Arranges were made for the patient to follow-up closely as an outpatient with Dr. [name] with oncology.  Patient was also advised to follow-up with his outpatient psychiatrist in 1 to 2 weeks for further evaluation.  UPDATE:  I have been contacted by [name] who has just received the preliminary bone marrow biopsy results.  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  This would certainly carry a guarded prognosis especially considering the fact the patient is likely suffering from a concurrent head and neck cancer with a parotid mass.  I spoke to the patient and informed him of these preliminary findings prior to him being discharged in the building.  I additionally spoke to his sister with his permission via phone conversation and updated her as well.  [name] informed that she will contact the patient again on Monday with a complete biopsy results.  She also has strongly encouraged the patient to follow-up as scheduled this coming Thursday with her.  If the patient develops any complications in the meantime such as bleeding or fever he may be best served at a tertiary care center such as [facility] health.

## 2022-11-16 NOTE — Assessment & Plan Note (Addendum)
Multiple possible etiologies including being medication induced secondary psychiatric regimen or possibly secondary to underlying malignancy.   Per my discussions with Dr. Gasper Sells with psychiatry on 3/6 patient is being placed on a regimen of Zyprexa, Ativan and clonazepam for now while holding Depakote, mirtazapine and quetiapine. With the assistance of Dr. Bertis Ruddy with heme-onc, patient has underwent parotid and bone marrow biopsies today.  Patient has received 2 separate platelet transfusions of 1 pack each. Monitoring cell lines with serial CBCs No clinical evidence of bleeding at this time. Will discharge once pancytopenia is confirmed to be stable.  Patient will require close outpatient follow-up with both psychiatry and hematology.

## 2022-11-16 NOTE — H&P (Signed)
History and Physical    Patient: Franklin Ayers [id number] DOB: [birth year] DOA: 11/15/2022 DOS: the patient was seen and examined on 11/16/2022 PCP: Pcp, No  Patient coming from: SNF  Chief Complaint: Leukopenia  HPI: Franklin Ayers is a 62 y.o. male with medical history significant of Schizophrenia / schizoaffective disorder.  He resides in SNF at baseline.  Pt sent in to ED from his facility today due to: 1) low WBC 2) progressively enlarging cyst / mass on L side of face  Initially presented to ED in Jan, had blood work drawn and US done, but then left AMA.  He tells EDP that mass hasn't really changed since then but thinks it has gotten a little bit bigger.  Denies fevers.  No unintentional wt loss nor night sweats.  No difficulty breathing or swallowing.   Review of Systems: As mentioned in the history of present illness. All other systems reviewed and are negative. 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 His smoking use included cigarettes. He smoked an average of 2 packs per day. He has never used smokeless tobacco. He reports that he does not drink alcohol and does not use drugs.  Allergies  Allergen Reactions   Penicillins Shortness Of Breath    .Has patient had a PCN reaction causing immediate rash, facial/tongue/throat swelling, SOB or lightheadedness with hypotension: Yes Has patient had a PCN reaction causing severe rash involving mucus membranes or skin necrosis: yes Has patient had a PCN reaction that required hospitalization: no Has patient had a PCN reaction occurring within the last 10 years: no If all of the above answers are "NO",  then may proceed with Cephalosporin use.     Family History  Problem Relation Age of Onset   Cancer Other    Stroke Other     Prior to Admission medications   Medication Sig Start Date End Date Taking? Authorizing Provider  albuterol (VENTOLIN HFA) 108 (90 Base) MCG/ACT inhaler Inhale 2 puffs into the lungs every 6 (six) hours as needed for wheezing or shortness of breath. 09/15/22  Yes [provider]  cholecalciferol (VITAMIN D) 25 MCG (1000 UNIT) tablet Take 1,000 Units by mouth daily.   Yes [provider]  clonazePAM (KLONOPIN) 0.5 MG tablet Take 0.5 mg by mouth 2 (two) times daily as needed for anxiety. 11/14/22  Yes [provider]  divalproex (DEPAKOTE) 500 MG DR tablet Take 500-1,000 mg by mouth 2 (two) times daily. Take 1 tablet (500 mg) by mouth daily and Take 2 tablets (1000 mg) at bedtime 11/10/22  Yes [provider]  LORazepam (ATIVAN) 0.5 MG tablet Take 1 tablet (0.5 mg total) by mouth every 8 (eight) hours. Patient taking differently: Take 0.5 mg by mouth 2 (two) times daily. 12/06/17  Yes Gerlene Fee, NP  mirtazapine (REMERON) 30 MG tablet Take 30 mg by mouth at bedtime.   Yes [provider]  Multiple Vitamin (MULTIVITAMIN WITH MINERALS) TABS tablet Take 1 tablet by mouth daily.   Yes [provider]  OLANZapine zydis (ZYPREXA) 20 MG disintegrating tablet Take 20 mg  by mouth daily. 11/10/22  Yes [provider]  QUEtiapine (SEROQUEL) 400 MG tablet Take 400 mg by mouth at bedtime.    Yes [provider]  sennosides-docusate sodium (SENOKOT-S) 8.6-50 MG tablet Take 1 tablet by mouth in the morning and at bedtime.   Yes [provider]  tamsulosin (FLOMAX) 0.4 MG CAPS capsule Take 2 capsules (0.8 mg total) by mouth daily. Patient taking differently: Take 0.4 mg by mouth daily. 11/06/17  Yes Bonnita Hollow, MD  divalproex (DEPAKOTE ER) 500 MG 24 hr tablet Take 1 tablet (500 mg total) by mouth  daily with breakfast. Patient not taking: Reported on 11/15/2022 11/06/17   Bonnita Hollow, MD  meclizine (ANTIVERT) 25 MG tablet Take 1 tablet (25 mg total) by mouth 3 (three) times daily as needed for dizziness. Patient not taking: Reported on 11/15/2022 06/05/18   Jacqlyn Larsen, PA-C  mupirocin cream (BACTROBAN) 2 % Apply topically daily. Apply until resolution of peri-naval wound Patient not taking: Reported on 12/23/2019 11/06/17   Bonnita Hollow, MD  nystatin cream (MYCOSTATIN) Apply 1 application topically 2 (two) times daily. Apply to scrotum and penis until cleared Patient not taking: Reported on 11/15/2022 12/23/19   Lennice Sites, DO  traMADol (ULTRAM) 50 MG tablet Take 1 tablet (50 mg total) by mouth every 6 (six) hours as needed. Patient not taking: Reported on 12/23/2019 10/21/18   Domenic Moras, PA-C    Physical Exam: Vitals:   11/15/22 2047 11/15/22 2048 11/15/22 2050 11/16/22 0037  BP: 103/63   117/81  Pulse:  81 81 80  Resp:   18 19  Temp:  97.9 F (36.6 C)  98.2 F (36.8 C)  TempSrc:  Oral  Oral  SpO2:  96% 95% 97%  Height:       Constitutional: NAD, calm, comfortable ENMT: Large mass to L side of face at expected location of parotid gland. Respiratory: clear to auscultation bilaterally, no wheezing, no crackles. Normal respiratory effort. No accessory muscle use.  Cardiovascular: Regular rate and rhythm, no murmurs / rubs / gallops. No extremity edema. 2+ pedal pulses. No carotid bruits.  Abdomen: no tenderness, no masses palpated. No hepatosplenomegaly. Bowel sounds positive.  Neurologic: CN 2-12 grossly intact. Sensation intact, DTR normal. Strength 5/5 in all 4.  Psychiatric: Calm, cooperative, Alert and oriented x 3.  Data Reviewed:    CT neck soft tissue: IMPRESSION: 1. 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 Tissue sampling is recommended. 2. Prominent left level 2A lymph  node, which retains normal morphology. 3. Periapical lucency about the remaining maxillary and mandibular teeth, concerning for apical periodontitis. Correlate with dental exam.     Latest Ref Rng & Units 11/15/2022    9:06 PM 11/15/2022    9:00 PM 09/26/2022    1:42 PM  CBC  WBC 4.0 - 10.5 K/uL  1.8  2.2   Hemoglobin 13.0 - 17.0 g/dL 9.9  9.1  11.7   Hematocrit 39.0 - 52.0 % 29.0  29.4  37.3   Platelets 150 - 400 K/uL  15  42       Latest Ref Rng & Units 11/15/2022    9:06 PM 11/15/2022    9:00 PM 09/26/2022    1:42 PM  CMP  Glucose 70 - 99 mg/dL 79  80  91   BUN 6 - 20 mg/dL 14  15  14  Creatinine 0.61 - 1.24 mg/dL 0.50  0.56  0.54   Sodium 135 - 145 mmol/L 138  136  134   Potassium 3.5 - 5.1 mmol/L 3.8  3.7  3.9   Chloride 98 - 111 mmol/L 99  102  100   CO2 22 - 32 mmol/L  28  25   Calcium 8.9 - 10.3 mg/dL  8.6  9.3   Total Protein 6.5 - 8.1 g/dL  7.1    Total Bilirubin 0.3 - 1.2 mg/dL  0.6    Alkaline Phos 38 - 126 U/L  62    AST 15 - 41 U/L  12    ALT 0 - 44 U/L  9       Assessment and Plan: * Pancytopenia ([facility]) Concerned that patient may have pancytopenia in setting of antipsychotic use. Specifically the differential on the CBC looks very suspicious for agranulocytosis which is a well known risk / adverse reaction of the zyprexa. And the thrombocytopenia is a well known risk / adverse reaction from the depakote. No new neuro findings, renal fx at baseline.  TTP felt less likely. Will check DIC panel, but again, this felt somewhat less likely. Holding both antipsychotics at this time Daily CBC EDP spoke with [name], presumably needs heme/onc eval in AM to make sure we aren't missing some other cause of his severe pancytopenia. DIC panel ordered  Tumor of parotid gland Pt with enlarging tumor of L parotid gland, worrisome for neoplasm. Likely needs biopsy for tissue diagnosis Have put in for IR eval for possible biopsy in AM NPO after MN  Schizoaffective  disorder [facility]) Psych consult put into Epic as pt will need coverage with an alternative regimen of some sort while we hold antipsychotic medications due to profound pancytopenia.  Although not psychotic at this point (pt sleeping comfortably), I'm concerned about when these meds wear off.     Advance Care Planning:   Code Status: Full Code Full code per MOST form (see ACP section of chart).  Consults: Message sent to [name] (think that EDP may have also called), also put in for IR consult via [place].  Family Communication: No family in room  Severity of Illness: The appropriate patient status for this patient is OBSERVATION. Observation status is judged to be reasonable and necessary in order to provide the required intensity of service to ensure the patient's safety. The patient's presenting symptoms, physical exam findings, and initial radiographic and laboratory data in the context of their medical condition is felt to place them at decreased risk for further clinical deterioration. Furthermore, it is anticipated that the patient will be medically stable for discharge from the hospital within 2 midnights of admission.   Author: Etta Quill., DO 11/16/2022 2:33 AM  For on call review [email or web address].

## 2022-11-16 NOTE — Assessment & Plan Note (Addendum)
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 after midnight  [name] with oncology following, her input is appreciated

## 2022-11-16 NOTE — Consult Note (Signed)
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 Biopsy is recommended but due to his neck thickening severe thrombocytopenia, this is placed on hold His chest exam is grossly abnormal With pancytopenia, I am concerned about possible metastatic disease I recommend CT imaging of the chest, abdomen and pelvis for evaluation and he is in agreement I discussed my preliminary findings with interventional radiology team Biopsy will be deferred until at least tomorrow pending further investigations  Worsening pancytopenia His antipsychotic medications can cause severe pancytopenia but in this situation, malignancy including bone marrow infiltration cannot be ruled out I will order CT imaging for evaluation We will If that is unrevealing, he will need bone marrow aspirate and biopsy In anticipation for surgery, I recommend platelet transfusions today  We discussed some of the risks, benefits, and alternatives of platelets transfusions. The patient is symptomatic from low platelet counts with bruising/bleeding/at high risk of life-threatening bleeding and the platelet count is critically low.  Some of the side-effects to be expected including risks of transfusion reactions, chills, infection, syndrome of volume overload and risk of hospitalization from various reasons and the patient is willing to proceed and went ahead to sign consent today.  Borderline vitamin B12 deficiency B12 level is borderline low Will add vitamin B12 replacement therapy  Neutropenia In the absence of fever, he does not need G-CSF support for neutropenia However, if he develops fever, he will need to be investigated with cultures and started on broad-spectrum IV antibiotics  Recurrent epistaxis Could be related to thrombocytopenia Will observe closely I do not appreciate any abnormalities in the  sinus cavity on his CT imaging  Smoker Bilateral lung crackles Will order CT imaging to assess  Discharge planning He is not safe for discharge due to severe pancytopenia He would likely be here for several days  The total time spent in the appointment was 80 minutes encounter with patients including review of chart and various tests results, discussions about plan of care and coordination of care plan   All questions were answered. The patient knows to call the clinic with any problems, questions or concerns. No barriers to learning was detected.  Heath Lark, MD 3/6/20247:22 AM  CHIEF COMPLAINTS/PURPOSE OF CONSULTATION:  Pancytopenia, large left parotid mass  HISTORY OF PRESENTING ILLNESS:  Franklin Ayers 62 y.o. male is admitted to the hospital due to enlarging left parotid mass and severe pancytopenia The patient lives in a group home He has significant psychiatric history He has been a smoker for over 30 years He has noted enlarging left sided parotid mass for many months He was sent to the emergency department approximately a month ago and subsequently discharged He was sent back to the emergency department yesterday due to worsening pancytopenia and enlarging left sided parotid mass Blood work confirms severe pancytopenia CT imaging showed large mass suspicious for malignancy He is being admitted for further investigations He has noted recurrent epistaxis on a regular basis for several months Denies hematuria or hematochezia Denies difficulties with swallowing or voice changes No recent weight loss Denies alcohol intake  MEDICAL HISTORY:  Past Medical History:  Diagnosis Date   Anemia    BPH (benign prostatic hyperplasia)    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[place])    Cystitis    Dysphagia    Hypokalemia    Manic depression ([place])    Mental disorder  Schizoaffective disorder ([place])     SURGICAL HISTORY: Past Surgical History:  Procedure Laterality Date    TONSILLECTOMY      SOCIAL HISTORY: Social History   Socioeconomic History   Marital status: Single    Spouse name: Not on file   Number of children: Not on file   Years of education: Not on file   Highest education level: Not on file  Occupational History   Not on file  Tobacco Use   Smoking status: Former    Packs/day: 2.00    Types: Cigarettes   Smokeless tobacco: Never   Tobacco comments:    refused  Vaping Use   Vaping Use: Never used  Substance and Sexual Activity   Alcohol use: No    Comment: occasional   Drug use: No   Sexual activity: Never  Other Topics Concern   Not on file  Social History Narrative   Not on file   Social Determinants of Health   Financial Resource Strain: Not on file  Food Insecurity: Not on file  Transportation Needs: Not on file  Physical Activity: Not on file  Stress: Not on file  Social Connections: Not on file  Intimate Partner Violence: Not on file    FAMILY HISTORY: Family History  Problem Relation Age of Onset   Cancer Other    Stroke Other     ALLERGIES:  is allergic to penicillins.  MEDICATIONS:  Current Facility-Administered Medications  Medication Dose Route Frequency Provider Last Rate Last Admin   albuterol (PROVENTIL) (2.5 MG/3ML) 0.083% nebulizer solution 3 mL  3 mL Inhalation Q6H PRN Etta Quill, DO       clonazePAM Bobbye [place]) tablet 0.5 mg  0.5 mg Oral BID PRN Etta Quill, DO       LORazepam (ATIVAN) tablet 0.5 mg  0.5 mg Oral BID Jennette Kettle M, DO   0.5 mg at 11/16/22 [id number]   multivitamin with minerals tablet 1 tablet  1 tablet Oral Daily Alcario Drought, Jared M, DO       tamsulosin (FLOMAX) capsule 0.4 mg  0.4 mg Oral Daily Jennette Kettle M, DO        REVIEW OF SYSTEMS:   Constitutional: Denies fevers, chills or abnormal night sweats Eyes: Denies blurriness of vision, double vision or watery eyes Ears, nose, mouth, throat, and face: Denies mucositis or sore throat Respiratory: Denies cough, dyspnea  or wheezes Cardiovascular: Denies palpitation, chest discomfort or lower extremity swelling Gastrointestinal:  Denies nausea, heartburn or change in bowel habits Skin: Denies abnormal skin rashes Lymphatics: Denies new lymphadenopathy or easy bruising Neurological:Denies numbness, tingling or new weaknesses Behavioral/Psych: Mood is stable, no new changes  All other systems were reviewed with the patient and are negative.  PHYSICAL EXAMINATION: ECOG PERFORMANCE STATUS: 1 - Symptomatic but completely ambulatory  Vitals:   11/16/22 0037 11/16/22 0420  BP: 117/81 108/72  Pulse: 80 81  Resp: 19 19  Temp: 98.2 F (36.8 C) 98.7 F (37.1 C)  SpO2: 97% 95%   There were no vitals filed for this visit.  GENERAL:alert, no distress and comfortable SKIN: skin color, texture, turgor are normal, no rashes or significant lesions EYES: normal, conjunctiva are pink and non-injected, sclera clear OROPHARYNX:no exudate, no erythema and lips, buccal mucosa, and tongue normal.  Noted very poor dentition NECK: Very large parotid mass on the left side of his neck  LYMPH:  no palpable lymphadenopathy in the cervical, axillary or inguinal LUNGS: Bilateral crackles are noted  HEART: regular  rate & rhythm and no murmurs and no lower extremity edema ABDOMEN:abdomen soft, non-tender and normal bowel sounds Musculoskeletal:no cyanosis of digits and no clubbing  PSYCH: alert & oriented x 3 with fluent speech NEURO: no focal motor/sensory deficits  LABORATORY DATA:  I have reviewed the data as listed Lab Results  Component Value Date   WBC 1.8 (L) 11/15/2022   HGB 9.9 (L) 11/15/2022   HCT 29.0 (L) 11/15/2022   MCV 88.6 11/15/2022   PLT 16 (LL) 11/16/2022   Recent Labs    09/26/22 1342 11/15/22 2100 11/15/22 2106  NA 134* 136 138  K 3.9 3.7 3.8  CL 100 102 99  CO2 25 28  --   GLUCOSE 91 80 79  BUN '14 15 14  '$ CREATININE 0.54* 0.56* 0.50*  CALCIUM 9.3 8.6*  --   GFRNONAA >60 >60  --   PROT   --  7.1  --   ALBUMIN  --  3.2*  --   AST  --  12*  --   ALT  --  9  --   ALKPHOS  --  62  --   BILITOT  --  0.6  --     RADIOGRAPHIC STUDIES: I have personally reviewed the radiological images as listed and agreed with the findings in the report. CT Soft Tissue Neck W Contrast  Result Date: 11/15/2022 CLINICAL DATA:  Palpable mass in the left neck EXAM: CT NECK WITH CONTRAST TECHNIQUE: Multidetector CT imaging of the neck was performed using the standard protocol following the bolus administration of intravenous contrast. RADIATION DOSE REDUCTION: This exam was performed according to the departmental dose-optimization program which includes automated exposure control, adjustment of the mA and/or kV according to patient size and/or use of iterative reconstruction technique. CONTRAST:  34m OMNIPAQUE IOHEXOL 300 MG/ML  SOLN COMPARISON:  No prior CT neck available, correlation is made with ultrasound 09/26/2022 and CT cervical spine 10/14/2017 FINDINGS: Pharynx and larynx: Normal. No mass or swelling. Salivary glands: Large cystic and solid mass in the left parotid gland, as seen on the recent ultrasound, which measures up to 4.2 x 4.8 x 5.5 cm (AP x TR x CC) (series 7, image 93 and series 6, image 75) in retrospect, this may have been present on the 10/14/2017 CT, where it measured 1.5 x 3.0 x 2.8 cm (AP x TR x CC), but was primarily solid at that time. The right parotid gland and bilateral submandibular glands are unremarkable. Thyroid: Normal. Lymph nodes: Prominent left level 2A lymph node measures up to 1 cm in short axis (series 2, image 45), although this retains normal morphology. No abnormal density lymph nodes. Vascular: Patent. Atherosclerotic calcifications at the bifurcations. Limited intracranial: Negative. Visualized orbits: Negative. Mastoids and visualized paranasal sinuses: Mucosal thickening in the ethmoid air cells. The mastoids are well aerated. Skeleton: No acute osseous abnormality.  Periapical lucency about the remaining maxillary and mandibular teeth. Upper chest: No focal pulmonary opacity or pleural effusion. Emphysema. Other: None. IMPRESSION: 1. 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 Tissue sampling is recommended. 2. Prominent left level 2A lymph node, which retains normal morphology. 3. Periapical lucency about the remaining maxillary and mandibular teeth, concerning for apical periodontitis. Correlate with dental exam. Electronically Signed   By: AMerilyn BabaM.D.   On: 11/15/2022 21:59

## 2022-11-16 NOTE — Assessment & Plan Note (Addendum)
Symptoms remain relatively well-controlled despite holding several of the occasions since admission Partial Sumption of psychiatric regimen as noted above

## 2022-11-16 NOTE — Assessment & Plan Note (Signed)
Continuing home regimen of tamsulosin

## 2022-11-17 ENCOUNTER — Inpatient Hospital Stay (HOSPITAL_COMMUNITY): Payer: Medicare Other

## 2022-11-17 DIAGNOSIS — F259 Schizoaffective disorder, unspecified: Secondary | ICD-10-CM | POA: Diagnosis not present

## 2022-11-17 DIAGNOSIS — N400 Enlarged prostate without lower urinary tract symptoms: Secondary | ICD-10-CM

## 2022-11-17 DIAGNOSIS — D61818 Other pancytopenia: Secondary | ICD-10-CM | POA: Diagnosis not present

## 2022-11-17 DIAGNOSIS — F17210 Nicotine dependence, cigarettes, uncomplicated: Secondary | ICD-10-CM

## 2022-11-17 DIAGNOSIS — D490 Neoplasm of unspecified behavior of digestive system: Secondary | ICD-10-CM | POA: Diagnosis not present

## 2022-11-17 LAB — COMPREHENSIVE METABOLIC PANEL
ALT: 8 U/L (ref 0–44)
AST: 12 U/L — ABNORMAL LOW (ref 15–41)
Albumin: 3.2 g/dL — ABNORMAL LOW (ref 3.5–5.0)
Alkaline Phosphatase: 60 U/L (ref 38–126)
Anion gap: 7 (ref 5–15)
BUN: 13 mg/dL (ref 6–20)
CO2: 26 mmol/L (ref 22–32)
Calcium: 8.6 mg/dL — ABNORMAL LOW (ref 8.9–10.3)
Chloride: 101 mmol/L (ref 98–111)
Creatinine, Ser: 0.61 mg/dL (ref 0.61–1.24)
GFR, Estimated: >60 mL/min (ref >60)
Glucose, Bld: 93 mg/dL (ref 70–99)
Potassium: 3.7 mmol/L (ref 3.5–5.1)
Sodium: 134 mmol/L — ABNORMAL LOW (ref 135–145)
Total Bilirubin: 0.8 mg/dL (ref 0.3–1.2)
Total Protein: 7.3 g/dL (ref 6.5–8.1)

## 2022-11-17 LAB — CBC WITH DIFFERENTIAL/PLATELET
Abs Immature Granulocytes: 0.1 10*3/uL — ABNORMAL HIGH (ref 0.00–0.07)
Basophils Absolute: 0 10*3/uL (ref 0.0–0.1)
Basophils Relative: 1 %
Eosinophils Absolute: 0 10*3/uL (ref 0.0–0.5)
Eosinophils Relative: 1 %
HCT: 30.9 % — ABNORMAL LOW (ref 39.0–52.0)
Hemoglobin: 9.6 g/dL — ABNORMAL LOW (ref 13.0–17.0)
Immature Granulocytes: 6 %
Lymphocytes Relative: 56 %
Lymphs Abs: 1 10*3/uL (ref 0.7–4.0)
MCH: 27.7 pg (ref 26.0–34.0)
MCHC: 31.1 g/dL (ref 30.0–36.0)
MCV: 89 fL (ref 80.0–100.0)
Monocytes Absolute: 0.1 10*3/uL (ref 0.1–1.0)
Monocytes Relative: 7 %
Neutro Abs: 0.5 10*3/uL — ABNORMAL LOW (ref 1.7–7.7)
Neutrophils Relative %: 29 %
Platelets: 29 10*3/uL — CL (ref 150–400)
RBC: 3.47 MIL/uL — ABNORMAL LOW (ref 4.22–5.81)
RDW: 18.6 % — ABNORMAL HIGH (ref 11.5–15.5)
WBC: 1.7 10*3/uL — ABNORMAL LOW (ref 4.0–10.5)
nRBC: 4.1 % — ABNORMAL HIGH (ref 0.0–0.2)

## 2022-11-17 LAB — BPAM PLATELET PHERESIS
Blood Product Expiration Date: 202403082359
ISSUE DATE / TIME: 202403061422
Unit Type and Rh: 5100

## 2022-11-17 LAB — URINALYSIS, COMPLETE (UACMP) WITH MICROSCOPIC
Bacteria, UA: NONE SEEN
Bilirubin Urine: NEGATIVE
Glucose, UA: NEGATIVE mg/dL
Ketones, ur: NEGATIVE mg/dL
Nitrite: NEGATIVE
Protein, ur: NEGATIVE mg/dL
Specific Gravity, Urine: 1.011 (ref 1.005–1.030)
pH: 7 (ref 5.0–8.0)

## 2022-11-17 LAB — MAGNESIUM: Magnesium: 1.9 mg/dL (ref 1.7–2.4)

## 2022-11-17 LAB — PREPARE PLATELET PHERESIS: Unit division: 0

## 2022-11-17 MED ORDER — FENTANYL CITRATE (PF) 100 MCG/2ML IJ SOLN
INTRAMUSCULAR | Status: AC | PRN
  Administered 2022-11-17 (×2): 50 ug via INTRAVENOUS

## 2022-11-17 MED ORDER — LIDOCAINE HCL (PF) 1 % IJ SOLN
INTRAMUSCULAR | Status: AC | PRN
  Administered 2022-11-17: 5 mL

## 2022-11-17 MED ORDER — FLUMAZENIL 0.5 MG/5ML IV SOLN
INTRAVENOUS | Status: AC
  Filled 2022-11-17: qty 5

## 2022-11-17 MED ORDER — ACETAMINOPHEN 325 MG PO TABS
650.0000 mg | ORAL_TABLET | Freq: Once | ORAL | Status: AC
  Administered 2022-11-17: 650 mg via ORAL
  Filled 2022-11-17: qty 2

## 2022-11-17 MED ORDER — SULFAMETHOXAZOLE-TRIMETHOPRIM 800-160 MG PO TABS
1.0000 | ORAL_TABLET | Freq: Two times a day (BID) | ORAL | Status: DC
  Administered 2022-11-18 (×2): 1 via ORAL
  Filled 2022-11-17 (×2): qty 1

## 2022-11-17 MED ORDER — NALOXONE HCL 0.4 MG/ML IJ SOLN
INTRAMUSCULAR | Status: AC
  Filled 2022-11-17: qty 1

## 2022-11-17 MED ORDER — MIDAZOLAM HCL 2 MG/2ML IJ SOLN
INTRAMUSCULAR | Status: AC | PRN
  Administered 2022-11-17 (×2): 1 mg via INTRAVENOUS

## 2022-11-17 MED ORDER — SODIUM CHLORIDE 0.9% IV SOLUTION
Freq: Once | INTRAVENOUS | Status: AC
  Administered 2022-11-17: 30 mL via INTRAVENOUS

## 2022-11-17 MED ORDER — FENTANYL CITRATE (PF) 100 MCG/2ML IJ SOLN
INTRAMUSCULAR | Status: AC | PRN
  Administered 2022-11-17: 50 ug via INTRAVENOUS

## 2022-11-17 MED ORDER — FENTANYL CITRATE (PF) 100 MCG/2ML IJ SOLN
INTRAMUSCULAR | Status: AC
  Filled 2022-11-17: qty 4

## 2022-11-17 MED ORDER — MIDAZOLAM HCL 2 MG/2ML IJ SOLN
INTRAMUSCULAR | Status: AC | PRN
  Administered 2022-11-17: 1 mg via INTRAVENOUS

## 2022-11-17 MED ORDER — LIDOCAINE HCL 1 % IJ SOLN
INTRAMUSCULAR | Status: AC
  Administered 2022-11-17: 10 mL
  Filled 2022-11-17: qty 20

## 2022-11-17 MED ORDER — MIDAZOLAM HCL 2 MG/2ML IJ SOLN
INTRAMUSCULAR | Status: AC
  Filled 2022-11-17: qty 4

## 2022-11-17 NOTE — Progress Notes (Signed)
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 Biopsy is recommended but due to his neck thickening severe thrombocytopenia, this is placed on hold His chest exam is grossly abnormal With pancytopenia, I am concerned about possible metastatic disease CT imaging of the chest, abdomen and pelvis for evaluation did not reveal any signs of metastatic disease I discussed my preliminary findings with interventional radiology team We will proceed with biopsy as indicated today if possible   Worsening pancytopenia His antipsychotic medications can cause severe pancytopenia but in this situation, malignancy including bone marrow infiltration cannot be ruled out In anticipation for surgery, I recommend platelet transfusions today   We discussed some of the risks, benefits, and alternatives of platelets transfusions. The patient is symptomatic from low platelet counts with bruising/bleeding/at high risk of life-threatening bleeding and the platelet count is critically low.  Some of the side-effects to be expected including risks of transfusion reactions, chills, infection, syndrome of volume overload and risk of hospitalization from various reasons and the patient is willing to proceed and went ahead to sign consent today. I also recommend bone marrow aspirate and biopsy and he is in agreement   Borderline vitamin B12 deficiency B12 level is borderline low He is started on vitamin B12 injection daily since 11/16/2022, plan for daily injection for at least a week if possible   Neutropenia In the absence of fever, he does not need G-CSF support for neutropenia However, if he develops fever, he will need to be investigated with cultures and started on broad-spectrum IV antibiotics   Recurrent epistaxis Could be related to thrombocytopenia Will observe closely I do not appreciate any abnormalities in the sinus cavity  on his CT imaging   Smoker Bilateral lung crackles Will order CT imaging to assess  Abnormal bladder/prostate on CT imaging I will order PSA for tomorrow and urinalysis   Discharge planning He is not safe for discharge due to severe pancytopenia He needs inpatient workup as he is not safe to be discharged If bone marrow biopsy can be performed by tomorrow, I can set up outpatient follow-up next week However, if bone marrow biopsy is not going to happen until next week, I will see him back next week Will continue to follow  All questions were answered. The patient knows to call the clinic with any problems, questions or concerns.   The total time spent in the appointment was 40 minutes encounter with patients including review of chart and various tests results, discussions about plan of care and coordination of care plan  Heath Lark, MD 11/17/2022 8:02 AM  Subjective:  He feels fine.  Denies pain.  Tolerated platelet transfusion well.  We reviewed imaging study results and discussed plan of care including bone marrow aspirate and biopsy as well as transfusion support  Objective:  Vitals:   11/17/22 0542 11/17/22 0550  BP: 122/76 126/67  Pulse: 84 79  Resp: 17 18  Temp: 98.3 F (36.8 C) 97.8 F (36.6 C)  SpO2: 97% 96%     Intake/Output Summary (Last 24 hours) at 11/17/2022 0802 Last data filed at 11/17/2022 0542 Gross per 24 hour  Intake 1308 ml  Output 1200 ml  Net 108 ml    GENERAL:alert, no distress and comfortable  NEURO: alert & oriented x 3 with fluent speech, no focal motor/sensory deficits   Labs:  Recent Labs    09/26/22 1342 11/15/22  2100 11/15/22 2106 11/17/22 0351  NA 134* 136 138 134*  K 3.9 3.7 3.8 3.7  CL 100 102 99 101  CO2 25 28  --  26  GLUCOSE 91 80 79 93  BUN '14 15 14 13  '$ CREATININE 0.54* 0.56* 0.50* 0.61  CALCIUM 9.3 8.6*  --  8.6*  GFRNONAA >60 >60  --  >60  PROT  --  7.1  --  7.3  ALBUMIN  --  3.2*  --  3.2*  AST  --  12*  --  12*   ALT  --  9  --  8  ALKPHOS  --  62  --  60  BILITOT  --  0.6  --  0.8    Studies: I have personally reviewed his CT imaging CT CHEST ABDOMEN PELVIS W CONTRAST  Result Date: 11/16/2022 CLINICAL DATA:  Metastatic disease. Pancytopenia. Evaluate parotid mass. * Tracking Code: BO * EXAM: CT CHEST, ABDOMEN, AND PELVIS WITH CONTRAST TECHNIQUE: Multidetector CT imaging of the chest, abdomen and pelvis was performed following the standard protocol during bolus administration of intravenous contrast. RADIATION DOSE REDUCTION: This exam was performed according to the departmental dose-optimization program which includes automated exposure control, adjustment of the mA and/or kV according to patient size and/or use of iterative reconstruction technique. CONTRAST:  180m OMNIPAQUE IOHEXOL 300 MG/ML  SOLN COMPARISON:  None Available. FINDINGS: CT CHEST FINDINGS Cardiovascular: No significant vascular findings. Normal heart size. No pericardial effusion. Mediastinum/Nodes: No axillary or supraclavicular adenopathy. No mediastinal or hilar adenopathy. No pericardial fluid. Esophagus normal. Lungs/Pleura: Airways normal. No endobronchial lesion. No pulmonary nodules. Musculoskeletal: No aggressive osseous lesion. CT ABDOMEN AND PELVIS FINDINGS Hepatobiliary: No focal hepatic lesion. No biliary ductal dilatation. Gallbladder is normal. Common bile duct is normal. Pancreas: Pancreas is normal. No ductal dilatation. No pancreatic inflammation. Spleen: Normal spleen Adrenals/urinary tract: Adrenal glands and kidneys are normal. Ureters normal. Mild mucosal enhancement within the bladder. Stomach/Bowel: Stomach, small bowel, appendix, and cecum are normal. The colon and rectosigmoid colon are normal. Vascular/Lymphatic: Abdominal aorta is normal caliber. There is no retroperitoneal or periportal lymphadenopathy. No pelvic lymphadenopathy. Reproductive: Asymmetric enhancement in the RIGHT lobe of the prostate gland measures 2.7  x 1.8 cm (image 116/2). Enhancement within the prostatic urethra and corpus callosum of the penis additionally. Other: No free fluid. Musculoskeletal: No aggressive osseous lesion. IMPRESSION: CHEST IMPRESSION: No evidence of thoracic metastasis. PELVIS IMPRESSION: 1. No evidence metastatic disease in the abdomen pelvis. 2. Mild mucosal enhancement of the bladder mucosa, prostatic urethra, corpus callosum, and RIGHT lobe of the prostate gland. Recommend correlation for cystitis/prostatitis with urinalysis. Additionally recommend serum PSA evaluation. Electronically Signed   By: SSuzy BouchardM.D.   On: 11/16/2022 15:07   CT Soft Tissue Neck W Contrast  Result Date: 11/15/2022 CLINICAL DATA:  Palpable mass in the left neck EXAM: CT NECK WITH CONTRAST TECHNIQUE: Multidetector CT imaging of the neck was performed using the standard protocol following the bolus administration of intravenous contrast. RADIATION DOSE REDUCTION: This exam was performed according to the departmental dose-optimization program which includes automated exposure control, adjustment of the mA and/or kV according to patient size and/or use of iterative reconstruction technique. CONTRAST:  763mOMNIPAQUE IOHEXOL 300 MG/ML  SOLN COMPARISON:  No prior CT neck available, correlation is made with ultrasound 09/26/2022 and CT cervical spine 10/14/2017 FINDINGS: Pharynx and larynx: Normal. No mass or swelling. Salivary glands: Large cystic and solid mass in the left parotid gland, as seen on  the recent ultrasound, which measures up to 4.2 x 4.8 x 5.5 cm (AP x TR x CC) (series 7, image 93 and series 6, image 75) in retrospect, this may have been present on the 10/14/2017 CT, where it measured 1.5 x 3.0 x 2.8 cm (AP x TR x CC), but was primarily solid at that time. The right parotid gland and bilateral submandibular glands are unremarkable. Thyroid: Normal. Lymph nodes: Prominent left level 2A lymph node measures up to 1 cm in short axis (series 2,  image 45), although this retains normal morphology. No abnormal density lymph nodes. Vascular: Patent. Atherosclerotic calcifications at the bifurcations. Limited intracranial: Negative. Visualized orbits: Negative. Mastoids and visualized paranasal sinuses: Mucosal thickening in the ethmoid air cells. The mastoids are well aerated. Skeleton: No acute osseous abnormality. Periapical lucency about the remaining maxillary and mandibular teeth. Upper chest: No focal pulmonary opacity or pleural effusion. Emphysema. Other: None. IMPRESSION: 1. 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 Tissue sampling is recommended. 2. Prominent left level 2A lymph node, which retains normal morphology. 3. Periapical lucency about the remaining maxillary and mandibular teeth, concerning for apical periodontitis. Correlate with dental exam. Electronically Signed   By: Merilyn Baba M.D.   On: 11/15/2022 21:59

## 2022-11-17 NOTE — Procedures (Signed)
Interventional Radiology Procedure Note  Procedure: CT BM ASP AND CORE    Complications: None  Estimated Blood Loss:  MIN  Findings: 11 G CORE AND ASP    M. Daryll Brod, MD

## 2022-11-17 NOTE — TOC Progression Note (Signed)
Transition of Care [facility]) - Progression Note   Patient Details  Name: Franklin Ayers MRN: [id number] Date of Birth: [birth year]  Transition of Care [facility]) CM/SW Walker, LCSW Phone Number: 11/17/2022, 2:47 PM  Clinical Narrative: CSW spoke with Hadia at [facility] regarding discharge planning. Per Hadia, patient can be admitted back on the weekend if she receives the [facility] tomorrow as well as all changed/new medications, otherwise, patient will need to return early next week as there is more staffing in the facility. CSW updated hospitalist.  Expected Discharge Plan: Assisted Living Barriers to Discharge: Continued Medical Work up  Expected Discharge Plan and Services In-house Referral: Clinical Social Work Post Acute Care Choice:  ([facility]) Living arrangements for the past 2 months: [place]  Social Determinants of Health (SDOH) Interventions SDOH Screenings   Food Insecurity: No Food Insecurity (11/16/2022)  Housing: Low Risk  (11/16/2022)  Transportation Needs: No Transportation Needs (11/16/2022)  Utilities: Not At Risk (11/16/2022)  Alcohol Screen: Low Risk  (07/26/2017)  Tobacco Use: Medium Risk (11/15/2022)   Readmission Risk Interventions     No data to display

## 2022-11-17 NOTE — Procedures (Signed)
Interventional Radiology Procedure Note  Procedure: [place] LEFT PAROTID ASP AND BX    Complications: None  Estimated Blood Loss:  MIN  Findings: ASP FOR CYTO CORE FOR PATH     Tamera Punt, MD

## 2022-11-17 NOTE — Plan of Care (Signed)
?  Problem: Education: ?Goal: Knowledge of General Education information will improve ?Description: Including pain rating scale, medication(s)/side effects and non-pharmacologic comfort measures ?Outcome: Progressing ?  ?Problem: Health Behavior/Discharge Planning: ?Goal: Ability to manage health-related needs will improve ?Outcome: Progressing ?  ?Problem: Coping: ?Goal: Level of anxiety will decrease ?Outcome: Progressing ?  ?

## 2022-11-17 NOTE — Assessment & Plan Note (Signed)
.   Patient is being counseled daily on smoking cessation. . Providing patient with nicotine replacement therapy during this hospitalization.   

## 2022-11-17 NOTE — Progress Notes (Signed)
PROGRESS NOTE   Franklin Ayers  [id number] DOB: 1961/09/10 DOA: 11/15/2022 PCP: Pcp, No   Date of Service: the patient was seen and examined on 11/17/2022  Brief Narrative:  62 year old male with PMHx significant of Schizophrenia / schizoaffective disorder.  He resides in SNF at baseline.  Patient presented to [facility] emergency department due to pancytopenia and an enlarging mass on the left side of the face.  Upon evaluation in the emergency department significant pancytopenia was confirmed.  Case was discussed with [name] such with hematology/oncology.  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  The hospitalist group was called to assess the patient for admission the hospital.  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  IR consultation was obtained.    Assessment and Plan: * Pancytopenia ([place]) Multiple possible etiologies including being medication induced secondary psychiatric regimen or possibly secondary to underlying malignancy.   Per my discussions with [name] with psychiatry on 3/6 patient is being placed on a regimen of Zyprexa, Ativan and clonazepam for now while holding Depakote, mirtazapine and quetiapine. With the assistance of [name] with heme-onc, patient has underwent parotid and bone marrow biopsies today.  Patient has received 2 separate platelet transfusions of 1 pack each. Monitoring cell lines with serial CBCs No clinical evidence of bleeding at this time. Will discharge once pancytopenia is confirmed to be stable.  Patient will require close outpatient follow-up with both psychiatry and hematology.  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 with oncology following, her input is appreciated Patient will need close outpatient oncology follow-up  Schizoaffective disorder [facility]) Symptoms remain relatively well-controlled despite holding several of the occasions since admission Partial Sumption of psychiatric regimen as noted above   Benign prostatic hyperplasia Continuing home regimen of tamsulosin  Nicotine dependence, cigarettes, uncomplicated Patient is being counseled daily on smoking cessation. Providing patient with nicotine replacement therapy during this hospitalization.       Subjective:  Patient states that his mild left facial pain and swelling are unchanged.  Patient denies any bleeding symptoms or significant bruising.  Patient complains of mild generalized weakness.  Physical Exam:  Vitals:   11/17/22 1129 11/17/22 1155 11/17/22 1402 11/17/22 2133  BP: 115/69 119/70 (!) 96/59 110/68  Pulse: 72 77 68 90  Resp: '17 16 18 17  '$ Temp: 97.6 F (36.4 C) 98.4 F (36.9 C) 97.9 F (36.6 C) 98.6 F (37 C)  TempSrc: Oral Oral Oral Oral  SpO2: 98% 97% 96% 98%  Height:         Constitutional: Awake alert and oriented x3, no associated distress.   Skin: no rashes, no lesions, poor skin turgor noted. Eyes: Pupils are equally reactive to light.  No evidence of scleral icterus or conjunctival pallor.  ENMT: Extensive left parotid swelling.  Mass is firm, minimally tender and fixed on palpation.  Unchanged from yesterday.  Moist mucous membranes noted.  Posterior pharynx clear of any exudate or lesions.   Respiratory: clear to auscultation bilaterally, no wheezing, no crackles. Normal respiratory effort. No accessory muscle use.  Cardiovascular: Regular rate and rhythm, no murmurs / rubs / gallops. No extremity edema. 2+ pedal pulses. No carotid bruits.  Abdomen: Abdomen is soft  and nontender.  No evidence of intra-abdominal masses.  Positive bowel sounds noted in all quadrants.   Musculoskeletal: No  joint deformity upper and lower extremities. Good ROM, no contractures. Normal muscle tone.    Data Reviewed:  I have personally reviewed and interpreted labs, imaging.  Significant findings are   CBC: Recent Labs  Lab 11/15/22 2100 11/15/22 2106 11/16/22 0320 11/17/22 0351  WBC 1.8*  --   --  1.7*  NEUTROABS 0.1*  --   --  0.5*  HGB 9.1* 9.9*  --  9.6*  HCT 29.4* 29.0*  --  30.9*  MCV 88.6  --   --  89.0  PLT 15*  --  16* 29*   Basic Metabolic Panel: Recent Labs  Lab 11/15/22 2100 11/15/22 2106 11/17/22 0351  NA 136 138 134*  K 3.7 3.8 3.7  CL 102 99 101  CO2 28  --  26  GLUCOSE 80 79 93  BUN '15 14 13  '$ CREATININE 0.56* 0.50* 0.61  CALCIUM 8.6*  --  8.6*  MG  --   --  1.9   GFR: CrCl cannot be calculated (Unknown ideal weight.). Liver Function Tests: Recent Labs  Lab 11/15/22 2100 11/17/22 0351  AST 12* 12*  ALT 9 8  ALKPHOS 62 60  BILITOT 0.6 0.8  PROT 7.1 7.3  ALBUMIN 3.2* 3.2*    Coagulation Profile: Recent Labs  Lab 11/16/22 0320  INR 1.3*      Code Status:  Full code.  Code status decision has been confirmed with: patient   Severity of Illness:  The appropriate patient status for this patient is INPATIENT. Inpatient status is judged to be reasonable and necessary in order to provide the required intensity of service to ensure the patient's safety. The patient's presenting symptoms, physical exam findings, and initial radiographic and laboratory data in the context of their chronic comorbidities is felt to place them at high risk for further clinical deterioration. Furthermore, it is not anticipated that the patient will be medically stable for discharge from the hospital within 2 midnights of admission.   * I certify that at the point of admission it is my clinical judgment that the patient will require inpatient hospital care spanning beyond 2 midnights from the point of admission due to high intensity of service, high risk for further  deterioration and high frequency of surveillance required.*  Time spent: 50  minutes  Author:  Vernelle Emerald MD  11/17/2022 10:25 PM

## 2022-11-18 ENCOUNTER — Other Ambulatory Visit: Payer: Self-pay | Admitting: Hematology and Oncology

## 2022-11-18 ENCOUNTER — Telehealth: Payer: Self-pay

## 2022-11-18 DIAGNOSIS — D61818 Other pancytopenia: Secondary | ICD-10-CM | POA: Diagnosis not present

## 2022-11-18 DIAGNOSIS — F259 Schizoaffective disorder, unspecified: Secondary | ICD-10-CM | POA: Diagnosis not present

## 2022-11-18 DIAGNOSIS — N400 Enlarged prostate without lower urinary tract symptoms: Secondary | ICD-10-CM | POA: Diagnosis not present

## 2022-11-18 DIAGNOSIS — D490 Neoplasm of unspecified behavior of digestive system: Secondary | ICD-10-CM | POA: Diagnosis not present

## 2022-11-18 LAB — CBC WITH DIFFERENTIAL/PLATELET
Abs Immature Granulocytes: 0.09 10*3/uL — ABNORMAL HIGH (ref 0.00–0.07)
Basophils Absolute: 0 10*3/uL (ref 0.0–0.1)
Basophils Relative: 1 %
Eosinophils Absolute: 0.1 10*3/uL (ref 0.0–0.5)
Eosinophils Relative: 5 %
HCT: 29 % — ABNORMAL LOW (ref 39.0–52.0)
Hemoglobin: 8.9 g/dL — ABNORMAL LOW (ref 13.0–17.0)
Immature Granulocytes: 6 %
Lymphocytes Relative: 58 %
Lymphs Abs: 0.9 10*3/uL (ref 0.7–4.0)
MCH: 27.6 pg (ref 26.0–34.0)
MCHC: 30.7 g/dL (ref 30.0–36.0)
MCV: 90.1 fL (ref 80.0–100.0)
Monocytes Absolute: 0.2 10*3/uL (ref 0.1–1.0)
Monocytes Relative: 15 %
Neutro Abs: 0.2 10*3/uL — CL (ref 1.7–7.7)
Neutrophils Relative %: 15 %
Platelets: 43 10*3/uL — ABNORMAL LOW (ref 150–400)
RBC: 3.22 MIL/uL — ABNORMAL LOW (ref 4.22–5.81)
RDW: 18.8 % — ABNORMAL HIGH (ref 11.5–15.5)
WBC: 1.5 10*3/uL — ABNORMAL LOW (ref 4.0–10.5)
nRBC: 4 % — ABNORMAL HIGH (ref 0.0–0.2)

## 2022-11-18 LAB — PSA: Prostatic Specific Antigen: 1.77 ng/mL (ref 0.00–4.00)

## 2022-11-18 LAB — PREPARE PLATELET PHERESIS: Unit division: 0

## 2022-11-18 LAB — BPAM PLATELET PHERESIS
Blood Product Expiration Date: 202403082359
ISSUE DATE / TIME: 202403071103
Unit Type and Rh: 5100

## 2022-11-18 LAB — SURGICAL PATHOLOGY

## 2022-11-18 LAB — CYTOLOGY - NON PAP

## 2022-11-18 MED ORDER — LORAZEPAM 0.5 MG PO TABS: 0.5000 mg | ORAL_TABLET | Freq: Two times a day (BID) | ORAL | 0 refills | Status: DC

## 2022-11-18 MED ORDER — VITAMIN B-12 1000 MCG PO TABS: 1000.0000 ug | ORAL_TABLET | Freq: Every day | ORAL | 2 refills | Status: AC

## 2022-11-18 MED ORDER — SULFAMETHOXAZOLE-TRIMETHOPRIM 800-160 MG PO TABS: 1.0000 | ORAL_TABLET | Freq: Two times a day (BID) | ORAL | 0 refills | Status: AC

## 2022-11-18 NOTE — Consult Note (Signed)
[facility] Face-to-Face Psychiatric Evaluation   Service Date: November 18, 2022 LOS:  LOS: 2 days  Reason for Consult: Concern for agranulocytosis Consult by: Jennette Kettle  Assessment  Franklin Ayers is a 62 y.o. male w/ hx of schizophrenia, catatonia, and admitted medically for 11/15/2022  8:32 PM for pancytopenia and concern for malignancy.  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 Per sister and group home administrator, patient has been psychiatrically stable for the past few months.   He continues to be psychiatrically stable with zyprexa and ativan. Defer re-initiation of other psychotropic medications to outpatient psychiatrist based on re-emergence of psychotic or mood symptoms..   Plan  ## Safety and Observation Level:  - Based on my clinical evaluation, I estimate the patient to be at low risk of self harm in the current setting   Schizophrenia --Continue zyprexa 20 mg qhs for psychosis --Continue ativan 0.5 mg bid --Holding depakote given risk for thrombocytopenia --holding mirtazapine and quetiapine given mild risk of neutropenia and uncertain if patient requires these medications Pertinent labs: platelets 15, WBC 1.8, Hgb 9.1  ## Disposition:  -- per primary team  Thank you for this consult request. Recommendations have been communicated to the primary team.  We will sign off at this time.   France Ravens, MD PGY2 Psychiatry Resident  Relevant History  Relevant Aspects of Hospital Course:  Admitted on 11/15/2022 for pancytopenia and concern for malignancy.  Patient Report:  Patient was seen and assessed this AM eating breakfast.  Reports feeling "pretty good". AxOx4 and able to perform DOWB. He denies  present SI/HI/AVH. Reports sleeping and eating well. I discussed he should follow up with his outpatient psychiatrist for careful monitoring for re-emergence of psychosis. He was agreeable to only continuing zyprexa and ativan for now.   ROS:  Denies SI/HI/AVH  Collateral information (from 11/16/22):  Sister [name]) [phone] Sister visits patient at least once a week at [facility]. She states he has been relatively stable for past several months but has had fluctuations in mood and odd behaviors for a long period of time. Odd behavior include dressing bizarrely and leaving facility at times. She adamantly states that while he has odd behaviors, he has never shown any aggression or attempted self-harm in any way that she knows of. She stayed with patient most of last night and she feels he is currently psychiatrically stable.  [name] Production designer, [employer] [place]) [phone]  [name] notes that patient for past few months has been calmer. Couple months ago, he was irate and would leave facility for 3 days at a time stating that he had friends "out there" and that he did not like being in closed walls. He would also refuse medications and have bizarre behaviors where he will put on 5-6 layers of clothes for no apparent reason. He is allowed to do this given he is his own guardian. He has not had any problems with irritability or agitation for past few months. No changes to medications have been done in past several months with the exception of receiving the zyprexa LAI Relprev in January before it was recalled.    Past Psychiatric Hx: Previous Psych Diagnoses: schizoaffective disorder/schizophrenia Prior inpatient treatment: multiple inpt hospitalization, last [facility] admission  2018 History of suicide: denies History of homicide: denies Psychiatric medication compliance history:fair  Social History: hx of smoking but denies illicit substance use   Family  History:  The patient's family history includes Cancer in an other family member; Stroke in an other family member.  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HCC)    Cystitis    Dysphagia    Hypokalemia    Manic depression (HCC)    Mental disorder    Schizoaffective disorder ([place])     Surgical History: Past Surgical History:  Procedure Laterality Date   TONSILLECTOMY      Medications:   Current Facility-Administered Medications:    albuterol (PROVENTIL) (2.5 MG/3ML) 0.083% nebulizer solution 3 mL, 3 mL, Inhalation, Q6H PRN, Alcario Drought, Jared M, DO   clonazePAM Bobbye [place]) tablet 0.5 mg, 0.5 mg, Oral, BID PRN, Alcario Drought, Jared M, DO   cyanocobalamin (VITAMIN B12) injection 1,000 mcg, 1,000 mcg, Intramuscular, Daily, Gorsuch, Ni, MD, 1,000 mcg at 11/17/22 1040   LORazepam (ATIVAN) tablet 0.5 mg, 0.5 mg, Oral, BID, Alcario Drought, Jared M, DO, 0.5 mg at 11/17/22 2216   multivitamin with minerals tablet 1 tablet, 1 tablet, Oral, Daily, Alcario Drought, Jared M, DO, 1 tablet at 11/17/22 1035   nicotine (NICODERM CQ - dosed in mg/24 hours) patch 21 mg, 21 mg, Transdermal, Daily, Shalhoub, Sherryll Burger, MD, 21 mg at 11/17/22 1035   OLANZapine (ZYPREXA) tablet 20 mg, 20 mg, Oral, QHS, France Ravens, MD, 20 mg at 11/17/22 2215   sulfamethoxazole-trimethoprim (BACTRIM DS) 800-160 MG per tablet 1 tablet, 1 tablet, Oral, Q12H, Shalhoub, Sherryll Burger, MD, 1 tablet at 11/18/22 0005   tamsulosin (FLOMAX) capsule 0.4 mg, 0.4 mg, Oral, Daily, Alcario Drought, Jared M, DO, 0.4 mg at 11/17/22 1035  Allergies: Allergies  Allergen Reactions   Penicillins Shortness Of Breath    .Has patient had a PCN reaction causing immediate rash, facial/tongue/throat swelling, SOB or lightheadedness with hypotension: Yes Has patient had a PCN reaction causing severe rash involving mucus membranes or skin necrosis: yes Has patient had a PCN reaction that required  hospitalization: no Has patient had a PCN reaction occurring within the last 10 years: no If all of the above answers are "NO", then may proceed with Cephalosporin use.        Objective  Vital signs:  Temp:  [97.6 F (36.4 C)-98.6 F (37 C)] 98.1 F (36.7 C) (03/08 0617) Pulse Rate:  [68-90] 70 (03/08 0617) Resp:  [16-18] 17 (03/08 0617) BP: (96-119)/(59-70) 111/61 (03/08 0617) SpO2:  [96 %-99 %] 99 % (03/08 0617)  Psychiatric Specialty Exam:  Presentation  General Appearance:  Casual  Eye Contact: Fair  Speech: Clear and Coherent  Speech Volume: Normal   Mood and Affect  Mood: Euthymic  Affect: Congruent   Thought Process  Thought Processes: Coherent  Descriptions of Associations: logical Orientation:Full (Time, Place and Person)  Thought Content:WDL  Hallucinations: denies Suicidal Thoughts:denies Homicidal Thoughts:denies  Sensorium  Memory: Immediate Fair  Judgment: Intact  Insight: Present   Community education officer  Concentration: Fair  Attention Span: Fair  Recall: [employer] of Knowledge: Fair  Language: Fair   Psychomotor Activity  Psychomotor Activity: normal  Assets  Assets: Housing; Leisure Time; Social Support   Sleep  Sleep: fair   Physical Exam: Physical Exam Vitals and nursing note reviewed.

## 2022-11-18 NOTE — Progress Notes (Signed)
PT Cancellation Note  Patient Details Name: Franklin Ayers MRN: [id number] DOB: 1961/04/22   Cancelled Treatment:     PT up ad lib in room and up to ambulate in hall demonstrating good balance and good safety awareness.  No PT needs identified and PT service will sign off.   [name] 11/18/2022, 12:08 PM

## 2022-11-18 NOTE — Progress Notes (Signed)
Critical lab: absolute neutrophil count 0.2. Paged Raenette Rover.

## 2022-11-18 NOTE — Discharge Instructions (Signed)
Please take all prescribed medications exactly as instructed.  A number of medication changes have been made with your psychiatric medications.  You are to now only take Zyprexa once daily in addition to Ativan twice daily.  Please abstain from taking any further medications until otherwise instructed by your outpatient psychiatrist. Please follow-up with 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   Please advance your diet as tolerated Please advance your physical activity as tolerated Please return to the emergency department if you develop worsening weakness, any evidence of bleeding, fevers or increasing facial pain.

## 2022-11-18 NOTE — Progress Notes (Signed)
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 Biopsy is recommended but due to his neck thickening severe thrombocytopenia, this is placed on hold His chest exam is grossly abnormal With pancytopenia, I am concerned about possible metastatic disease CT imaging of the chest, abdomen and pelvis for evaluation did not reveal any signs of metastatic disease I discussed my preliminary findings with interventional radiology team Biopsy was performed on 3/7 I will see him next week on 3/14 to review test results   Worsening pancytopenia His antipsychotic medications can cause severe pancytopenia but in this situation, malignancy including bone marrow infiltration cannot be ruled out I also recommend bone marrow aspirate and biopsy and he is in agreement, performed on 3/7 I will review test results next week and recheck his CBC next week   Borderline vitamin B12 deficiency B12 level is borderline low He is started on vitamin B12 injection daily since 11/16/2022, plan for daily injection until discharge  Neutropenia In the absence of fever, he does not need G-CSF support for neutropenia However, if he develops fever, he will need to be investigated with cultures and started on broad-spectrum IV antibiotics Recommend neutropenic precaution   Recurrent epistaxis Could be related to thrombocytopenia Will observe closely I do not appreciate any abnormalities in the sinus cavity on his CT imaging   Abnormal bladder/prostate on CT imaging PSA is within normal range   Discharge planning He can be discharged from my perspective He is set up to return to my office on 3/14 at 1:15 PM for blood work followed by consultation afterwards.  A copy of the appointment details is given to the patient  All questions were answered. The patient knows to call the clinic with any problems, questions or concerns.   The  total time spent in the appointment was 25 minutes encounter with patients including review of chart and various tests results, discussions about plan of care and coordination of care plan  Heath Lark, MD 11/18/2022 10:10 AM  Subjective:  He feels fine.  Tolerated biopsy well.  No complaints  Objective:  Vitals:   11/17/22 2133 11/18/22 0617  BP: 110/68 111/61  Pulse: 90 70  Resp: 17 17  Temp: 98.6 F (37 C) 98.1 F (36.7 C)  SpO2: 98% 99%     Intake/Output Summary (Last 24 hours) at 11/18/2022 1010 Last data filed at 11/17/2022 2133 Gross per 24 hour  Intake 1848.94 ml  Output 300 ml  Net 1548.94 ml    GENERAL:alert, no distress and comfortable  NEURO: alert & oriented x 3 with fluent speech, no focal motor/sensory deficits   Labs:  Recent Labs    09/26/22 1342 11/15/22 2100 11/15/22 2106 11/17/22 0351  NA 134* 136 138 134*  K 3.9 3.7 3.8 3.7  CL 100 102 99 101  CO2 25 28  --  26  GLUCOSE 91 80 79 93  BUN '14 15 14 13  '$ CREATININE 0.54* 0.56* 0.50* 0.61  CALCIUM 9.3 8.6*  --  8.6*  GFRNONAA >60 >60  --  >60  PROT  --  7.1  --  7.3  ALBUMIN  --  3.2*  --  3.2*  AST  --  12*  --  12*  ALT  --  9  --  8  ALKPHOS  --  62  --  60  BILITOT  --  0.6  --  0.8    Studies:  [place] CORE BIOPSY (SOFT TISSUE)  Result Date: 11/17/2022 INDICATION: ENLARGING LEFT PAROTID MIXED CYSTIC/SOLID MASS EXAM: ULTRASOUND ASPIRATION AND CORE BIOPSY OF THE LEFT PAROTID MASS MEDICATIONS: 1% LIDOCAINE LOCAL ANESTHESIA/SEDATION: Moderate (conscious) sedation was employed during this procedure. A total of Versed 1 mg and Fentanyl 50 mcg was administered intravenously by the radiology nurse. Total intra-service moderate Sedation Time: 10 minutes. The patient's level of consciousness and vital signs were monitored continuously by radiology nursing throughout the procedure under my direct supervision. COMPLICATIONS: None immediate. PROCEDURE: Informed written consent was obtained from the patient after a  thorough discussion of the procedural risks, benefits and alternatives. All questions were addressed. Maximal Sterile Barrier Technique was utilized including caps, mask, sterile gowns, sterile gloves, sterile drape, hand hygiene and skin antiseptic. A timeout was performed prior to the initiation of the procedure. Previous imaging reviewed. Preliminary ultrasound performed. The left parotid cystic/solid mass was ultrasound and marked for biopsy. Ultrasound aspiration: Initially, under sterile conditions and local anesthesia, an 18 gauge needle was advanced into the dominant cystic portion of the lesion. Syringe aspiration yielded 30 cc blood tinged cellular debris-filled fluid. Sample sent for cytology. This collapse the majority of the cystic components. Ultrasound core biopsy: Also under sterile conditions and local anesthesia, an 18 gauge biopsy was advanced to the residual solid component. Several 18 gauge core biopsies obtained. Samples were placed in saline. Images obtained for documentation. No immediate complication. Patient tolerated the procedure well. IMPRESSION: Successful ultrasound aspiration of the left parotid mass for cytology Successful ultrasound core biopsy of the left parotid mass for surgical pathology. Electronically Signed   By: Jerilynn Mages.  Shick M.D.   On: 11/17/2022 11:53   [place] FNA SALIVARY GLAND/PAROTID GLAND  Result Date: 11/17/2022 INDICATION: ENLARGING LEFT PAROTID MIXED CYSTIC/SOLID MASS EXAM: ULTRASOUND ASPIRATION AND CORE BIOPSY OF THE LEFT PAROTID MASS MEDICATIONS: 1% LIDOCAINE LOCAL ANESTHESIA/SEDATION: Moderate (conscious) sedation was employed during this procedure. A total of Versed 1 mg and Fentanyl 50 mcg was administered intravenously by the radiology nurse. Total intra-service moderate Sedation Time: 10 minutes. The patient's level of consciousness and vital signs were monitored continuously by radiology nursing throughout the procedure under my direct supervision. COMPLICATIONS:  None immediate. PROCEDURE: Informed written consent was obtained from the patient after a thorough discussion of the procedural risks, benefits and alternatives. All questions were addressed. Maximal Sterile Barrier Technique was utilized including caps, mask, sterile gowns, sterile gloves, sterile drape, hand hygiene and skin antiseptic. A timeout was performed prior to the initiation of the procedure. Previous imaging reviewed. Preliminary ultrasound performed. The left parotid cystic/solid mass was ultrasound and marked for biopsy. Ultrasound aspiration: Initially, under sterile conditions and local anesthesia, an 18 gauge needle was advanced into the dominant cystic portion of the lesion. Syringe aspiration yielded 30 cc blood tinged cellular debris-filled fluid. Sample sent for cytology. This collapse the majority of the cystic components. Ultrasound core biopsy: Also under sterile conditions and local anesthesia, an 18 gauge biopsy was advanced to the residual solid component. Several 18 gauge core biopsies obtained. Samples were placed in saline. Images obtained for documentation. No immediate complication. Patient tolerated the procedure well. IMPRESSION: Successful ultrasound aspiration of the left parotid mass for cytology Successful ultrasound core biopsy of the left parotid mass for surgical pathology. Electronically Signed   By: Jerilynn Mages.  Shick M.D.   On: 11/17/2022 11:53   CT BONE MARROW BIOPSY & ASPIRATION  Result Date: 11/17/2022 INDICATION: PANCYTOPENIA EXAM: CT GUIDED RIGHT  ILIAC BONE MARROW ASPIRATION AND CORE BIOPSY Date:  11/17/2022 11/17/2022 10:20 am Radiologist:  M. Daryll Brod, MD Guidance:  CT FLUOROSCOPY: Fluoroscopy Time: None. MEDICATIONS: 1% lidocaine ANESTHESIA/SEDATION: 2.0 mg IV Versed; 100 mcg IV Fentanyl Moderate Sedation Time:  10 minute The patient was continuously monitored during the procedure by the interventional radiology nurse under my direct supervision. CONTRAST:  None.  COMPLICATIONS: None PROCEDURE: Informed consent was obtained from the patient following explanation of the procedure, risks, benefits and alternatives. The patient understands, agrees and consents for the procedure. All questions were addressed. A time out was performed. The patient was positioned prone and non-contrast localization CT was performed of the pelvis to demonstrate the iliac marrow spaces. Maximal barrier sterile technique utilized including caps, mask, sterile gowns, sterile gloves, large sterile drape, hand hygiene, and Betadine prep. Under sterile conditions and local anesthesia, an 11 gauge coaxial bone biopsy needle was advanced into the right iliac marrow space. Needle position was confirmed with CT imaging. Initially, bone marrow aspiration was performed. Next, the 11 gauge outer cannula was utilized to obtain a right iliac bone marrow core biopsy. Needle was removed. Hemostasis was obtained with compression. The patient tolerated the procedure well. Samples were prepared with the cytotechnologist. No immediate complications. IMPRESSION: CT guided right iliac bone marrow aspiration and core biopsy. Electronically Signed   By: Jerilynn Mages.  Shick M.D.   On: 11/17/2022 11:46   CT CHEST ABDOMEN PELVIS W CONTRAST  Result Date: 11/16/2022 CLINICAL DATA:  Metastatic disease. Pancytopenia. Evaluate parotid mass. * Tracking Code: BO * EXAM: CT CHEST, ABDOMEN, AND PELVIS WITH CONTRAST TECHNIQUE: Multidetector CT imaging of the chest, abdomen and pelvis was performed following the standard protocol during bolus administration of intravenous contrast. RADIATION DOSE REDUCTION: This exam was performed according to the departmental dose-optimization program which includes automated exposure control, adjustment of the mA and/or kV according to patient size and/or use of iterative reconstruction technique. CONTRAST:  178m OMNIPAQUE IOHEXOL 300 MG/ML  SOLN COMPARISON:  None Available. FINDINGS: CT CHEST FINDINGS  Cardiovascular: No significant vascular findings. Normal heart size. No pericardial effusion. Mediastinum/Nodes: No axillary or supraclavicular adenopathy. No mediastinal or hilar adenopathy. No pericardial fluid. Esophagus normal. Lungs/Pleura: Airways normal. No endobronchial lesion. No pulmonary nodules. Musculoskeletal: No aggressive osseous lesion. CT ABDOMEN AND PELVIS FINDINGS Hepatobiliary: No focal hepatic lesion. No biliary ductal dilatation. Gallbladder is normal. Common bile duct is normal. Pancreas: Pancreas is normal. No ductal dilatation. No pancreatic inflammation. Spleen: Normal spleen Adrenals/urinary tract: Adrenal glands and kidneys are normal. Ureters normal. Mild mucosal enhancement within the bladder. Stomach/Bowel: Stomach, small bowel, appendix, and cecum are normal. The colon and rectosigmoid colon are normal. Vascular/Lymphatic: Abdominal aorta is normal caliber. There is no retroperitoneal or periportal lymphadenopathy. No pelvic lymphadenopathy. Reproductive: Asymmetric enhancement in the RIGHT lobe of the prostate gland measures 2.7 x 1.8 cm (image 116/2). Enhancement within the prostatic urethra and corpus callosum of the penis additionally. Other: No free fluid. Musculoskeletal: No aggressive osseous lesion. IMPRESSION: CHEST IMPRESSION: No evidence of thoracic metastasis. PELVIS IMPRESSION: 1. No evidence metastatic disease in the abdomen pelvis. 2. Mild mucosal enhancement of the bladder mucosa, prostatic urethra, corpus callosum, and RIGHT lobe of the prostate gland. Recommend correlation for cystitis/prostatitis with urinalysis. Additionally recommend serum PSA evaluation. Electronically Signed   By: SSuzy BouchardM.D.   On: 11/16/2022 15:07   CT Soft Tissue Neck W Contrast  Result Date: 11/15/2022 CLINICAL DATA:  Palpable mass in the left neck EXAM: CT NECK WITH  CONTRAST TECHNIQUE: Multidetector CT imaging of the neck was performed using the standard protocol following the  bolus administration of intravenous contrast. RADIATION DOSE REDUCTION: This exam was performed according to the departmental dose-optimization program which includes automated exposure control, adjustment of the mA and/or kV according to patient size and/or use of iterative reconstruction technique. CONTRAST:  36m OMNIPAQUE IOHEXOL 300 MG/ML  SOLN COMPARISON:  No prior CT neck available, correlation is made with ultrasound 09/26/2022 and CT cervical spine 10/14/2017 FINDINGS: Pharynx and larynx: Normal. No mass or swelling. Salivary glands: Large cystic and solid mass in the left parotid gland, as seen on the recent ultrasound, which measures up to 4.2 x 4.8 x 5.5 cm (AP x TR x CC) (series 7, image 93 and series 6, image 75) in retrospect, this may have been present on the 10/14/2017 CT, where it measured 1.5 x 3.0 x 2.8 cm (AP x TR x CC), but was primarily solid at that time. The right parotid gland and bilateral submandibular glands are unremarkable. Thyroid: Normal. Lymph nodes: Prominent left level 2A lymph node measures up to 1 cm in short axis (series 2, image 45), although this retains normal morphology. No abnormal density lymph nodes. Vascular: Patent. Atherosclerotic calcifications at the bifurcations. Limited intracranial: Negative. Visualized orbits: Negative. Mastoids and visualized paranasal sinuses: Mucosal thickening in the ethmoid air cells. The mastoids are well aerated. Skeleton: No acute osseous abnormality. Periapical lucency about the remaining maxillary and mandibular teeth. Upper chest: No focal pulmonary opacity or pleural effusion. Emphysema. Other: None. IMPRESSION: 1. 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 Tissue sampling is recommended. 2. Prominent left level 2A lymph node, which retains normal morphology. 3. 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 Correlate with dental exam. Electronically Signed   By: AMerilyn BabaM.D.   On: 11/15/2022 21:59

## 2022-11-18 NOTE — Discharge Summary (Addendum)
Physician Discharge Summary   Patient: Franklin Ayers MRN: [id number] DOB: 06-26-1961  Admit date:     11/15/2022  Discharge date: 11/18/22  Discharge Physician: Franklin Ayers   PCP: Franklin Ayers   Recommendations at discharge:    Please take all prescribed medications exactly as instructed.  A number of medication changes have been made with your psychiatric medications.  You are to now only take Zyprexa once daily in addition to Ativan twice daily.  Please abstain from taking any further medications until otherwise instructed by your outpatient psychiatrist. 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   Please advance your diet as tolerated Please advance your physical activity as tolerated Please return to the emergency department if you develop worsening weakness, any evidence of bleeding, fevers or increasing facial pain.  Discharge Diagnoses: Principal Problem:   Pancytopenia ([place]) Active Problems:   Tumor of parotid gland   Schizoaffective disorder ([place])   Benign prostatic hyperplasia   Nicotine dependence, cigarettes, uncomplicated  Resolved Problems:   * Ayers resolved hospital problems. *   Hospital Course: 62 year old male with PMHx significant of Schizophrenia / schizoaffective disorder.  He resides in SNF at baseline.  Patient presented to [facility] emergency department due to pancytopenia and an enlarging mass on the left side of the face.  Upon evaluation in the emergency department significant pancytopenia was confirmed.  Case was discussed with Franklin Ayers such with hematology/oncology.  The hospitalist group was called to assess the patient for admission the hospital.  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  Several of the patient's antipsychotic medications were therefore held with ongoing discussions with both Franklin Ayers with the psychiatric service as well as Franklin Ayers with hematology oncology.  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  IR consultation was obtained and under the direction of oncology both a bone marrow biopsy of the left facial mass as well as a bone marrow biopsy were performed successfully on 3/7.  In preparation for this procedure patient received a transfusion of 2 packs of platelets.  Patient's pancytopenia remained stable throughout the hospitalization with Ayers episodes of bleeding.  Further workup did reveal that the patient also had a low normal vitamin B12 and therefore patient was given an intramuscular injection of vitamin B12 during this hospitalization followed by 1000 mcg daily of oral vitamin B12 at time of discharge.  Concerning patient's psychiatric regimen, patient was transitioned to Zyprexa monotherapy alongside Ativan twice daily.  At time of discharge both psychiatry and hematology recommended continuing to hold Depakote mirtazapine and quetiapine while patient was undergoing workup.  Consideration of resuming these medications can be undertaken upon outpatient follow-up with his psychiatrist after more workup of the patient's suspected malignancy is complete.  Patient was discharged back to his group home in improved and stable condition on 11/18/2022.  Arranges were made for the patient to follow-up closely as an outpatient with Franklin. Homero Ayers with oncology.  Patient was also advised to follow-up with his outpatient psychiatrist in 1 to 2 weeks for further evaluation.  UPDATE:  I have been contacted by Franklin Ayers who has just received the preliminary bone marrow  biopsy results.   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  This would certainly carry a guarded prognosis especially considering the fact the patient is likely suffering from a concurrent head and neck cancer with a parotid mass.  I spoke to the patient and informed him of these preliminary findings prior to him being discharged in the building.  I additionally spoke to his sister with his permission via phone conversation and updated her as well.  Franklin Ayers informed that she will contact the patient again on Monday with a complete biopsy results.  She also has strongly encouraged the patient to follow-up as scheduled this coming Thursday with her.  If the patient develops any complications in the meantime such as bleeding or fever he may be best served at a tertiary care center such as [facility] health.     Pain control - [employer] Controlled Substance Reporting System database was reviewed. and patient was instructed, not to drive, operate heavy machinery, perform activities at heights, swimming or participation in water activities or provide baby-sitting services while on Pain, Sleep and Anxiety Medications; until their outpatient Physician has advised to do so again. Also recommended to not to take more than prescribed Pain, Sleep and Anxiety Medications.   Consultants: Franklin Ayers with Oncology.  Franklin Ayers with Psychiatry Procedures performed: IR guided bone marrow biopsy and parotid gland biopsy on 11/17/2022. Disposition:  group home Diet recommendation:  Regular diet  DISCHARGE MEDICATION: Allergies as of 11/18/2022       Reactions   Penicillins Shortness Of Breath   .Has patient had a PCN reaction causing immediate rash, facial/tongue/throat swelling, SOB or lightheadedness with hypotension: Yes Has patient had a PCN reaction causing severe rash involving mucus membranes or skin necrosis: yes Has patient had a PCN reaction that required  hospitalization: Ayers Has patient had a PCN reaction occurring within the last 10 years: Ayers If all of the above answers are "Ayers", then may proceed with Cephalosporin use.        Medication List     STOP taking these medications    cholecalciferol 25 MCG (1000 UT) tablet Generic drug: Cholecalciferol   clonazePAM 0.5 MG tablet Commonly known as: KLONOPIN   divalproex 500 MG Franklin tablet Commonly known as: DEPAKOTE   mirtazapine 30 MG tablet Commonly known as: REMERON   multivitamin with minerals Tabs tablet   sennosides-docusate sodium 8.6-50 MG tablet Commonly known as: SENOKOT-S   SEROquel 400 MG tablet Generic drug: QUEtiapine       TAKE these medications    albuterol 108 (90 Base) MCG/ACT inhaler Commonly known as: VENTOLIN HFA Inhale 2 puffs into the lungs every 6 (six) hours as needed for wheezing or shortness of breath.   cyanocobalamin 1000 MCG tablet Commonly known as: VITAMIN B12 Take 1 tablet (1,000 mcg total) by mouth daily.   LORazepam 0.5 MG tablet Commonly known as: Ativan Take 1 tablet (0.5 mg total) by mouth 2 (two) times daily.   OLANZapine zydis 20 MG disintegrating tablet Commonly known as: ZYPREXA Take 20 mg by mouth daily.   sulfamethoxazole-trimethoprim 800-160 MG tablet Commonly known as: BACTRIM DS Take 1 tablet by mouth every 12 (twelve) hours for 12 doses. First dose the evening of 3/8.   tamsulosin 0.4 MG Caps capsule Commonly known as: FLOMAX Take 2 capsules (0.8 mg total) by mouth daily. What changed: how much to take        Follow-up Information  Heath Lark, MD. Go to.   Specialty: Hematology and Oncology Contact information: [place] [id number] [phone]         Your outpatient mental health provider. Schedule an appointment as soon as possible for a visit in 1 week(s).                  Discharge Exam: There were Ayers vitals filed for this visit.  Constitutional:  Awake alert and oriented x3, Ayers associated distress.   Respiratory: clear to auscultation bilaterally, Ayers wheezing, Ayers crackles. Normal respiratory effort. Ayers accessory muscle use.  Cardiovascular: Regular rate and rhythm, Ayers murmurs / rubs / gallops. Ayers extremity edema. 2+ pedal pulses. Ayers carotid bruits.  Abdomen: Abdomen is soft and nontender.  Ayers evidence of intra-abdominal masses.  Positive bowel sounds noted in all quadrants.   Musculoskeletal: Ayers joint deformity upper and lower extremities. Good ROM, Ayers contractures. Normal muscle tone.     Condition at discharge: fair  The results of significant diagnostics from this hospitalization (including imaging, microbiology, ancillary and laboratory) are listed below for reference.   Imaging Studies: [place] CORE BIOPSY (SOFT TISSUE)  Result Date: 11/17/2022 INDICATION: ENLARGING LEFT PAROTID MIXED CYSTIC/SOLID MASS EXAM: ULTRASOUND ASPIRATION AND CORE BIOPSY OF THE LEFT PAROTID MASS MEDICATIONS: 1% LIDOCAINE LOCAL ANESTHESIA/SEDATION: Moderate (conscious) sedation was employed during this procedure. A total of Versed 1 mg and Fentanyl 50 mcg was administered intravenously by the radiology nurse. Total intra-service moderate Sedation Time: 10 minutes. The patient's level of consciousness and vital signs were monitored continuously by radiology nursing throughout the procedure under my direct supervision. COMPLICATIONS: None immediate. PROCEDURE: Informed written consent was obtained from the patient after a thorough discussion of the procedural risks, benefits and alternatives. All questions were addressed. Maximal Sterile Barrier Technique was utilized including caps, mask, sterile gowns, sterile gloves, sterile drape, hand hygiene and skin antiseptic. A timeout was performed prior to the initiation of the procedure. Previous imaging reviewed. Preliminary ultrasound performed. The left parotid cystic/solid mass was ultrasound and marked for biopsy. Ultrasound  aspiration: Initially, under sterile conditions and local anesthesia, an 18 gauge needle was advanced into the dominant cystic portion of the lesion. Syringe aspiration yielded 30 cc blood tinged cellular debris-filled fluid. Sample sent for cytology. This collapse the majority of the cystic components. Ultrasound core biopsy: Also under sterile conditions and local anesthesia, an 18 gauge biopsy was advanced to the residual solid component. Several 18 gauge core biopsies obtained. Samples were placed in saline. Images obtained for documentation. Ayers immediate complication. Patient tolerated the procedure well. IMPRESSION: Successful ultrasound aspiration of the left parotid mass for cytology Successful ultrasound core biopsy of the left parotid mass for surgical pathology. Electronically Signed   By: Jerilynn Mages.  Shick M.D.   On: 11/17/2022 11:53   [place] FNA SALIVARY GLAND/PAROTID GLAND  Result Date: 11/17/2022 INDICATION: ENLARGING LEFT PAROTID MIXED CYSTIC/SOLID MASS EXAM: ULTRASOUND ASPIRATION AND CORE BIOPSY OF THE LEFT PAROTID MASS MEDICATIONS: 1% LIDOCAINE LOCAL ANESTHESIA/SEDATION: Moderate (conscious) sedation was employed during this procedure. A total of Versed 1 mg and Fentanyl 50 mcg was administered intravenously by the radiology nurse. Total intra-service moderate Sedation Time: 10 minutes. The patient's level of consciousness and vital signs were monitored continuously by radiology nursing throughout the procedure under my direct supervision. COMPLICATIONS: None immediate. PROCEDURE: Informed written consent was obtained from the patient after a thorough discussion of the procedural risks, benefits and alternatives. All questions were addressed. Maximal Sterile Barrier Technique was  utilized including caps, mask, sterile gowns, sterile gloves, sterile drape, hand hygiene and skin antiseptic. A timeout was performed prior to the initiation of the procedure. Previous imaging reviewed. Preliminary ultrasound  performed. The left parotid cystic/solid mass was ultrasound and marked for biopsy. Ultrasound aspiration: Initially, under sterile conditions and local anesthesia, an 18 gauge needle was advanced into the dominant cystic portion of the lesion. Syringe aspiration yielded 30 cc blood tinged cellular debris-filled fluid. Sample sent for cytology. This collapse the majority of the cystic components. Ultrasound core biopsy: Also under sterile conditions and local anesthesia, an 18 gauge biopsy was advanced to the residual solid component. Several 18 gauge core biopsies obtained. Samples were placed in saline. Images obtained for documentation. Ayers immediate complication. Patient tolerated the procedure well. IMPRESSION: Successful ultrasound aspiration of the left parotid mass for cytology Successful ultrasound core biopsy of the left parotid mass for surgical pathology. Electronically Signed   By: Jerilynn Mages.  Shick M.D.   On: 11/17/2022 11:53   CT BONE MARROW BIOPSY & ASPIRATION  Result Date: 11/17/2022 INDICATION: PANCYTOPENIA EXAM: CT GUIDED RIGHT ILIAC BONE MARROW ASPIRATION AND CORE BIOPSY Date:  11/17/2022 11/17/2022 10:20 am Radiologist:  M. Daryll Brod, MD Guidance:  CT FLUOROSCOPY: Fluoroscopy Time: None. MEDICATIONS: 1% lidocaine ANESTHESIA/SEDATION: 2.0 mg IV Versed; 100 mcg IV Fentanyl Moderate Sedation Time:  10 minute The patient was continuously monitored during the procedure by the interventional radiology nurse under my direct supervision. CONTRAST:  None. COMPLICATIONS: None PROCEDURE: Informed consent was obtained from the patient following explanation of the procedure, risks, benefits and alternatives. The patient understands, agrees and consents for the procedure. All questions were addressed. A time out was performed. The patient was positioned prone and non-contrast localization CT was performed of the pelvis to demonstrate the iliac marrow spaces. Maximal barrier sterile technique utilized including caps,  mask, sterile gowns, sterile gloves, large sterile drape, hand hygiene, and Betadine prep. Under sterile conditions and local anesthesia, an 11 gauge coaxial bone biopsy needle was advanced into the right iliac marrow space. Needle position was confirmed with CT imaging. Initially, bone marrow aspiration was performed. Next, the 11 gauge outer cannula was utilized to obtain a right iliac bone marrow core biopsy. Needle was removed. Hemostasis was obtained with compression. The patient tolerated the procedure well. Samples were prepared with the cytotechnologist. Ayers immediate complications. IMPRESSION: CT guided right iliac bone marrow aspiration and core biopsy. Electronically Signed   By: Jerilynn Mages.  Shick M.D.   On: 11/17/2022 11:46   CT CHEST ABDOMEN PELVIS W CONTRAST  Result Date: 11/16/2022 CLINICAL DATA:  Metastatic disease. Pancytopenia. Evaluate parotid mass. * Tracking Code: BO * EXAM: CT CHEST, ABDOMEN, AND PELVIS WITH CONTRAST TECHNIQUE: Multidetector CT imaging of the chest, abdomen and pelvis was performed following the standard protocol during bolus administration of intravenous contrast. RADIATION DOSE REDUCTION: This exam was performed according to the departmental dose-optimization program which includes automated exposure control, adjustment of the mA and/or kV according to patient size and/or use of iterative reconstruction technique. CONTRAST:  121m OMNIPAQUE IOHEXOL 300 MG/ML  SOLN COMPARISON:  None Available. FINDINGS: CT CHEST FINDINGS Cardiovascular: Ayers significant vascular findings. Normal heart size. Ayers pericardial effusion. Mediastinum/Nodes: Ayers axillary or supraclavicular adenopathy. Ayers mediastinal or hilar adenopathy. Ayers pericardial fluid. Esophagus normal. Lungs/Pleura: Airways normal. Ayers endobronchial lesion. Ayers pulmonary nodules. Musculoskeletal: Ayers aggressive osseous lesion. CT ABDOMEN AND PELVIS FINDINGS Hepatobiliary: Ayers focal hepatic lesion. Ayers biliary ductal dilatation. Gallbladder  is normal. Common bile duct is normal.  Pancreas: Pancreas is normal. Ayers ductal dilatation. Ayers pancreatic inflammation. Spleen: Normal spleen Adrenals/urinary tract: Adrenal glands and kidneys are normal. Ureters normal. Mild mucosal enhancement within the bladder. Stomach/Bowel: Stomach, small bowel, appendix, and cecum are normal. The colon and rectosigmoid colon are normal. Vascular/Lymphatic: Abdominal aorta is normal caliber. There is Ayers retroperitoneal or periportal lymphadenopathy. Ayers pelvic lymphadenopathy. Reproductive: Asymmetric enhancement in the RIGHT lobe of the prostate gland measures 2.7 x 1.8 cm (image 116/2). Enhancement within the prostatic urethra and corpus callosum of the penis additionally. Other: Ayers free fluid. Musculoskeletal: Ayers aggressive osseous lesion. IMPRESSION: CHEST IMPRESSION: Ayers evidence of thoracic metastasis. PELVIS IMPRESSION: 1. Ayers evidence metastatic disease in the abdomen pelvis. 2. Mild mucosal enhancement of the bladder mucosa, prostatic urethra, corpus callosum, and RIGHT lobe of the prostate gland. Recommend correlation for cystitis/prostatitis with urinalysis. Additionally recommend serum PSA evaluation. Electronically Signed   By: Suzy Bouchard M.D.   On: 11/16/2022 15:07   CT Soft Tissue Neck W Contrast  Result Date: 11/15/2022 CLINICAL DATA:  Palpable mass in the left neck EXAM: CT NECK WITH CONTRAST TECHNIQUE: Multidetector CT imaging of the neck was performed using the standard protocol following the bolus administration of intravenous contrast. RADIATION DOSE REDUCTION: This exam was performed according to the departmental dose-optimization program which includes automated exposure control, adjustment of the mA and/or kV according to patient size and/or use of iterative reconstruction technique. CONTRAST:  74m OMNIPAQUE IOHEXOL 300 MG/ML  SOLN COMPARISON:  Ayers prior CT neck available, correlation is made with ultrasound 09/26/2022 and CT cervical spine  10/14/2017 FINDINGS: Pharynx and larynx: Normal. Ayers mass or swelling. Salivary glands: Large cystic and solid mass in the left parotid gland, as seen on the recent ultrasound, which measures up to 4.2 x 4.8 x 5.5 cm (AP x TR x CC) (series 7, image 93 and series 6, image 75) in retrospect, this may have been present on the 10/14/2017 CT, where it measured 1.5 x 3.0 x 2.8 cm (AP x TR x CC), but was primarily solid at that time. The right parotid gland and bilateral submandibular glands are unremarkable. Thyroid: Normal. Lymph nodes: Prominent left level 2A lymph node measures up to 1 cm in short axis (series 2, image 45), although this retains normal morphology. Ayers abnormal density lymph nodes. Vascular: Patent. Atherosclerotic calcifications at the bifurcations. Limited intracranial: Negative. Visualized orbits: Negative. Mastoids and visualized paranasal sinuses: Mucosal thickening in the ethmoid air cells. The mastoids are well aerated. Skeleton: Ayers acute osseous abnormality. Periapical lucency about the remaining maxillary and mandibular teeth. Upper chest: Ayers focal pulmonary opacity or pleural effusion. Emphysema. Other: None. IMPRESSION: 1. 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 Tissue sampling is recommended. 2. Prominent left level 2A lymph node, which retains normal morphology. 3. Periapical lucency about the remaining maxillary and mandibular teeth, concerning for apical periodontitis. Correlate with dental exam. Electronically Signed   By: AMerilyn BabaM.D.   On: 11/15/2022 21:59    Microbiology: Results for orders placed or performed during the hospital encounter of 11/15/22  MRSA Next Gen by PCR, Nasal     Status: None   Collection Time: 11/16/22  6:23 AM   Specimen: Nasal Mucosa; Nasal Swab  Result Value Ref Range Status   MRSA by PCR Next Gen NOT DETECTED NOT DETECTED Final    Comment: (NOTE) The GeneXpert  MRSA Assay (FDA approved for NASAL specimens  only), is one component of a comprehensive MRSA colonization surveillance program. It is not intended to diagnose MRSA infection nor to guide or monitor treatment for MRSA infections. Test performance is not FDA approved in patients less than 59 years old. Performed at [facility], [place] [address], [place], [place] [address]     Labs: CBC: Recent Labs  Lab 11/15/22 2100 11/15/22 2106 11/16/22 0320 11/17/22 0351 11/18/22 0359  WBC 1.8*  --   --  1.7* 1.5*  NEUTROABS 0.1*  --   --  0.5* 0.2*  HGB 9.1* 9.9*  --  9.6* 8.9*  HCT 29.4* 29.0*  --  30.9* 29.0*  MCV 88.6  --   --  89.0 90.1  PLT 15*  --  16* 29* 43*   Basic Metabolic Panel: Recent Labs  Lab 11/15/22 2100 11/15/22 2106 11/17/22 0351  NA 136 138 134*  K 3.7 3.8 3.7  CL 102 99 101  CO2 28  --  26  GLUCOSE 80 79 93  BUN '15 14 13  '$ CREATININE 0.56* 0.50* 0.61  CALCIUM 8.6*  --  8.6*  MG  --   --  1.9   Liver Function Tests: Recent Labs  Lab 11/15/22 2100 11/17/22 0351  AST 12* 12*  ALT 9 8  ALKPHOS 62 60  BILITOT 0.6 0.8  PROT 7.1 7.3  ALBUMIN 3.2* 3.2*   CBG: Ayers results for input(s): "GLUCAP" in the last 168 hours.  Discharge time spent: greater than 30 minutes.  Signed: Vernelle Emerald, MD [facility] 11/18/2022

## 2022-11-18 NOTE — TOC Transition Note (Signed)
Transition of Care [facility]) - CM/SW Discharge Note  Patient Details  Name: Franklin Ayers MRN: [id number] Date of Birth: 10/08/1960  [facility] CM/SW Contact:  Sherie Don, LCSW Phone Number: 11/18/2022, 3:10 PM  Clinical Narrative: CSW spoke with Wells Guiles at [facility] and confirmed the patient can return today. Discharge summary and FL2 faxed to facility [phone]). CSW made 10+ attempts to reach [employer] again, but could not reach anyone due to a busy signal. CSW left VM for Lovell Sheehan with DSS regarding being unable to reach [employer]. CSW received call from the ALF's administrator, Heidiann, confirming the patient can return today and transportation will pick up the patient. Paperwork was received via fax. CSW received call from [facility] with DSS to confirm the ALF followed up with CSW. RN updated. Discharge packet completed. TOC signing off.    Final next level of care: Assisted Living Barriers to Discharge: Barriers Resolved  Patient Goals and CMS Choice Choice offered to / list presented to : NA  Discharge Placement Patient to be transferred to facility by: [facility]'s transportation  Discharge Plan and Services Additional resources added to the After Visit Summary for   In-house Referral: Clinical Social Work Post Acute Care Choice:  ([facility])          DME Arranged: N/A DME Agency: NA  Social Determinants of Health (SDOH) Interventions SDOH Screenings   Food Insecurity: No Food Insecurity (11/16/2022)  Housing: Low Risk  (11/16/2022)  Transportation Needs: No Transportation Needs (11/16/2022)  Utilities: Not At Risk (11/16/2022)  Alcohol Screen: Low Risk  (07/26/2017)  Tobacco Use: Medium Risk (11/15/2022)   Readmission Risk Interventions     No data to display

## 2022-11-18 NOTE — Telephone Encounter (Signed)
Per 3/9 IB reached out to patient to schedule, all numbers are unavailable.

## 2022-11-18 NOTE — [place] FL2 (Addendum)
[place] MEDICAID FL2 LEVEL OF CARE FORM     IDENTIFICATION  Patient Name: Franklin Ayers Birthdate: 11-Jan-1961 Sex: male Admission Date (Current Location): 11/15/2022  [place] and [place] Number:  Kathleen Argue [id number] [place] and Address:  [facility],  [place], [place]      Provider Number: [id number]  Attending Physician Name and Address:  Vernelle Emerald, MD  Relative Name and Phone Number:  Ammon Forestier (sister) Ph: [phone]    Current Level of Care: Hospital Recommended Level of Care: [facility]) Prior Approval Number:    Date Approved/Denied:   PASRR Number:    Discharge Plan: Other (Comment) ([facility])    Current Diagnoses: Patient Active Problem List   Diagnosis Date Noted   Nicotine dependence, cigarettes, uncomplicated [id number]   Pancytopenia ([place]) 11/15/2022   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   Bacterial infection due to Morganella morganii 11/20/2017   Fever of unknown origin 11/16/2017   Physical deconditioning 11/08/2017   Benign prostatic hyperplasia    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[place])    Hematuria    Soft tissue infection    Malnutrition of moderate degree 10/18/2017   Acute urinary retention    Anemia    Hypokalemia    Bladder distension    Urinary tract infection with hematuria    Altered mental status 10/14/2017   Schizoaffective disorder ([place]) 12/23/2016    Orientation RESPIRATION BLADDER Height & Weight     Self, Time, Situation, Place  Normal Continent Weight:   Height:  '5\' 8"'$  (172.7 cm)  BEHAVIORAL SYMPTOMS/MOOD NEUROLOGICAL BOWEL NUTRITION STATUS     (N/A) Continent Diet (Regular diet)  AMBULATORY STATUS COMMUNICATION OF NEEDS Skin   Independent Verbally Skin abrasions (Abrasion: left abrasion)                       Personal Care Assistance Level of Assistance  Bathing, Feeding, Dressing Bathing Assistance: Independent Feeding  assistance: Independent Dressing Assistance: Independent     Functional Limitations Info  Sight, Hearing, Speech Sight Info: Adequate Hearing Info: Adequate Speech Info: Adequate    SPECIAL CARE FACTORS FREQUENCY                       Contractures Contractures Info: Not present    Additional Factors Info  Code Status, Allergies, Psychotropic Code Status Info: Full Allergies Info: Penicillins Psychotropic Info: Ativan, Klonopin         Current Medications (11/18/2022):  This is the current hospital active medication list Current Facility-Administered Medications  Medication Dose Route Frequency Provider Last Rate Last Admin   albuterol (PROVENTIL) (2.5 MG/3ML) 0.083% nebulizer solution 3 mL  3 mL Inhalation Q6H PRN Etta Quill, DO       clonazePAM Bobbye [place]) tablet 0.5 mg  0.5 mg Oral BID PRN Etta Quill, DO       cyanocobalamin (VITAMIN B12) injection 1,000 mcg  1,000 mcg Intramuscular Daily [name], Ni, MD   1,000 mcg at 11/17/22 1040   LORazepam (ATIVAN) tablet 0.5 mg  0.5 mg Oral BID Etta Quill, DO   0.5 mg at 11/18/22 1105   multivitamin with minerals tablet 1 tablet  1 tablet Oral Daily Jennette Kettle M, DO   1 tablet at 11/18/22 1105   nicotine (NICODERM CQ - dosed in mg/24 hours) patch 21 mg  21 mg Transdermal Daily Vernelle Emerald, MD   21 mg at 11/17/22 1035  OLANZapine (ZYPREXA) tablet 20 mg  20 mg Oral QHS France Ravens, MD   20 mg at 11/17/22 2215   sulfamethoxazole-trimethoprim (BACTRIM DS) 800-160 MG per tablet 1 tablet  1 tablet Oral Q12H Shalhoub, Sherryll Burger, MD   1 tablet at 11/18/22 1106   tamsulosin (FLOMAX) capsule 0.4 mg  0.4 mg Oral Daily Jennette Kettle M, DO   0.4 mg at 11/18/22 1106     Discharge Medications: Please see discharge summary for a list of discharge medications.  TAKE these medications     albuterol 108 (90 Base) MCG/ACT inhaler Commonly known as: VENTOLIN HFA Inhale 2 puffs into the lungs every 6 (six) hours as  needed for wheezing or shortness of breath.    cyanocobalamin 1000 MCG tablet Commonly known as: VITAMIN B12 Take 1 tablet (1,000 mcg total) by mouth daily.    LORazepam 0.5 MG tablet Commonly known as: Ativan Take 1 tablet (0.5 mg total) by mouth 2 (two) times daily.    OLANZapine zydis 20 MG disintegrating tablet Commonly known as: ZYPREXA Take 20 mg by mouth daily.    sulfamethoxazole-trimethoprim 800-160 MG tablet Commonly known as: BACTRIM DS Take 1 tablet by mouth every 12 (twelve) hours for 12 doses. First dose the evening of 3/8.    tamsulosin 0.4 MG Caps capsule Commonly known as: FLOMAX Take 2 capsules (0.8 mg total) by mouth daily. What changed: how much to take      Relevant Imaging Results:  Relevant Lab Results:   Additional Information SSN: [id number]  Sherie Don, LCSW

## 2022-11-19 LAB — URINE CULTURE

## 2022-11-21 LAB — SURGICAL PATHOLOGY

## 2022-11-22 ENCOUNTER — Encounter: Payer: Self-pay | Admitting: Hematology and Oncology

## 2022-11-22 ENCOUNTER — Telehealth: Payer: Self-pay

## 2022-11-22 NOTE — Telephone Encounter (Signed)
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 They ask that the referral be faxed to [phone], faxed and received fax confirmation.

## 2022-11-24 ENCOUNTER — Encounter: Payer: Self-pay | Admitting: Hematology and Oncology

## 2022-11-24 ENCOUNTER — Telehealth: Payer: Self-pay

## 2022-11-24 ENCOUNTER — Inpatient Hospital Stay (HOSPITAL_BASED_OUTPATIENT_CLINIC_OR_DEPARTMENT_OTHER): Payer: Medicare Other | Admitting: Hematology and Oncology

## 2022-11-24 ENCOUNTER — Inpatient Hospital Stay: Payer: Medicare Other | Attending: Hematology and Oncology

## 2022-11-24 VITALS — BP 115/65 | HR 90 | Temp 98.0°F | Resp 20 | Ht 68.0 in | Wt 127.8 lb

## 2022-11-24 DIAGNOSIS — F17200 Nicotine dependence, unspecified, uncomplicated: Secondary | ICD-10-CM | POA: Insufficient documentation

## 2022-11-24 DIAGNOSIS — C9200 Acute myeloblastic leukemia, not having achieved remission: Secondary | ICD-10-CM

## 2022-11-24 DIAGNOSIS — D61818 Other pancytopenia: Secondary | ICD-10-CM | POA: Diagnosis not present

## 2022-11-24 DIAGNOSIS — D119 Benign neoplasm of major salivary gland, unspecified: Secondary | ICD-10-CM | POA: Diagnosis not present

## 2022-11-24 LAB — CBC WITH DIFFERENTIAL (CANCER CENTER ONLY)
Abs Immature Granulocytes: 0 10*3/uL (ref 0.00–0.07)
Basophils Absolute: 0.1 10*3/uL (ref 0.0–0.1)
Basophils Relative: 4 %
Blasts: 2 %
Eosinophils Absolute: 0 10*3/uL (ref 0.0–0.5)
Eosinophils Relative: 2 %
HCT: 33.3 % — ABNORMAL LOW (ref 39.0–52.0)
Hemoglobin: 10.6 g/dL — ABNORMAL LOW (ref 13.0–17.0)
Lymphocytes Relative: 77 %
Lymphs Abs: 1.8 10*3/uL (ref 0.7–4.0)
MCH: 28 pg (ref 26.0–34.0)
MCHC: 31.8 g/dL (ref 30.0–36.0)
MCV: 87.9 fL (ref 80.0–100.0)
Metamyelocytes Relative: 1 %
Monocytes Absolute: 0.2 10*3/uL (ref 0.1–1.0)
Monocytes Relative: 8 %
Neutro Abs: 0.1 10*3/uL — CL (ref 1.7–7.7)
Neutrophils Relative %: 6 %
Platelet Count: 18 10*3/uL — ABNORMAL LOW (ref 150–400)
RBC: 3.79 MIL/uL — ABNORMAL LOW (ref 4.22–5.81)
RDW: 18.6 % — ABNORMAL HIGH (ref 11.5–15.5)
Smear Review: DECREASED
WBC Count: 2.4 10*3/uL — ABNORMAL LOW (ref 4.0–10.5)
nRBC: 0 % (ref 0.0–0.2)

## 2022-11-24 LAB — SAMPLE TO BLOOD BANK

## 2022-11-24 NOTE — Progress Notes (Signed)
Franklin Ayers OFFICE PROGRESS NOTE  Patient Care Team: Pcp, No as PCP - General  ASSESSMENT & PLAN:  AML (acute myeloid leukemia) ([place]) I reviewed results from bone marrow biopsy and gave a copy to the patient We have sent urgent referral to [facility], appointment is pending next week Unfortunately, we are not able to offer him chemotherapy here at [facility] I have requested cytogenetics and molecular testing with next generation sequencing with myeloid panel for prognostication He is getting more thrombocytopenic but so far he is not symptomatic We discussed close follow-up I will tentatively see him back next Tuesday for further blood transfusion but he is aware that if he is sick, he needs to go to emergency department for urgent evaluation  Franklin Ayers Results from recent biopsy showed possible Franklin Ayers In the scope of things, we will focus on treatment for AML He could benefit from referral to see ENT service  Pancytopenia [facility]) He is neutropenic and we discussed neutropenic precaution He does not need transfusion support right now  No orders of the defined types were placed in this encounter.   All questions were answered. The patient knows to call the clinic with any problems, questions or concerns. The total time spent in the appointment was 55 minutes encounter with patients including review of chart and various tests results, discussions about plan of care and coordination of care plan   Franklin Lark, MD 11/24/2022 3:59 PM  INTERVAL HISTORY: Please see below for problem oriented charting. he returns for review of test results.  His sister is present.  Since he was discharged from the hospital, he denies recent bleeding. I gave him copies of pathology reports from parotid biopsy as well as bone marrow biopsy  REVIEW OF SYSTEMS:   Constitutional: Denies fevers, chills or abnormal weight loss Eyes: Denies blurriness of vision Ears, nose, mouth,  throat, and face: Denies mucositis or sore throat Respiratory: Denies cough, dyspnea or wheezes Cardiovascular: Denies palpitation, chest discomfort or lower extremity swelling Gastrointestinal:  Denies nausea, heartburn or change in bowel habits Skin: Denies abnormal skin rashes Lymphatics: Denies new lymphadenopathy or easy bruising Neurological:Denies numbness, tingling or new weaknesses Behavioral/Psych: Mood is stable, no new changes  All other systems were reviewed with the patient and are negative.  I have reviewed the past medical history, past surgical history, social history and family history with the patient and they are unchanged from previous note.  ALLERGIES:  is allergic to penicillins.  MEDICATIONS:  Current Outpatient Medications  Medication Sig Dispense Refill   albuterol (VENTOLIN HFA) 108 (90 Base) MCG/ACT inhaler Inhale 2 puffs into the lungs every 6 (six) hours as needed for wheezing or shortness of breath.     cyanocobalamin (VITAMIN B12) 1000 MCG tablet Take 1 tablet (1,000 mcg total) by mouth daily. 30 tablet 2   LORazepam (ATIVAN) 0.5 MG tablet Take 1 tablet (0.5 mg total) by mouth 2 (two) times daily. 60 tablet 0   OLANZapine zydis (ZYPREXA) 20 MG disintegrating tablet Take 20 mg by mouth daily.     sulfamethoxazole-trimethoprim (BACTRIM DS) 800-160 MG tablet Take 1 tablet by mouth every 12 (twelve) hours for 12 doses. First dose the evening of 3/8. 12 tablet 0   tamsulosin (FLOMAX) 0.4 MG CAPS capsule Take 2 capsules (0.8 mg total) by mouth daily. (Patient taking differently: Take 0.4 mg by mouth daily.) 30 capsule    No current facility-administered medications for this visit.    SUMMARY OF ONCOLOGIC  HISTORY: Oncology History  AML (acute myeloid leukemia) ([place])  11/15/2022 Imaging   CT neck 1. 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 Tissue sampling is recommended. 2.  Prominent left level 2A lymph node, which retains normal morphology. 3. Periapical lucency about the remaining maxillary and mandibular teeth, concerning for apical periodontitis. Correlate with dental exam.   11/16/2022 Initial Diagnosis   Franklin Ayers 62 y.o. male is admitted to the hospital due to enlarging left parotid mass and severe pancytopenia.  He was seen on 11/16/2022 after admission to the hospital to expedite workup. The patient lives in a group home He has significant psychiatric history He has been a smoker for over 30 years He has noted enlarging left sided parotid mass for many months He was sent to the emergency department approximately in January 2024 and subsequently discharged He was sent back to the emergency department yesterday due to worsening pancytopenia and enlarging left sided parotid mass Blood work confirms severe pancytopenia CT imaging showed large mass suspicious for malignancy He is being admitted for further investigations He has noted recurrent epistaxis on a regular basis for several months Denies hematuria or hematochezia Denies difficulties with swallowing or voice changes No recent weight loss Denies alcohol intake   11/16/2022 Imaging   CHEST IMPRESSION:   No evidence of thoracic metastasis.   PELVIS IMPRESSION:   1. No evidence metastatic disease in the abdomen pelvis. 2. Mild mucosal enhancement of the bladder mucosa, prostatic urethra, corpus callosum, and RIGHT lobe of the prostate gland. Recommend correlation for cystitis/prostatitis with urinalysis. Additionally recommend serum PSA evaluation.     11/17/2022 Bone Marrow Biopsy   Surgical Pathology  CASE: [id number]  PATIENT: Franklin Ayers  Bone Marrow Report   Clinical History: Pancytopenia ([place])   DIAGNOSIS:   BONE MARROW, ASPIRATE, CLOT, CORE:  -Hypercellular bone marrow with acute myeloid leukemia  -See comment   PERIPHERAL BLOOD:  -Pancytopenia with circulating blasts    COMMENT:   The acute leukemic process appears to be arising in a background of dyspoietic changes.  Correlation with cytogenetic and molecular studies is recommended.    11/17/2022 Pathology Results   A. PAROTID, LEFT, BIOPSY:  -  Scattered fragments of squamous epithelium with mild atypia and cystic dilatation with associated adjacent abundant chronic inflammatory cells/lymphoid-type tissue, see note  -  Separate fragments of unremarkable salivary gland tissue.   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  Given the mild cytologic atypia and fragmented nature of the specimen, however, a well-differentiated squamous cell carcinoma with cystic appearance cannot be completely excluded, but this is considered much less likely. It is also noted that there is a concurrent bone marrow, there is no evidence of a hematolymphoid malignancy present in the specimen.  Clinical/radiologic correlation recommended.      11/24/2022 Initial Diagnosis   AML (acute myeloid leukemia) ([place])     PHYSICAL EXAMINATION: ECOG PERFORMANCE STATUS: 1 - Symptomatic but completely ambulatory  Vitals:   11/24/22 1330  BP: 115/65  Pulse: 90  Resp: 20  Temp: 98 F (36.7 C)  SpO2: 100%   Filed Weights   11/24/22 1330  Weight: 127 lb 12.8 oz (58 kg)    GENERAL:alert, no distress and comfortable.  Large parotid masses noted on the left NEURO: alert & oriented x 3 with fluent speech, no focal motor/sensory deficits  LABORATORY DATA:  I have reviewed the data as listed    Component Value Date/Time   NA 134 (L) 11/17/2022 0351   NA 138 11/16/2017 0000   K 3.7 11/17/2022 0351   CL 101 11/17/2022 0351   CO2 26 11/17/2022 0351   GLUCOSE 93 11/17/2022 0351   BUN 13 11/17/2022 0351   BUN 13 11/16/2017 0000   CREATININE 0.61 11/17/2022 0351   CALCIUM 8.6 (L) 11/17/2022 0351   PROT 7.3 11/17/2022 0351   ALBUMIN 3.2 (L)  11/17/2022 0351   AST 12 (L) 11/17/2022 0351   ALT 8 11/17/2022 0351   ALKPHOS 60 11/17/2022 0351   BILITOT 0.8 11/17/2022 0351   GFRNONAA >60 11/17/2022 0351   GFRAA >60 06/15/2019 1404    No results found for: "SPEP", "UPEP"  Lab Results  Component Value Date   WBC 2.4 (L) 11/24/2022   NEUTROABS 0.1 (LL) 11/24/2022   HGB 10.6 (L) 11/24/2022   HCT 33.3 (L) 11/24/2022   MCV 87.9 11/24/2022   PLT 18 (L) 11/24/2022      Chemistry      Component Value Date/Time   NA 134 (L) 11/17/2022 0351   NA 138 11/16/2017 0000   K 3.7 11/17/2022 0351   CL 101 11/17/2022 0351   CO2 26 11/17/2022 0351   BUN 13 11/17/2022 0351   BUN 13 11/16/2017 0000   CREATININE 0.61 11/17/2022 0351   GLU 92 11/16/2017 0000      Component Value Date/Time   CALCIUM 8.6 (L) 11/17/2022 0351   ALKPHOS 60 11/17/2022 0351   AST 12 (L) 11/17/2022 0351   ALT 8 11/17/2022 0351   BILITOT 0.8 11/17/2022 0351       RADIOGRAPHIC STUDIES: I have personally reviewed the radiological images as listed and agreed with the findings in the report. [place] CORE BIOPSY (SOFT TISSUE)  Result Date: 11/17/2022 INDICATION: ENLARGING LEFT PAROTID MIXED CYSTIC/SOLID MASS EXAM: ULTRASOUND ASPIRATION AND CORE BIOPSY OF THE LEFT PAROTID MASS MEDICATIONS: 1% LIDOCAINE LOCAL ANESTHESIA/SEDATION: Moderate (conscious) sedation was employed during this procedure. A total of Versed 1 mg and Fentanyl 50 mcg was administered intravenously by the radiology nurse. Total intra-service moderate Sedation Time: 10 minutes. The patient's level of consciousness and vital signs were monitored continuously by radiology nursing throughout the procedure under my direct supervision. COMPLICATIONS: None immediate. PROCEDURE: Informed written consent was obtained from the patient after a thorough discussion of the procedural risks, benefits and alternatives. All questions were addressed. Maximal Sterile Barrier Technique was utilized including caps, mask,  sterile gowns, sterile gloves, sterile drape, hand hygiene and skin antiseptic. A timeout was performed prior to the initiation of the procedure. Previous imaging reviewed. Preliminary ultrasound performed. The left parotid cystic/solid mass was ultrasound and marked for biopsy. Ultrasound aspiration: Initially, under sterile conditions and local anesthesia, an 18 gauge needle was advanced into the dominant cystic portion of the lesion. Syringe aspiration yielded 30 cc blood tinged cellular debris-filled fluid. Sample sent for cytology. This collapse the majority of the cystic components. Ultrasound core biopsy: Also under sterile conditions and local anesthesia, an 18 gauge biopsy was advanced to the residual solid component. Several 18 gauge core biopsies obtained. Samples were placed in saline. Images obtained for documentation. No immediate complication. Patient tolerated the procedure well. IMPRESSION: Successful ultrasound aspiration of the left parotid mass for cytology Successful ultrasound core biopsy of the left parotid mass for surgical pathology. Electronically Signed   By: Jerilynn Mages.  Shick M.D.   On: 11/17/2022  11:53   [place] FNA SALIVARY GLAND/PAROTID GLAND  Result Date: 11/17/2022 INDICATION: ENLARGING LEFT PAROTID MIXED CYSTIC/SOLID MASS EXAM: ULTRASOUND ASPIRATION AND CORE BIOPSY OF THE LEFT PAROTID MASS MEDICATIONS: 1% LIDOCAINE LOCAL ANESTHESIA/SEDATION: Moderate (conscious) sedation was employed during this procedure. A total of Versed 1 mg and Fentanyl 50 mcg was administered intravenously by the radiology nurse. Total intra-service moderate Sedation Time: 10 minutes. The patient's level of consciousness and vital signs were monitored continuously by radiology nursing throughout the procedure under my direct supervision. COMPLICATIONS: None immediate. PROCEDURE: Informed written consent was obtained from the patient after a thorough discussion of the procedural risks, benefits and alternatives. All  questions were addressed. Maximal Sterile Barrier Technique was utilized including caps, mask, sterile gowns, sterile gloves, sterile drape, hand hygiene and skin antiseptic. A timeout was performed prior to the initiation of the procedure. Previous imaging reviewed. Preliminary ultrasound performed. The left parotid cystic/solid mass was ultrasound and marked for biopsy. Ultrasound aspiration: Initially, under sterile conditions and local anesthesia, an 18 gauge needle was advanced into the dominant cystic portion of the lesion. Syringe aspiration yielded 30 cc blood tinged cellular debris-filled fluid. Sample sent for cytology. This collapse the majority of the cystic components. Ultrasound core biopsy: Also under sterile conditions and local anesthesia, an 18 gauge biopsy was advanced to the residual solid component. Several 18 gauge core biopsies obtained. Samples were placed in saline. Images obtained for documentation. No immediate complication. Patient tolerated the procedure well. IMPRESSION: Successful ultrasound aspiration of the left parotid mass for cytology Successful ultrasound core biopsy of the left parotid mass for surgical pathology. Electronically Signed   By: Jerilynn Mages.  Shick M.D.   On: 11/17/2022 11:53   CT BONE MARROW BIOPSY & ASPIRATION  Result Date: 11/17/2022 INDICATION: PANCYTOPENIA EXAM: CT GUIDED RIGHT ILIAC BONE MARROW ASPIRATION AND CORE BIOPSY Date:  11/17/2022 11/17/2022 10:20 am Radiologist:  M. Daryll Brod, MD Guidance:  CT FLUOROSCOPY: Fluoroscopy Time: None. MEDICATIONS: 1% lidocaine ANESTHESIA/SEDATION: 2.0 mg IV Versed; 100 mcg IV Fentanyl Moderate Sedation Time:  10 minute The patient was continuously monitored during the procedure by the interventional radiology nurse under my direct supervision. CONTRAST:  None. COMPLICATIONS: None PROCEDURE: Informed consent was obtained from the patient following explanation of the procedure, risks, benefits and alternatives. The patient  understands, agrees and consents for the procedure. All questions were addressed. A time out was performed. The patient was positioned prone and non-contrast localization CT was performed of the pelvis to demonstrate the iliac marrow spaces. Maximal barrier sterile technique utilized including caps, mask, sterile gowns, sterile gloves, large sterile drape, hand hygiene, and Betadine prep. Under sterile conditions and local anesthesia, an 11 gauge coaxial bone biopsy needle was advanced into the right iliac marrow space. Needle position was confirmed with CT imaging. Initially, bone marrow aspiration was performed. Next, the 11 gauge outer cannula was utilized to obtain a right iliac bone marrow core biopsy. Needle was removed. Hemostasis was obtained with compression. The patient tolerated the procedure well. Samples were prepared with the cytotechnologist. No immediate complications. IMPRESSION: CT guided right iliac bone marrow aspiration and core biopsy. Electronically Signed   By: Jerilynn Mages.  Shick M.D.   On: 11/17/2022 11:46   CT CHEST ABDOMEN PELVIS W CONTRAST  Result Date: 11/16/2022 CLINICAL DATA:  Metastatic disease. Pancytopenia. Evaluate parotid mass. * Tracking Code: BO * EXAM: CT CHEST, ABDOMEN, AND PELVIS WITH CONTRAST TECHNIQUE: Multidetector CT imaging of the chest, abdomen and pelvis was performed following the standard protocol during  bolus administration of intravenous contrast. RADIATION DOSE REDUCTION: This exam was performed according to the departmental dose-optimization program which includes automated exposure control, adjustment of the mA and/or kV according to patient size and/or use of iterative reconstruction technique. CONTRAST:  12m OMNIPAQUE IOHEXOL 300 MG/ML  SOLN COMPARISON:  None Available. FINDINGS: CT CHEST FINDINGS Cardiovascular: No significant vascular findings. Normal heart size. No pericardial effusion. Mediastinum/Nodes: No axillary or supraclavicular adenopathy. No  mediastinal or hilar adenopathy. No pericardial fluid. Esophagus normal. Lungs/Pleura: Airways normal. No endobronchial lesion. No pulmonary nodules. Musculoskeletal: No aggressive osseous lesion. CT ABDOMEN AND PELVIS FINDINGS Hepatobiliary: No focal hepatic lesion. No biliary ductal dilatation. Gallbladder is normal. Common bile duct is normal. Pancreas: Pancreas is normal. No ductal dilatation. No pancreatic inflammation. Spleen: Normal spleen Adrenals/urinary tract: Adrenal glands and kidneys are normal. Ureters normal. Mild mucosal enhancement within the bladder. Stomach/Bowel: Stomach, small bowel, appendix, and cecum are normal. The colon and rectosigmoid colon are normal. Vascular/Lymphatic: Abdominal aorta is normal caliber. There is no retroperitoneal or periportal lymphadenopathy. No pelvic lymphadenopathy. Reproductive: Asymmetric enhancement in the RIGHT lobe of the prostate gland measures 2.7 x 1.8 cm (image 116/2). Enhancement within the prostatic urethra and corpus callosum of the penis additionally. Other: No free fluid. Musculoskeletal: No aggressive osseous lesion. IMPRESSION: CHEST IMPRESSION: No evidence of thoracic metastasis. PELVIS IMPRESSION: 1. No evidence metastatic disease in the abdomen pelvis. 2. Mild mucosal enhancement of the bladder mucosa, prostatic urethra, corpus callosum, and RIGHT lobe of the prostate gland. Recommend correlation for cystitis/prostatitis with urinalysis. Additionally recommend serum PSA evaluation. Electronically Signed   By: SSuzy BouchardM.D.   On: 11/16/2022 15:07   CT Soft Tissue Neck W Contrast  Result Date: 11/15/2022 CLINICAL DATA:  Palpable mass in the left neck EXAM: CT NECK WITH CONTRAST TECHNIQUE: Multidetector CT imaging of the neck was performed using the standard protocol following the bolus administration of intravenous contrast. RADIATION DOSE REDUCTION: This exam was performed according to the departmental dose-optimization program which  includes automated exposure control, adjustment of the mA and/or kV according to patient size and/or use of iterative reconstruction technique. CONTRAST:  733mOMNIPAQUE IOHEXOL 300 MG/ML  SOLN COMPARISON:  No prior CT neck available, correlation is made with ultrasound 09/26/2022 and CT cervical spine 10/14/2017 FINDINGS: Pharynx and larynx: Normal. No mass or swelling. Salivary glands: Large cystic and solid mass in the left parotid gland, as seen on the recent ultrasound, which measures up to 4.2 x 4.8 x 5.5 cm (AP x TR x CC) (series 7, image 93 and series 6, image 75) in retrospect, this may have been present on the 10/14/2017 CT, where it measured 1.5 x 3.0 x 2.8 cm (AP x TR x CC), but was primarily solid at that time. The right parotid gland and bilateral submandibular glands are unremarkable. Thyroid: Normal. Lymph nodes: Prominent left level 2A lymph node measures up to 1 cm in short axis (series 2, image 45), although this retains normal morphology. No abnormal density lymph nodes. Vascular: Patent. Atherosclerotic calcifications at the bifurcations. Limited intracranial: Negative. Visualized orbits: Negative. Mastoids and visualized paranasal sinuses: Mucosal thickening in the ethmoid air cells. The mastoids are well aerated. Skeleton: No acute osseous abnormality. Periapical lucency about the remaining maxillary and mandibular teeth. Upper chest: No focal pulmonary opacity or pleural effusion. Emphysema. Other: None. IMPRESSION: 1. 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 Tissue sampling is recommended.  2. Prominent left level 2A lymph node, which retains normal morphology. 3. Periapical lucency about the remaining maxillary and mandibular teeth, concerning for apical periodontitis. Correlate with dental exam. Electronically Signed   By: Merilyn Baba M.D.   On: 11/15/2022 21:59

## 2022-11-24 NOTE — Telephone Encounter (Signed)
Called [facility] to scheduled appt for [name] for referral. Appt scheduled on 3/18 at 1 pm arrive at 1230 for appt with Dr. Lissa Merlin. Re faxed referral to [phone], received fax confirmation.  Called [name] at group home and given appt at [facility] on 3/18, given phone # and address at [place]. She verbalized understanding and will take Franklin Ayers to appt.

## 2022-11-24 NOTE — Assessment & Plan Note (Signed)
I reviewed results from bone marrow biopsy and gave a copy to the patient We have sent urgent referral to [facility], appointment is pending next week Unfortunately, we are not able to offer him chemotherapy here at [facility] I have requested cytogenetics and molecular testing with next generation sequencing with myeloid panel for prognostication He is getting more thrombocytopenic but so far he is not symptomatic We discussed close follow-up I will tentatively see him back next Tuesday for further blood transfusion but he is aware that if he is sick, he needs to go to emergency department for urgent evaluation

## 2022-11-24 NOTE — Progress Notes (Signed)
CRITICAL VALUE STICKER  CRITICAL VALUE: [place] 0.1/ Blasts 2  RECEIVER (on-site recipient of call): Maurine Simmering from lab/ Harrel Lemon, [place] NOTIFIED: [phone] 11/24/22  MESSENGER (representative from lab): Rolland Porter  MD NOTIFIED: [name]  TIME OF NOTIFICATION: 11/24/22  1435  RESPONSE: [name].

## 2022-11-24 NOTE — Assessment & Plan Note (Signed)
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

## 2022-11-24 NOTE — Assessment & Plan Note (Signed)
He is neutropenic and we discussed neutropenic precaution He does not need transfusion support right now

## 2022-11-28 ENCOUNTER — Encounter (HOSPITAL_COMMUNITY): Payer: Self-pay | Admitting: Hematology and Oncology

## 2022-11-28 ENCOUNTER — Other Ambulatory Visit: Payer: Self-pay | Admitting: Hematology and Oncology

## 2022-11-28 ENCOUNTER — Telehealth: Payer: Self-pay

## 2022-11-28 DIAGNOSIS — C9200 Acute myeloblastic leukemia, not having achieved remission: Secondary | ICD-10-CM

## 2022-11-28 NOTE — Telephone Encounter (Signed)
Returned call to [facility] and spoke with staff at Dr. Lissa Merlin office. Received call from Mickel Baas, [place] asking if he has appt tomorrow. Left message with staff that yes he has appt.

## 2022-11-29 ENCOUNTER — Encounter: Payer: Self-pay | Admitting: Hematology and Oncology

## 2022-11-29 ENCOUNTER — Inpatient Hospital Stay: Payer: Medicare Other

## 2022-11-29 ENCOUNTER — Inpatient Hospital Stay (HOSPITAL_BASED_OUTPATIENT_CLINIC_OR_DEPARTMENT_OTHER): Payer: Medicare Other | Admitting: Hematology and Oncology

## 2022-11-29 ENCOUNTER — Other Ambulatory Visit: Payer: Self-pay

## 2022-11-29 VITALS — BP 100/65 | HR 69 | Temp 97.8°F | Resp 18 | Ht 68.0 in | Wt 130.2 lb

## 2022-11-29 DIAGNOSIS — D61818 Other pancytopenia: Secondary | ICD-10-CM | POA: Diagnosis not present

## 2022-11-29 DIAGNOSIS — D119 Benign neoplasm of major salivary gland, unspecified: Secondary | ICD-10-CM | POA: Diagnosis not present

## 2022-11-29 DIAGNOSIS — C9200 Acute myeloblastic leukemia, not having achieved remission: Secondary | ICD-10-CM | POA: Diagnosis not present

## 2022-11-29 LAB — CBC WITH DIFFERENTIAL/PLATELET
Abs Immature Granulocytes: 0.01 10*3/uL (ref 0.00–0.07)
Basophils Absolute: 0 10*3/uL (ref 0.0–0.1)
Basophils Relative: 1 %
Eosinophils Absolute: 0.1 10*3/uL (ref 0.0–0.5)
Eosinophils Relative: 3 %
HCT: 30.3 % — ABNORMAL LOW (ref 39.0–52.0)
Hemoglobin: 9.8 g/dL — ABNORMAL LOW (ref 13.0–17.0)
Immature Granulocytes: 1 %
Lymphocytes Relative: 77 %
Lymphs Abs: 1.5 10*3/uL (ref 0.7–4.0)
MCH: 28.3 pg (ref 26.0–34.0)
MCHC: 32.3 g/dL (ref 30.0–36.0)
MCV: 87.6 fL (ref 80.0–100.0)
Monocytes Absolute: 0.2 10*3/uL (ref 0.1–1.0)
Monocytes Relative: 8 %
Neutro Abs: 0.2 10*3/uL — CL (ref 1.7–7.7)
Neutrophils Relative %: 10 %
Platelets: 17 10*3/uL — ABNORMAL LOW (ref 150–400)
RBC: 3.46 MIL/uL — ABNORMAL LOW (ref 4.22–5.81)
RDW: 18.7 % — ABNORMAL HIGH (ref 11.5–15.5)
WBC: 2 10*3/uL — ABNORMAL LOW (ref 4.0–10.5)
nRBC: 2 % — ABNORMAL HIGH (ref 0.0–0.2)

## 2022-11-29 LAB — SAMPLE TO BLOOD BANK

## 2022-11-29 NOTE — Progress Notes (Signed)
Franklin Ayers OFFICE PROGRESS NOTE  Patient Care Team: Pcp, No as PCP - General  ASSESSMENT & PLAN:  AML (acute myeloid leukemia) ([employer]) I was not able to reviewed consultation from yesterday Additional test from his bone marrow biopsy is back; I gave the patient and caregiver a copy of cytogenetics report The patient has complex cytogenetics. Next generation sequencing for AML prognostic markers are still pending I recommend the patient to consider inpatient admission to the hospital at [facility] for induction chemotherapy as soon as possible I told the patient I am not able to provide 3 times a week blood count monitoring and transfusion for him due to resource limitations The patient needs to go through entire treatment at [facility] for his best chance for cure given complex cytogenetics results His sister is not available I discussed this extensively with caregiver and they are in agreement  Pancytopenia [facility]) He is not symptomatic right now He does not need transfusion support  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   All questions were answered. The patient knows to call the clinic with any problems, questions or concerns. The total time spent in the appointment was 30 minutes encounter with patients including review of chart and various tests results, discussions about plan of care and coordination of care plan   Heath Lark, MD 11/29/2022 2:50 PM  INTERVAL HISTORY: Please see below for problem oriented charting. he returns for further follow-up with caregiver His sister is not present I also spoke with the group home director Apparently, the patient was seen accompanied by another caregiver He was given the choice whether he would prefer outpatient therapy versus inpatient treatment The patient chose for outpatient therapy The  exact treatment plan was not clear I was not able to review recommendation from [facility] In the meantime, he denies recent bleeding, fever or pain. We discussed additional test results; I gave the patient and caregiver a copy of his cytogenetics report I recommend the patient to receive aggressive treatment at [place]:   Constitutional: Denies fevers, chills or abnormal weight loss Eyes: Denies blurriness of vision Ears, nose, mouth, throat, and face: Denies mucositis or sore throat Respiratory: Denies cough, dyspnea or wheezes Cardiovascular: Denies palpitation, chest discomfort or lower extremity swelling Gastrointestinal:  Denies nausea, heartburn or change in bowel habits Skin: Denies abnormal skin rashes Lymphatics: Denies new lymphadenopathy or easy bruising Neurological:Denies numbness, tingling or new weaknesses Behavioral/Psych: Mood is stable, no new changes  All other systems were reviewed with the patient and are negative.  I have reviewed the past medical history, past surgical history, social history and family history with the patient and they are unchanged from previous note.  ALLERGIES:  is allergic to penicillins.  MEDICATIONS:  Current Outpatient Medications  Medication Sig Dispense Refill   albuterol (VENTOLIN HFA) 108 (90 Base) MCG/ACT inhaler Inhale 2 puffs into the lungs every 6 (six) hours as needed for wheezing or shortness of breath.     cyanocobalamin (VITAMIN B12) 1000 MCG tablet Take 1 tablet (1,000 mcg total) by mouth daily. 30 tablet 2   LORazepam (ATIVAN) 0.5 MG tablet Take 1 tablet (0.5 mg total) by mouth 2 (two) times daily. 60 tablet 0   OLANZapine zydis (ZYPREXA) 20 MG disintegrating tablet Take 20 mg by mouth daily.  tamsulosin (FLOMAX) 0.4 MG CAPS capsule Take 2 capsules (0.8 mg total) by mouth daily. (Patient taking differently: Take 0.4 mg by mouth daily.) 30 capsule    No current facility-administered medications  for this visit.    SUMMARY OF ONCOLOGIC HISTORY: Oncology History Overview Note  Complex cytogenetics   AML (acute myeloid leukemia) ([place])  11/15/2022 Imaging   CT neck 1. 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 Tissue sampling is recommended. 2. Prominent left level 2A lymph node, which retains normal morphology. 3. Periapical lucency about the remaining maxillary and mandibular teeth, concerning for apical periodontitis. Correlate with dental exam.   11/16/2022 Initial Diagnosis   Franklin Ayers 62 y.o. male is admitted to the hospital due to enlarging left parotid mass and severe pancytopenia.  He was seen on 11/16/2022 after admission to the hospital to expedite workup. The patient lives in a group home He has significant psychiatric history He has been a smoker for over 30 years He has noted enlarging left sided parotid mass for many months He was sent to the emergency department approximately in January 2024 and subsequently discharged He was sent back to the emergency department yesterday due to worsening pancytopenia and enlarging left sided parotid mass Blood work confirms severe pancytopenia CT imaging showed large mass suspicious for malignancy He is being admitted for further investigations He has noted recurrent epistaxis on a regular basis for several months Denies hematuria or hematochezia Denies difficulties with swallowing or voice changes No recent weight loss Denies alcohol intake   11/16/2022 Imaging   CHEST IMPRESSION:   No evidence of thoracic metastasis.   PELVIS IMPRESSION:   1. No evidence metastatic disease in the abdomen pelvis. 2. Mild mucosal enhancement of the bladder mucosa, prostatic urethra, corpus callosum, and RIGHT lobe of the prostate gland. Recommend correlation for cystitis/prostatitis with urinalysis. Additionally recommend serum PSA evaluation.      11/17/2022 Bone Marrow Biopsy   Surgical Pathology  CASE: [id number]  PATIENT: Franklin Ayers  Bone Marrow Report   Clinical History: Pancytopenia ([place])   DIAGNOSIS:   BONE MARROW, ASPIRATE, CLOT, CORE:  -Hypercellular bone marrow with acute myeloid leukemia  -See comment   PERIPHERAL BLOOD:  -Pancytopenia with circulating blasts   COMMENT:   The acute leukemic process appears to be arising in a background of dyspoietic changes.  Correlation with cytogenetic and molecular studies is recommended.    11/17/2022 Pathology Results   A. PAROTID, LEFT, BIOPSY:  -  Scattered fragments of squamous epithelium with mild atypia and cystic dilatation with associated adjacent abundant chronic inflammatory cells/lymphoid-type tissue, see note  -  Separate fragments of unremarkable salivary gland tissue.   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  Given the mild cytologic atypia and fragmented nature of the specimen, however, a well-differentiated squamous cell carcinoma with cystic appearance cannot be completely excluded, but this is considered much less likely. It is also noted that there is a concurrent bone marrow, there is no evidence of a hematolymphoid malignancy present in the specimen.  Clinical/radiologic correlation recommended.      11/24/2022 Initial Diagnosis   AML (acute myeloid leukemia) ([place])     PHYSICAL EXAMINATION: ECOG PERFORMANCE STATUS: 1 - Symptomatic but completely ambulatory  Vitals:   11/29/22 1342  BP: 100/65  Pulse: 69  Resp: 18  Temp: 97.8 F (36.6 C)  SpO2: 100%   Filed Weights   11/29/22 1342  Weight: 130 lb 3 oz (59.1 kg)    GENERAL:alert, no distress and comfortable NEURO: alert & oriented x 3 with fluent speech, no focal motor/sensory deficits  LABORATORY DATA:  I have reviewed the data as listed    Component Value Date/Time   NA 134 (L) 11/17/2022  0351   NA 138 11/16/2017 0000   K 3.7 11/17/2022 0351   CL 101 11/17/2022 0351   CO2 26 11/17/2022 0351   GLUCOSE 93 11/17/2022 0351   BUN 13 11/17/2022 0351   BUN 13 11/16/2017 0000   CREATININE 0.61 11/17/2022 0351   CALCIUM 8.6 (L) 11/17/2022 0351   PROT 7.3 11/17/2022 0351   ALBUMIN 3.2 (L) 11/17/2022 0351   AST 12 (L) 11/17/2022 0351   ALT 8 11/17/2022 0351   ALKPHOS 60 11/17/2022 0351   BILITOT 0.8 11/17/2022 0351   GFRNONAA >60 11/17/2022 0351   GFRAA >60 06/15/2019 1404    No results found for: "SPEP", "UPEP"  Lab Results  Component Value Date   WBC 2.0 (L) 11/29/2022   NEUTROABS 0.2 (LL) 11/29/2022   HGB 9.8 (L) 11/29/2022   HCT 30.3 (L) 11/29/2022   MCV 87.6 11/29/2022   PLT 17 (L) 11/29/2022      Chemistry      Component Value Date/Time   NA 134 (L) 11/17/2022 0351   NA 138 11/16/2017 0000   K 3.7 11/17/2022 0351   CL 101 11/17/2022 0351   CO2 26 11/17/2022 0351   BUN 13 11/17/2022 0351   BUN 13 11/16/2017 0000   CREATININE 0.61 11/17/2022 0351   GLU 92 11/16/2017 0000      Component Value Date/Time   CALCIUM 8.6 (L) 11/17/2022 0351   ALKPHOS 60 11/17/2022 0351   AST 12 (L) 11/17/2022 0351   ALT 8 11/17/2022 0351   BILITOT 0.8 11/17/2022 0351       RADIOGRAPHIC STUDIES: I have personally reviewed the radiological images as listed and agreed with the findings in the report. [place] CORE BIOPSY (SOFT TISSUE)  Result Date: 11/17/2022 INDICATION: ENLARGING LEFT PAROTID MIXED CYSTIC/SOLID MASS EXAM: ULTRASOUND ASPIRATION AND CORE BIOPSY OF THE LEFT PAROTID MASS MEDICATIONS: 1% LIDOCAINE LOCAL ANESTHESIA/SEDATION: Moderate (conscious) sedation was employed during this procedure. A total of Versed 1 mg and Fentanyl 50 mcg was administered intravenously by the radiology nurse. Total intra-service moderate Sedation Time: 10 minutes. The patient's level of consciousness and vital signs were monitored continuously by radiology nursing throughout the procedure  under my direct supervision. COMPLICATIONS: None immediate. PROCEDURE: Informed written consent was obtained from the patient after a thorough discussion of the procedural risks, benefits and alternatives. All questions were addressed. Maximal Sterile Barrier Technique was utilized including caps, mask, sterile gowns, sterile gloves, sterile drape, hand hygiene and skin antiseptic. A timeout was performed prior to the initiation of the procedure. Previous imaging reviewed. Preliminary ultrasound performed. The left parotid cystic/solid mass was ultrasound and marked for biopsy. Ultrasound aspiration: Initially, under sterile conditions and local anesthesia, an 18 gauge needle was advanced into the dominant cystic portion of the lesion. Syringe aspiration yielded 30 cc blood tinged cellular debris-filled fluid. Sample sent for cytology. This collapse the majority of the cystic components. Ultrasound core biopsy: Also under sterile conditions and local anesthesia, an 18 gauge biopsy was advanced to the residual solid component. Several 18 gauge core biopsies obtained. Samples were placed in saline. Images obtained for documentation. No immediate complication. Patient  tolerated the procedure well. IMPRESSION: Successful ultrasound aspiration of the left parotid mass for cytology Successful ultrasound core biopsy of the left parotid mass for surgical pathology. Electronically Signed   By: Jerilynn Mages.  Shick M.D.   On: 11/17/2022 11:53   [place] FNA SALIVARY GLAND/PAROTID GLAND  Result Date: 11/17/2022 INDICATION: ENLARGING LEFT PAROTID MIXED CYSTIC/SOLID MASS EXAM: ULTRASOUND ASPIRATION AND CORE BIOPSY OF THE LEFT PAROTID MASS MEDICATIONS: 1% LIDOCAINE LOCAL ANESTHESIA/SEDATION: Moderate (conscious) sedation was employed during this procedure. A total of Versed 1 mg and Fentanyl 50 mcg was administered intravenously by the radiology nurse. Total intra-service moderate Sedation Time: 10 minutes. The patient's level of consciousness  and vital signs were monitored continuously by radiology nursing throughout the procedure under my direct supervision. COMPLICATIONS: None immediate. PROCEDURE: Informed written consent was obtained from the patient after a thorough discussion of the procedural risks, benefits and alternatives. All questions were addressed. Maximal Sterile Barrier Technique was utilized including caps, mask, sterile gowns, sterile gloves, sterile drape, hand hygiene and skin antiseptic. A timeout was performed prior to the initiation of the procedure. Previous imaging reviewed. Preliminary ultrasound performed. The left parotid cystic/solid mass was ultrasound and marked for biopsy. Ultrasound aspiration: Initially, under sterile conditions and local anesthesia, an 18 gauge needle was advanced into the dominant cystic portion of the lesion. Syringe aspiration yielded 30 cc blood tinged cellular debris-filled fluid. Sample sent for cytology. This collapse the majority of the cystic components. Ultrasound core biopsy: Also under sterile conditions and local anesthesia, an 18 gauge biopsy was advanced to the residual solid component. Several 18 gauge core biopsies obtained. Samples were placed in saline. Images obtained for documentation. No immediate complication. Patient tolerated the procedure well. IMPRESSION: Successful ultrasound aspiration of the left parotid mass for cytology Successful ultrasound core biopsy of the left parotid mass for surgical pathology. Electronically Signed   By: Jerilynn Mages.  Shick M.D.   On: 11/17/2022 11:53   CT BONE MARROW BIOPSY & ASPIRATION  Result Date: 11/17/2022 INDICATION: PANCYTOPENIA EXAM: CT GUIDED RIGHT ILIAC BONE MARROW ASPIRATION AND CORE BIOPSY Date:  11/17/2022 11/17/2022 10:20 am Radiologist:  M. Daryll Brod, MD Guidance:  CT FLUOROSCOPY: Fluoroscopy Time: None. MEDICATIONS: 1% lidocaine ANESTHESIA/SEDATION: 2.0 mg IV Versed; 100 mcg IV Fentanyl Moderate Sedation Time:  10 minute The patient was  continuously monitored during the procedure by the interventional radiology nurse under my direct supervision. CONTRAST:  None. COMPLICATIONS: None PROCEDURE: Informed consent was obtained from the patient following explanation of the procedure, risks, benefits and alternatives. The patient understands, agrees and consents for the procedure. All questions were addressed. A time out was performed. The patient was positioned prone and non-contrast localization CT was performed of the pelvis to demonstrate the iliac marrow spaces. Maximal barrier sterile technique utilized including caps, mask, sterile gowns, sterile gloves, large sterile drape, hand hygiene, and Betadine prep. Under sterile conditions and local anesthesia, an 11 gauge coaxial bone biopsy needle was advanced into the right iliac marrow space. Needle position was confirmed with CT imaging. Initially, bone marrow aspiration was performed. Next, the 11 gauge outer cannula was utilized to obtain a right iliac bone marrow core biopsy. Needle was removed. Hemostasis was obtained with compression. The patient tolerated the procedure well. Samples were prepared with the cytotechnologist. No immediate complications. IMPRESSION: CT guided right iliac bone marrow aspiration and core biopsy. Electronically Signed   By: Jerilynn Mages.  Shick M.D.   On: 11/17/2022 11:46   CT CHEST ABDOMEN PELVIS W CONTRAST  Result Date:  11/16/2022 CLINICAL DATA:  Metastatic disease. Pancytopenia. Evaluate parotid mass. * Tracking Code: BO * EXAM: CT CHEST, ABDOMEN, AND PELVIS WITH CONTRAST TECHNIQUE: Multidetector CT imaging of the chest, abdomen and pelvis was performed following the standard protocol during bolus administration of intravenous contrast. RADIATION DOSE REDUCTION: This exam was performed according to the departmental dose-optimization program which includes automated exposure control, adjustment of the mA and/or kV according to patient size and/or use of iterative  reconstruction technique. CONTRAST:  137mL OMNIPAQUE IOHEXOL 300 MG/ML  SOLN COMPARISON:  None Available. FINDINGS: CT CHEST FINDINGS Cardiovascular: No significant vascular findings. Normal heart size. No pericardial effusion. Mediastinum/Nodes: No axillary or supraclavicular adenopathy. No mediastinal or hilar adenopathy. No pericardial fluid. Esophagus normal. Lungs/Pleura: Airways normal. No endobronchial lesion. No pulmonary nodules. Musculoskeletal: No aggressive osseous lesion. CT ABDOMEN AND PELVIS FINDINGS Hepatobiliary: No focal hepatic lesion. No biliary ductal dilatation. Gallbladder is normal. Common bile duct is normal. Pancreas: Pancreas is normal. No ductal dilatation. No pancreatic inflammation. Spleen: Normal spleen Adrenals/urinary tract: Adrenal glands and kidneys are normal. Ureters normal. Mild mucosal enhancement within the bladder. Stomach/Bowel: Stomach, small bowel, appendix, and cecum are normal. The colon and rectosigmoid colon are normal. Vascular/Lymphatic: Abdominal aorta is normal caliber. There is no retroperitoneal or periportal lymphadenopathy. No pelvic lymphadenopathy. Reproductive: Asymmetric enhancement in the RIGHT lobe of the prostate gland measures 2.7 x 1.8 cm (image 116/2). Enhancement within the prostatic urethra and corpus callosum of the penis additionally. Other: No free fluid. Musculoskeletal: No aggressive osseous lesion. IMPRESSION: CHEST IMPRESSION: No evidence of thoracic metastasis. PELVIS IMPRESSION: 1. No evidence metastatic disease in the abdomen pelvis. 2. Mild mucosal enhancement of the bladder mucosa, prostatic urethra, corpus callosum, and RIGHT lobe of the prostate gland. Recommend correlation for cystitis/prostatitis with urinalysis. Additionally recommend serum PSA evaluation. Electronically Signed   By: Suzy Bouchard M.D.   On: 11/16/2022 15:07   CT Soft Tissue Neck W Contrast  Result Date: 11/15/2022 CLINICAL DATA:  Palpable mass in the left  neck EXAM: CT NECK WITH CONTRAST TECHNIQUE: Multidetector CT imaging of the neck was performed using the standard protocol following the bolus administration of intravenous contrast. RADIATION DOSE REDUCTION: This exam was performed according to the departmental dose-optimization program which includes automated exposure control, adjustment of the mA and/or kV according to patient size and/or use of iterative reconstruction technique. CONTRAST:  79mL OMNIPAQUE IOHEXOL 300 MG/ML  SOLN COMPARISON:  No prior CT neck available, correlation is made with ultrasound 09/26/2022 and CT cervical spine 10/14/2017 FINDINGS: Pharynx and larynx: Normal. No mass or swelling. Salivary glands: Large cystic and solid mass in the left parotid gland, as seen on the recent ultrasound, which measures up to 4.2 x 4.8 x 5.5 cm (AP x TR x CC) (series 7, image 93 and series 6, image 75) in retrospect, this may have been present on the 10/14/2017 CT, where it measured 1.5 x 3.0 x 2.8 cm (AP x TR x CC), but was primarily solid at that time. The right parotid gland and bilateral submandibular glands are unremarkable. Thyroid: Normal. Lymph nodes: Prominent left level 2A lymph node measures up to 1 cm in short axis (series 2, image 45), although this retains normal morphology. No abnormal density lymph nodes. Vascular: Patent. Atherosclerotic calcifications at the bifurcations. Limited intracranial: Negative. Visualized orbits: Negative. Mastoids and visualized paranasal sinuses: Mucosal thickening in the ethmoid air cells. The mastoids are well aerated. Skeleton: No acute osseous abnormality. Periapical lucency about the remaining maxillary and mandibular teeth.  Upper chest: No focal pulmonary opacity or pleural effusion. Emphysema. Other: None. IMPRESSION: 1. 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 Tissue sampling is recommended. 2. Prominent left level 2A  lymph node, which retains normal morphology. 3. Periapical lucency about the remaining maxillary and mandibular teeth, concerning for apical periodontitis. Correlate with dental exam. Electronically Signed   By: Merilyn Baba M.D.   On: 11/15/2022 21:59

## 2022-11-29 NOTE — Assessment & Plan Note (Signed)
I am hopeful he can get ENT consult while admitted at [facility] for further management

## 2022-11-29 NOTE — Assessment & Plan Note (Signed)
He is not symptomatic right now He does not need transfusion support

## 2022-11-29 NOTE — Progress Notes (Signed)
CRITICAL VALUE STICKER  CRITICAL VALUE: ANC 0.2  RECEIVER (on-site recipient of call): Harrel Lemon  DATE & TIME NOTIFIED: 11/29/22 1411  MESSENGER (representative from lab): Billee Cashing  MD NOTIFIED: [name]  TIME OF NOTIFICATION:11/29/22 at 1417  RESPONSE:  Will review

## 2022-11-29 NOTE — Assessment & Plan Note (Signed)
I was not able to reviewed consultation from yesterday Additional test from his bone marrow biopsy is back; I gave the patient and caregiver a copy of cytogenetics report The patient has complex cytogenetics. Next generation sequencing for AML prognostic markers are still pending I recommend the patient to consider inpatient admission to the hospital at [facility] for induction chemotherapy as soon as possible I told the patient I am not able to provide 3 times a week blood count monitoring and transfusion for him due to resource limitations The patient needs to go through entire treatment at [facility] for his best chance for cure given complex cytogenetics results His sister is not available I discussed this extensively with caregiver and they are in agreement

## 2022-11-30 ENCOUNTER — Encounter (HOSPITAL_COMMUNITY): Payer: Self-pay | Admitting: Hematology and Oncology

## 2022-12-01 ENCOUNTER — Encounter (HOSPITAL_COMMUNITY): Payer: Self-pay | Admitting: Hematology and Oncology

## 2022-12-02 ENCOUNTER — Telehealth: Payer: Self-pay

## 2022-12-02 ENCOUNTER — Encounter (HOSPITAL_COMMUNITY): Payer: Self-pay | Admitting: Hematology and Oncology

## 2022-12-02 NOTE — Telephone Encounter (Signed)
Faxed requested Molecular genetics to [facility] to Dr. Lissa Merlin at [phone]. Received fax confirmation.

## 2022-12-05 ENCOUNTER — Encounter (HOSPITAL_COMMUNITY): Payer: Self-pay | Admitting: Hematology and Oncology

## 2023-08-13 DEATH — deceased
